# Patient Record
Sex: Female | Born: 1940 | Race: White | Hispanic: No | Marital: Married | Smoking: Never smoker
Health system: Southern US, Community
[De-identification: ages and names within clinical notes are randomized; demographics above are authoritative.]

## PROBLEM LIST (undated history)

## (undated) DIAGNOSIS — I4891 Unspecified atrial fibrillation: Secondary | ICD-10-CM

## (undated) DIAGNOSIS — H259 Unspecified age-related cataract: Secondary | ICD-10-CM

## (undated) DIAGNOSIS — C50919 Malignant neoplasm of unspecified site of unspecified female breast: Secondary | ICD-10-CM

## (undated) DIAGNOSIS — J302 Other seasonal allergic rhinitis: Secondary | ICD-10-CM

## (undated) DIAGNOSIS — I10 Essential (primary) hypertension: Secondary | ICD-10-CM

## (undated) DIAGNOSIS — M810 Age-related osteoporosis without current pathological fracture: Secondary | ICD-10-CM

## (undated) DIAGNOSIS — M1990 Unspecified osteoarthritis, unspecified site: Secondary | ICD-10-CM

## (undated) DIAGNOSIS — D649 Anemia, unspecified: Secondary | ICD-10-CM

## (undated) DIAGNOSIS — C801 Malignant (primary) neoplasm, unspecified: Secondary | ICD-10-CM

## (undated) DIAGNOSIS — M5030 Other cervical disc degeneration, unspecified cervical region: Secondary | ICD-10-CM

## (undated) DIAGNOSIS — I700 Atherosclerosis of aorta: Secondary | ICD-10-CM

## (undated) DIAGNOSIS — E785 Hyperlipidemia, unspecified: Secondary | ICD-10-CM

## (undated) DIAGNOSIS — I4819 Other persistent atrial fibrillation: Secondary | ICD-10-CM

## (undated) DIAGNOSIS — M48062 Spinal stenosis, lumbar region with neurogenic claudication: Secondary | ICD-10-CM

## (undated) DIAGNOSIS — R002 Palpitations: Secondary | ICD-10-CM

## (undated) DIAGNOSIS — I447 Left bundle-branch block, unspecified: Secondary | ICD-10-CM

## (undated) DIAGNOSIS — I38 Endocarditis, valve unspecified: Secondary | ICD-10-CM

## (undated) DIAGNOSIS — N183 Chronic kidney disease, stage 3 unspecified: Secondary | ICD-10-CM

## (undated) DIAGNOSIS — I341 Nonrheumatic mitral (valve) prolapse: Secondary | ICD-10-CM

## (undated) DIAGNOSIS — I5020 Unspecified systolic (congestive) heart failure: Secondary | ICD-10-CM

## (undated) DIAGNOSIS — I6789 Other cerebrovascular disease: Secondary | ICD-10-CM

## (undated) DIAGNOSIS — Z7901 Long term (current) use of anticoagulants: Secondary | ICD-10-CM

## (undated) DIAGNOSIS — Z79899 Other long term (current) drug therapy: Secondary | ICD-10-CM

## (undated) DIAGNOSIS — N3281 Overactive bladder: Secondary | ICD-10-CM

## (undated) HISTORY — PX: EYE SURGERY: SHX253

## (undated) HISTORY — PX: JOINT REPLACEMENT: SHX530

## (undated) HISTORY — PX: CHOLECYSTECTOMY: SHX55

## (undated) HISTORY — PX: ABDOMINAL HYSTERECTOMY: SHX81

## (undated) HISTORY — DX: Malignant neoplasm of unspecified site of unspecified female breast: C50.919

## (undated) HISTORY — PX: MASTECTOMY: SHX3

---

## 2006-08-09 ENCOUNTER — Ambulatory Visit: Payer: Self-pay | Admitting: Family Medicine

## 2010-12-04 ENCOUNTER — Ambulatory Visit: Payer: Self-pay | Admitting: Family Medicine

## 2010-12-05 ENCOUNTER — Ambulatory Visit: Payer: Self-pay | Admitting: Family Medicine

## 2010-12-15 ENCOUNTER — Ambulatory Visit: Payer: Self-pay | Admitting: Surgery

## 2010-12-22 LAB — PATHOLOGY REPORT

## 2010-12-23 ENCOUNTER — Ambulatory Visit: Payer: Self-pay | Admitting: Oncology

## 2010-12-25 ENCOUNTER — Ambulatory Visit: Payer: Self-pay | Admitting: Oncology

## 2011-01-10 ENCOUNTER — Ambulatory Visit: Payer: Self-pay | Admitting: Oncology

## 2011-01-13 ENCOUNTER — Ambulatory Visit: Payer: Self-pay | Admitting: Surgery

## 2011-01-14 ENCOUNTER — Ambulatory Visit: Payer: Self-pay | Admitting: Oncology

## 2011-02-09 ENCOUNTER — Ambulatory Visit: Payer: Self-pay | Admitting: Oncology

## 2011-03-12 ENCOUNTER — Ambulatory Visit: Payer: Self-pay | Admitting: Oncology

## 2011-04-11 ENCOUNTER — Ambulatory Visit: Payer: Self-pay | Admitting: Oncology

## 2011-05-12 ENCOUNTER — Ambulatory Visit: Payer: Self-pay | Admitting: Oncology

## 2011-05-13 LAB — CBC CANCER CENTER
Basophil #: 0 x10 3/mm (ref 0.0–0.1)
Basophil %: 0.9 %
Eosinophil #: 0.1 x10 3/mm (ref 0.0–0.7)
HCT: 27.3 % — ABNORMAL LOW (ref 35.0–47.0)
HGB: 9.5 g/dL — ABNORMAL LOW (ref 12.0–16.0)
MCH: 34.8 pg — ABNORMAL HIGH (ref 26.0–34.0)
MCHC: 34.7 g/dL (ref 32.0–36.0)
MCV: 100.4 fL — ABNORMAL HIGH (ref 80–100)
Monocyte #: 0.2 x10 3/mm (ref 0.0–0.7)
Neutrophil #: 1.8 x10 3/mm (ref 1.4–6.5)
RDW: 17.8 % — ABNORMAL HIGH (ref 11.5–14.5)

## 2011-05-13 LAB — COMPREHENSIVE METABOLIC PANEL
Albumin: 3.5 g/dL (ref 3.4–5.0)
Alkaline Phosphatase: 120 U/L (ref 50–136)
BUN: 9 mg/dL (ref 7–18)
Chloride: 102 mmol/L (ref 98–107)
Co2: 24 mmol/L (ref 21–32)
Glucose: 132 mg/dL — ABNORMAL HIGH (ref 65–99)
Potassium: 3.2 mmol/L — ABNORMAL LOW (ref 3.5–5.1)
SGOT(AST): 23 U/L (ref 15–37)
Sodium: 136 mmol/L (ref 136–145)
Total Protein: 6.6 g/dL (ref 6.4–8.2)

## 2011-05-20 LAB — CBC CANCER CENTER
Basophil %: 0.9 %
Eosinophil #: 0 x10 3/mm (ref 0.0–0.7)
Eosinophil %: 1.7 %
HCT: 26.5 % — ABNORMAL LOW (ref 35.0–47.0)
HGB: 9.1 g/dL — ABNORMAL LOW (ref 12.0–16.0)
Lymphocyte %: 16.8 %
MCHC: 34.3 g/dL (ref 32.0–36.0)
Neutrophil #: 1.8 x10 3/mm (ref 1.4–6.5)
Neutrophil %: 67.3 %
Platelet: 104 x10 3/mm — ABNORMAL LOW (ref 150–440)
RBC: 2.61 10*6/uL — ABNORMAL LOW (ref 3.80–5.20)
WBC: 2.5 x10 3/mm — ABNORMAL LOW (ref 3.6–11.0)

## 2011-05-27 LAB — CBC CANCER CENTER
Basophil #: 0 x10 3/mm (ref 0.0–0.1)
Eosinophil %: 0.8 %
HCT: 25.3 % — ABNORMAL LOW (ref 35.0–47.0)
Lymphocyte #: 0.5 x10 3/mm — ABNORMAL LOW (ref 1.0–3.6)
MCH: 35.4 pg — ABNORMAL HIGH (ref 26.0–34.0)
MCV: 100.8 fL — ABNORMAL HIGH (ref 80–100)
Monocyte %: 10.4 %
Neutrophil #: 2.1 x10 3/mm (ref 1.4–6.5)
Platelet: 103 x10 3/mm — ABNORMAL LOW (ref 150–440)
RDW: 17.2 % — ABNORMAL HIGH (ref 11.5–14.5)
WBC: 2.9 x10 3/mm — ABNORMAL LOW (ref 3.6–11.0)

## 2011-06-03 LAB — CBC CANCER CENTER
Basophil #: 0 x10 3/mm (ref 0.0–0.1)
Eosinophil %: 1.4 %
HCT: 27.7 % — ABNORMAL LOW (ref 35.0–47.0)
HGB: 9.5 g/dL — ABNORMAL LOW (ref 12.0–16.0)
Lymphocyte #: 0.5 x10 3/mm — ABNORMAL LOW (ref 1.0–3.6)
Lymphocyte %: 19 %
MCV: 99.9 fL (ref 80–100)
Monocyte %: 8.3 %
Neutrophil #: 1.9 x10 3/mm (ref 1.4–6.5)
Platelet: 138 x10 3/mm — ABNORMAL LOW (ref 150–440)
RBC: 2.77 10*6/uL — ABNORMAL LOW (ref 3.80–5.20)
WBC: 2.6 x10 3/mm — ABNORMAL LOW (ref 3.6–11.0)

## 2011-06-10 LAB — COMPREHENSIVE METABOLIC PANEL
Albumin: 3.5 g/dL (ref 3.4–5.0)
Alkaline Phosphatase: 131 U/L (ref 50–136)
Anion Gap: 13 (ref 7–16)
BUN: 15 mg/dL (ref 7–18)
Calcium, Total: 8.7 mg/dL (ref 8.5–10.1)
Chloride: 102 mmol/L (ref 98–107)
Co2: 24 mmol/L (ref 21–32)
EGFR (Non-African Amer.): 58 — ABNORMAL LOW
Potassium: 3.3 mmol/L — ABNORMAL LOW (ref 3.5–5.1)
SGOT(AST): 23 U/L (ref 15–37)

## 2011-06-10 LAB — CBC CANCER CENTER
Basophil #: 0 x10 3/mm (ref 0.0–0.1)
Basophil %: 1.2 %
Eosinophil #: 0 x10 3/mm (ref 0.0–0.7)
Eosinophil %: 2.1 %
HCT: 28.5 % — ABNORMAL LOW (ref 35.0–47.0)
HGB: 9.5 g/dL — ABNORMAL LOW (ref 12.0–16.0)
Lymphocyte #: 0.6 x10 3/mm — ABNORMAL LOW (ref 1.0–3.6)
Lymphocyte %: 24.2 %
MCHC: 33.3 g/dL (ref 32.0–36.0)
MCV: 101.2 fL — ABNORMAL HIGH (ref 80–100)
Monocyte #: 0.2 x10 3/mm (ref 0.0–0.7)
Neutrophil #: 1.6 x10 3/mm (ref 1.4–6.5)
Neutrophil %: 65.1 %
Platelet: 128 x10 3/mm — ABNORMAL LOW (ref 150–440)
RDW: 17.3 % — ABNORMAL HIGH (ref 11.5–14.5)
WBC: 2.4 x10 3/mm — ABNORMAL LOW (ref 3.6–11.0)

## 2011-06-12 ENCOUNTER — Ambulatory Visit: Payer: Self-pay | Admitting: Oncology

## 2011-06-17 LAB — COMPREHENSIVE METABOLIC PANEL
Albumin: 3.5 g/dL (ref 3.4–5.0)
Anion Gap: 10 (ref 7–16)
Bilirubin,Total: 0.6 mg/dL (ref 0.2–1.0)
Calcium, Total: 8.8 mg/dL (ref 8.5–10.1)
Chloride: 104 mmol/L (ref 98–107)
Co2: 26 mmol/L (ref 21–32)
Creatinine: 0.82 mg/dL (ref 0.60–1.30)
EGFR (African American): >60
Glucose: 135 mg/dL — ABNORMAL HIGH (ref 65–99)
Osmolality: 281 (ref 275–301)
Potassium: 3.9 mmol/L (ref 3.5–5.1)
SGOT(AST): 23 U/L (ref 15–37)
Sodium: 140 mmol/L (ref 136–145)
Total Protein: 6.7 g/dL (ref 6.4–8.2)

## 2011-06-17 LAB — CBC CANCER CENTER
Basophil #: 0 x10 3/mm (ref 0.0–0.1)
Basophil %: 1 %
HGB: 9.6 g/dL — ABNORMAL LOW (ref 12.0–16.0)
Lymphocyte #: 0.7 x10 3/mm — ABNORMAL LOW (ref 1.0–3.6)
MCV: 102.1 fL — ABNORMAL HIGH (ref 80–100)
Monocyte %: 10.7 %
Neutrophil #: 1.7 x10 3/mm (ref 1.4–6.5)
Platelet: 133 x10 3/mm — ABNORMAL LOW (ref 150–440)
RBC: 2.86 10*6/uL — ABNORMAL LOW (ref 3.80–5.20)
RDW: 18.4 % — ABNORMAL HIGH (ref 11.5–14.5)
WBC: 2.7 x10 3/mm — ABNORMAL LOW (ref 3.6–11.0)

## 2011-06-24 LAB — CBC CANCER CENTER
Basophil %: 1 %
Eosinophil #: 0 x10 3/mm (ref 0.0–0.7)
Eosinophil %: 1.3 %
HGB: 9.3 g/dL — ABNORMAL LOW (ref 12.0–16.0)
Lymphocyte #: 0.5 x10 3/mm — ABNORMAL LOW (ref 1.0–3.6)
Lymphocyte %: 19.2 %
MCH: 33 pg (ref 26.0–34.0)
MCV: 101.3 fL — ABNORMAL HIGH (ref 80–100)
Monocyte #: 0.2 x10 3/mm (ref 0.0–0.7)
Monocyte %: 8.2 %
Neutrophil #: 1.9 x10 3/mm (ref 1.4–6.5)
Neutrophil %: 70.3 %
RBC: 2.83 10*6/uL — ABNORMAL LOW (ref 3.80–5.20)
RDW: 17.6 % — ABNORMAL HIGH (ref 11.5–14.5)

## 2011-06-24 LAB — COMPREHENSIVE METABOLIC PANEL
Albumin: 3.5 g/dL (ref 3.4–5.0)
Alkaline Phosphatase: 132 U/L (ref 50–136)
Calcium, Total: 8.6 mg/dL (ref 8.5–10.1)
Creatinine: 0.88 mg/dL (ref 0.60–1.30)
Osmolality: 279 (ref 275–301)
Sodium: 139 mmol/L (ref 136–145)

## 2011-07-10 ENCOUNTER — Ambulatory Visit: Payer: Self-pay | Admitting: Oncology

## 2011-07-23 ENCOUNTER — Ambulatory Visit: Payer: Self-pay | Admitting: Surgery

## 2011-07-23 LAB — BASIC METABOLIC PANEL
BUN: 14 mg/dL (ref 7–18)
Creatinine: 0.69 mg/dL (ref 0.60–1.30)
EGFR (African American): >60
EGFR (Non-African Amer.): >60
Glucose: 93 mg/dL (ref 65–99)
Potassium: 3.8 mmol/L (ref 3.5–5.1)
Sodium: 139 mmol/L (ref 136–145)

## 2011-07-23 LAB — CBC WITH DIFFERENTIAL/PLATELET
MCH: 33 pg (ref 26.0–34.0)
MCHC: 33.5 g/dL (ref 32.0–36.0)
Monocyte #: 0.6 10*3/uL (ref 0.0–0.7)
Neutrophil %: 73.3 %
Platelet: 128 10*3/uL — ABNORMAL LOW (ref 150–440)
RDW: 16.5 % — ABNORMAL HIGH (ref 11.5–14.5)

## 2011-07-30 ENCOUNTER — Ambulatory Visit: Payer: Self-pay | Admitting: Surgery

## 2011-07-31 LAB — CBC WITH DIFFERENTIAL/PLATELET
Basophil #: 0 10*3/uL (ref 0.0–0.1)
Basophil #: 0 10*3/uL (ref 0.0–0.1)
Basophil %: 0.7 %
Eosinophil %: 4.3 %
Eosinophil %: 4.3 %
HCT: 25.9 % — ABNORMAL LOW (ref 35.0–47.0)
Lymphocyte #: 0.8 10*3/uL — ABNORMAL LOW (ref 1.0–3.6)
Lymphocyte %: 14.2 %
Monocyte #: 0.6 10*3/uL (ref 0.0–0.7)
Monocyte %: 11.4 %
Monocyte %: 12.3 %
Neutrophil #: 3.7 10*3/uL (ref 1.4–6.5)
Neutrophil #: 3.4 10*3/uL (ref 1.4–6.5)
Neutrophil %: 70.3 %
Platelet: 93 10*3/uL — ABNORMAL LOW (ref 150–440)
Platelet: 99 10*3/uL — ABNORMAL LOW (ref 150–440)
RBC: 2.56 10*6/uL — ABNORMAL LOW (ref 3.80–5.20)
RDW: 15.9 % — ABNORMAL HIGH (ref 11.5–14.5)
RDW: 15.4 % — ABNORMAL HIGH (ref 11.5–14.5)
WBC: 5.3 10*3/uL (ref 3.6–11.0)
WBC: 4.8 10*3/uL (ref 3.6–11.0)

## 2011-08-04 LAB — PATHOLOGY REPORT

## 2011-08-26 ENCOUNTER — Ambulatory Visit: Payer: Self-pay | Admitting: Oncology

## 2011-08-26 LAB — CBC CANCER CENTER
Basophil #: 0 x10 3/mm (ref 0.0–0.1)
Eosinophil #: 0.5 x10 3/mm (ref 0.0–0.7)
Eosinophil %: 10.4 %
Lymphocyte #: 0.8 x10 3/mm — ABNORMAL LOW (ref 1.0–3.6)
Lymphocyte %: 18 %
MCH: 31.9 pg (ref 26.0–34.0)
MCHC: 33.1 g/dL (ref 32.0–36.0)
MCV: 97 fL (ref 80–100)
Monocyte #: 0.4 x10 3/mm (ref 0.2–0.9)
Neutrophil #: 2.6 x10 3/mm (ref 1.4–6.5)
Neutrophil %: 60.9 %
RDW: 14.7 % — ABNORMAL HIGH (ref 11.5–14.5)

## 2011-08-26 LAB — COMPREHENSIVE METABOLIC PANEL
Alkaline Phosphatase: 197 U/L — ABNORMAL HIGH (ref 50–136)
BUN: 12 mg/dL (ref 7–18)
Bilirubin,Total: 0.5 mg/dL (ref 0.2–1.0)
Calcium, Total: 8.9 mg/dL (ref 8.5–10.1)
Co2: 26 mmol/L (ref 21–32)
EGFR (African American): >60
EGFR (Non-African Amer.): >60
Osmolality: 275 (ref 275–301)
Potassium: 3.5 mmol/L (ref 3.5–5.1)
SGOT(AST): 30 U/L (ref 15–37)
Sodium: 137 mmol/L (ref 136–145)

## 2011-09-09 ENCOUNTER — Ambulatory Visit: Payer: Self-pay | Admitting: Oncology

## 2011-10-10 ENCOUNTER — Ambulatory Visit: Payer: Self-pay | Admitting: Oncology

## 2011-11-18 ENCOUNTER — Ambulatory Visit: Payer: Self-pay | Admitting: Oncology

## 2011-12-10 ENCOUNTER — Ambulatory Visit: Payer: Self-pay | Admitting: Oncology

## 2012-01-10 ENCOUNTER — Ambulatory Visit: Payer: Self-pay | Admitting: Oncology

## 2012-02-10 ENCOUNTER — Ambulatory Visit: Payer: Self-pay | Admitting: Oncology

## 2012-03-02 IMAGING — NM NUCLEAR MEDICINE CARDIAC MULTIPLE UPTAKE GATED ACQUISITION SCAN
7 series · 27 of 27 positions shown · non-contrast
Comparison: none

REASON FOR EXAM: pre chemo  breast CA staging
COMMENTS:

[Series 1000: lao 45 · 3.30mm/px · 1 of 1 slices shown]
[im 1/1]
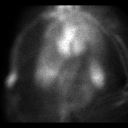

[Series 1000: ant-gated · 3.30mm/px · 6 of 24 frames shown]
[frame 3/24]
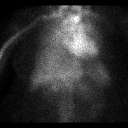
[frame 7/24]
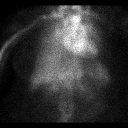
[frame 11/24]
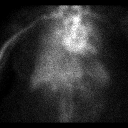
[frame 15/24]
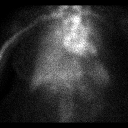
[frame 19/24]
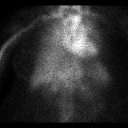
[frame 23/24]
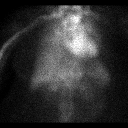

[Series 1000: ant · 3.30mm/px · 1 of 1 slices shown]
[im 1/1]
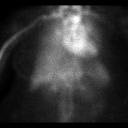

[Series 1000: lao 70-gated · 3.30mm/px · 6 of 24 frames shown]
[frame 3/24  full-range]
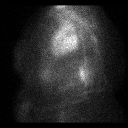
[frame 7/24  full-range]
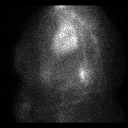
[frame 11/24  full-range]
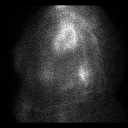
[frame 15/24  full-range]
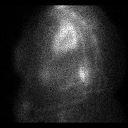
[frame 19/24  full-range]
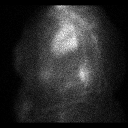
[frame 23/24  full-range]
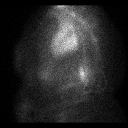

[Series 1000: lao 70 · 3.30mm/px · 1 of 1 slices shown]
[im 1/1]
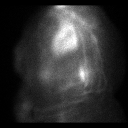

[Series 1000: lao 45-gated (results) · 3.30mm/px · 6 of 24 frames shown]
[frame 3/24]
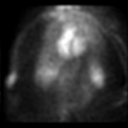
[frame 7/24]
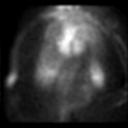
[frame 11/24]
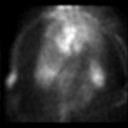
[frame 15/24]
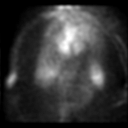
[frame 19/24]
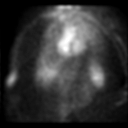
[frame 23/24]
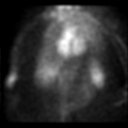

[Series 1000: lao 45-gated · 3.30mm/px · 6 of 24 frames shown]
[frame 3/24]
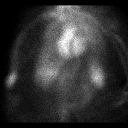
[frame 7/24]
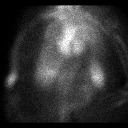
[frame 11/24]
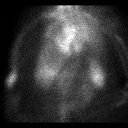
[frame 15/24]
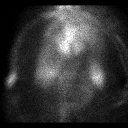
[frame 19/24]
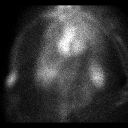
[frame 23/24]
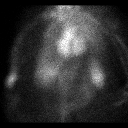

[27 of 27 positions shown; findings below may reference images not displayed]

PROCEDURE:     KNM - KNM REST MUGA SCAN [DATE] OF [DATE]  - [DATE] [DATE] [DATE]  [DATE]

RESULT:     Following intravenous administration of 24.52 mCi technetium 99m
pertechnetate and 3.0 mL PYP, rest MUGA scan was performed. Review of the
left ventricular cine loop shows no akinetic or dyskinetic myocardial
segments. The left ventricular ejection fraction measures 67.4%, which is in
the normal range.
IMPRESSION: 1.     Normal study. The left ventricular ejection fraction measures 67.4%.

## 2012-03-11 ENCOUNTER — Ambulatory Visit: Payer: Self-pay | Admitting: Oncology

## 2012-03-11 ENCOUNTER — Ambulatory Visit: Payer: Self-pay

## 2012-05-06 ENCOUNTER — Ambulatory Visit: Payer: Self-pay | Admitting: Oncology

## 2012-05-11 ENCOUNTER — Ambulatory Visit: Payer: Self-pay | Admitting: Oncology

## 2012-06-11 ENCOUNTER — Ambulatory Visit: Payer: Self-pay | Admitting: Oncology

## 2012-06-11 ENCOUNTER — Ambulatory Visit: Payer: Self-pay

## 2012-06-20 ENCOUNTER — Ambulatory Visit: Payer: Self-pay | Admitting: Surgery

## 2012-11-23 ENCOUNTER — Ambulatory Visit: Payer: Self-pay | Admitting: Oncology

## 2012-12-09 ENCOUNTER — Ambulatory Visit: Payer: Self-pay | Admitting: Oncology

## 2013-01-11 ENCOUNTER — Ambulatory Visit: Payer: Self-pay | Admitting: Oncology

## 2013-05-31 ENCOUNTER — Ambulatory Visit: Payer: Self-pay | Admitting: Oncology

## 2013-06-11 ENCOUNTER — Ambulatory Visit: Payer: Self-pay | Admitting: Oncology

## 2013-08-09 ENCOUNTER — Ambulatory Visit: Payer: Self-pay | Admitting: Ophthalmology

## 2013-08-09 DIAGNOSIS — Z01810 Encounter for preprocedural cardiovascular examination: Secondary | ICD-10-CM

## 2013-08-09 DIAGNOSIS — I499 Cardiac arrhythmia, unspecified: Secondary | ICD-10-CM

## 2013-08-21 ENCOUNTER — Ambulatory Visit: Payer: Self-pay | Admitting: Ophthalmology

## 2013-11-29 ENCOUNTER — Ambulatory Visit: Payer: Self-pay | Admitting: Oncology

## 2014-01-12 ENCOUNTER — Ambulatory Visit: Payer: Self-pay | Admitting: Oncology

## 2014-05-01 DIAGNOSIS — E7800 Pure hypercholesterolemia, unspecified: Secondary | ICD-10-CM | POA: Insufficient documentation

## 2014-05-01 DIAGNOSIS — Z9013 Acquired absence of bilateral breasts and nipples: Secondary | ICD-10-CM | POA: Insufficient documentation

## 2014-05-01 DIAGNOSIS — M170 Bilateral primary osteoarthritis of knee: Secondary | ICD-10-CM | POA: Insufficient documentation

## 2014-05-01 DIAGNOSIS — Z853 Personal history of malignant neoplasm of breast: Secondary | ICD-10-CM | POA: Insufficient documentation

## 2014-05-30 ENCOUNTER — Ambulatory Visit: Payer: Self-pay | Admitting: Oncology

## 2014-06-11 ENCOUNTER — Ambulatory Visit: Payer: Self-pay | Admitting: Oncology

## 2014-09-01 NOTE — Op Note (Signed)
PATIENT NAME:  Kristin Mitchell, Kristin Mitchell MR#:  [id number] DATE OF BIRTH:  15-Mar-1941  DATE OF PROCEDURE:  08/21/2013  PREOPERATIVE DIAGNOSIS:  Cataract, right eye.   POSTOPERATIVE DIAGNOSIS:  Cataract, right eye.  PROCEDURE PERFORMED:  Extracapsular cataract extraction using phacoemulsification with placement of an Alcon SN60WF, 22.5-diopter posterior chamber lens, serial # [id number].  SURGEON:  Loura Back. Shanik Brookshire, MD  ASSISTANT:  None.  ANESTHESIA:  4% lidocaine and 0.75% Marcaine in a 50/50 mixture with 10 units/mL of Hylenex added, given as a peribulbar.  ANESTHESIOLOGIST:  Dr. Benjamine Mola.  COMPLICATIONS:  None.  ESTIMATED BLOOD LOSS:  Less than 1 ml.  DESCRIPTION OF PROCEDURE:  The patient was brought to the operating room and given a peribulbar block.  The patient was then prepped and draped in the usual fashion.  The vertical rectus muscles were imbricated using 5-0 silk sutures.  These sutures were then clamped to the sterile drapes as bridle sutures.  A limbal peritomy was performed extending two clock hours and hemostasis was obtained with cautery.  A partial thickness scleral groove was made at the surgical limbus and dissected anteriorly in a lamellar dissection using an Alcon crescent knife.  The anterior chamber was entered superonasally with a Superblade and through the lamellar dissection with a 2.6 mm keratome.  DisCoVisc was used to replace the aqueous and a continuous tear capsulorrhexis was carried out.  Hydrodissection and hydrodelineation were carried out with balanced salt and a 27 gauge canula.  The nucleus was rotated to confirm the effectiveness of the hydrodissection.  Phacoemulsification was carried out using a divide-and-conquer technique.  Total ultrasound time was 2 minutes and 21 seconds with an average power of 27.5% with a CDE of 53.60.  Irrigation/aspiration was used to remove the residual cortex.  DisCoVisc was used to inflate the capsule and the internal incision  was enlarged to 3 mm with the crescent knife.  The intraocular lens was folded and inserted into the capsular bag using the AcrySert Delivery System.  Irrigation/aspiration was used to remove the residual DisCoVisc.  Miostat was injected into the anterior chamber through the paracentesis track to inflate the anterior chamber and induce miosis.  0.1 mL of Vigamox diluted containing 0.1 mg of drug was injected via the paracentesis tract. The wound was checked for leaks and none were found. The conjunctiva was closed with cautery and the bridle sutures were removed.  Two drops of 0.3% Vigamox were placed on the eye.   An eye shield was placed on the eye.  The patient was discharged to the recovery room in good condition.   ____________________________ Loura Back Eryca Bolte, MD sad:dmm D: 08/21/2013 12:17:31 ET T: 08/21/2013 12:36:07 ET JOB#: [id number]  cc: Remo Lipps A. Renie Stelmach, MD, <Dictator> Martie Lee MD ELECTRONICALLY SIGNED 08/28/2013 13:06

## 2014-09-02 NOTE — H&P (Signed)
PATIENT NAME:  Kristin Mitchell, Kristin Mitchell MR#:  [id number] DATE OF BIRTH:  1940/06/02  DATE OF ADMISSION:  07/30/2011  CHIEF COMPLAINT: Right breast cancer.   HISTORY OF PRESENT ILLNESS: This is a patient here for elective bilateral total mastectomies with right axillary node dissection. This patient was diagnosed with an aggressive multifocal breast cancer in August of 2012. She has had a port placed and underwent neoadjuvant chemotherapy by Dr. Grayland Ormond. Dr. Grayland Ormond has sent the patient back for surgical excision utilizing bilateral mastectomies. He and I have discussed the options of sentinel node biopsy versus axillary dissection and this patient has needle-biopsy-proven node positive disease in two separate lymph nodes and; therefore, axillary dissection was decided upon by Dr. Grayland Ormond and myself.  PAST MEDICAL HISTORY: Breast cancer on the right. She has no new medical problems. She denies any problems with her heart or lungs and tolerated chemotherapy well. She did lose her hair, but has otherwise had no problems during chemotherapy.   PAST SURGICAL HISTORY:  1. Port placement. 2. Right breast biopsy. 3. Axillary biopsy at [facility]   ALLERGIES: Penicillin years ago causing a rash.   MEDICATIONS: See office chart.   FAMILY HISTORY: Noncontributory.   SOCIAL HISTORY: She does not smoke or drink and is retired.   REVIEW OF SYSTEMS: A 10 system review has been performed and documented in the office chart and negative with the exception of that mentioned in the history of present illness.     PHYSICAL EXAMINATION:   GENERAL: Obese female patient.   VITALS: Temperature 97.9, pulse 72, blood pressure 124/72.   HEENT: No scleral icterus. She is alopecic.   NECK: No palpable neck nodes.   CHEST: Clear to auscultation.   CARDIAC: Regular rate and rhythm.   ABDOMEN: Soft and nontender.   EXTREMITIES: No edema. Calves are nontender.   NEUROLOGIC: Grossly intact.    INTEGUMENT: No jaundice.   BREASTS: Examination shows no mass in either breast. She has rather large breasts and an obese female.   NECK: No palpable axillary nodes on either side.  DATA: A review of her chart failed to identify the [facility] pathology report from the lymph nodes. Dr. Grayland Ormond states that it was N1 disease and that he would forward those results to me.  ASSESSMENT AND PLAN: The plan is for bilateral total mastectomies. Dr. Grayland Ormond and I have discussed options of sentinel node versus axillary dissection and, because she was positive prechemotherapy, and this is a fairly aggressive multifocal cancer, it was decided that she would benefit best from a full axillary dissection, on the right side.   We discussed these options and rationale and they are in agreement with bilateral total mastectomies, right axillary node dissection. The options of observation and alternative therapies have been discussed. The need for radiation therapy was discussed and she has already seen Dr. Baruch Gouty. We have also discussed the risks of bleeding, infection, cosmetic deformity including dog ears, lateral and medial, and the potential for reconstruction at any time should she choose, but she does not want to have reconstruction at this time. We also discussed the possibility of transfusion with its risks in detail. Her hemoglobin is starting out at 10 and 30 and would not likely require that, and she has a normal platelet count. We also discussed about lymphedema and the fact that she is having a full dissection, she has noted positive disease and will require radiotherapy afterwards as well, and there is a strong possibility that  she could end up with some amount of lymphedema or even sensorimotor nerve loss from the axillary dissection and radiotherapy. She understood all this. The treatment for her lymphedema was discussed with her and the somewhat permanence of it was discussed. She and her husband had  multiple questions which were answered. They understood and agreed to proceed. ____________________________ Jerrol Banana Burt Knack, MD rec:slb D: 07/29/2011 09:42:44 ET T: 07/29/2011 10:30:11 ET JOB#: [id number]  cc: Jerrol Banana. Burt Knack, MD, <Dictator> Florene Glen MD ELECTRONICALLY SIGNED 07/30/2011 6:59

## 2014-09-02 NOTE — Op Note (Signed)
PATIENT NAME:  Kristin Mitchell, Kristin Mitchell MR#:  [id number] DATE OF BIRTH:  11-04-1940  DATE OF PROCEDURE:  07/30/2011  PREOPERATIVE DIAGNOSIS: Right breast cancer.   POSTOPERATIVE DIAGNOSIS: Right breast cancer.   PROCEDURES:  1. Bilateral total mastectomies.  2. Right axillary node dissection.   SURGEON: Lesslie Mckeehan E. Burt Knack, MD   ASSISTANT: Dia Crawford, MD   ANESTHESIA: General with endotracheal tube.   INDICATIONS: This is a patient with right breast cancer. She has undergone neoadjuvant chemotherapy and is here for elective total mastectomy with right axillary node dissection.   Preoperatively, Dr. Grayland Ormond and I and the patient and family have discussed the options of observation versus surgery versus additional therapy and we have discussed the potential for sentinel node biopsy versus axillary dissection and we have chosen axillary dissection because of the aggressive nature of the disease. We discussed the risks of bleeding, infection, hematoma, seroma, cosmetic deformity including dog-ears medial and lateral, the use of drains and the risks of lymphedema or temporary or permanent sensorimotor nerve loss. This was all reviewed for her and her family in the preop holding area. They understood and agreed to proceed.   FINDINGS: Small palpable lymph nodes in the right axilla.  SPECIMENS SENT: Three specimens sent, additional right axillary contents, right mastectomy with some axillary contents attached, and left total mastectomy.   DESCRIPTION OF PROCEDURE: The patient was induced to general anesthesia, given IV antibiotics. VTE prophylaxis was in place. She was prepped and draped in a sterile fashion. She had been marked on the right side for node dissection. Once she was prepped and draped, symmetrically drawn out lenticular-shaped incisions were made on the right and left side to encompass the nipple areolar complex and to allow for adequate closure of the wounds.   Starting on the left side in  the noncancerous side, the lenticular-shaped incisions were executed with sharp dissection as well as electrocautery dissection. Hemostasis was with the use of electrocautery and/or clips and then taking the specimen off of the pectoralis fascia medial to lateral utilizing electrocautery and clips was performed and the specimen was sent off for examination labeled left mastectomy.   Once assuring that hemostasis was adequate, the area was irrigated with copious amounts of normal saline. Again, hemostasis was adequate and through a separate incision a drain was placed, 10 mm JP, and held in with 3-0 nylon and then deep sutures of figure-of-eight 2-0 Vicryl were placed followed by 4-0 subcuticular Monocryl.   Attention was turned to the right side where a similar lenticular-shaped incision was performed elevating flaps in a standard fashion with electrocautery and taking the specimen from the pectoralis fascia medial to lateral utilizing clips or electrocautery.   The specimen was carried up to the axilla and then sent off for examination.   Attention was turned to the right axilla where the axillary packet was opened and node bearing tissue was elevated with blunt dissection. The axillary vein was identified as was the thoracodorsal neurovascular bundle which was protected and the lateral thoracic nerve was identified and kept in view at all times.   Hemostasis in this area was with clips. No electrocautery was used in the axilla.   Once assuring that there were no further palpable nodes or suspicious nodes and the wound had been checked for hemostasis, the area was irrigated with copious amounts of normal saline. No bleeding was noted.   Two separate incisions were placed, two JP drains 10 mm, one into the axilla and one in  the subcutaneous flap, which were held in with 3-0 nylons. Again, hemostasis was checked and found to be adequate. Then the wound was closed with Vicryls and Monocryls in a similar  fashion and the drains were placed to bulb suction on each side. Mastisol and Steri-Strips were placed followed by sterile dressings.   The patient tolerated this procedure well. There were no complications. She was taken to the recovery room in stable condition to be admitted for continued care. Sponge, lap, and needle count was correct.   ____________________________ Jerrol Banana. Burt Knack, MD rec:drc D: 07/30/2011 10:57:16 ET T: 07/30/2011 11:15:12 ET JOB#: [id number]  cc: Jerrol Banana. Burt Knack, MD, <Dictator> Florene Glen MD ELECTRONICALLY SIGNED 07/31/2011 6:46

## 2014-10-01 ENCOUNTER — Other Ambulatory Visit: Payer: Self-pay | Admitting: Oncology

## 2014-10-01 DIAGNOSIS — C50911 Malignant neoplasm of unspecified site of right female breast: Secondary | ICD-10-CM

## 2014-11-01 ENCOUNTER — Other Ambulatory Visit: Payer: Self-pay | Admitting: *Deleted

## 2014-11-01 DIAGNOSIS — C50911 Malignant neoplasm of unspecified site of right female breast: Secondary | ICD-10-CM

## 2014-11-01 MED ORDER — LETROZOLE 2.5 MG PO TABS: 2.5000 mg | ORAL_TABLET | Freq: Every day | ORAL | Status: DC

## 2014-11-20 ENCOUNTER — Encounter
Admission: RE | Admit: 2014-11-20 | Discharge: 2014-11-20 | Disposition: A | Discharge status: Home / Self Care | Payer: Medicare Other | Source: Ambulatory Visit | Attending: Ophthalmology | Admitting: Ophthalmology

## 2014-11-20 DIAGNOSIS — H2512 Age-related nuclear cataract, left eye: Secondary | ICD-10-CM | POA: Insufficient documentation

## 2014-11-20 DIAGNOSIS — Z01810 Encounter for preprocedural cardiovascular examination: Secondary | ICD-10-CM | POA: Insufficient documentation

## 2014-11-20 DIAGNOSIS — I499 Cardiac arrhythmia, unspecified: Secondary | ICD-10-CM | POA: Diagnosis not present

## 2014-11-21 ENCOUNTER — Encounter: Payer: Self-pay | Admitting: *Deleted

## 2014-11-21 DIAGNOSIS — Z79899 Other long term (current) drug therapy: Secondary | ICD-10-CM | POA: Diagnosis not present

## 2014-11-21 DIAGNOSIS — Z9049 Acquired absence of other specified parts of digestive tract: Secondary | ICD-10-CM | POA: Diagnosis not present

## 2014-11-21 DIAGNOSIS — Z90710 Acquired absence of both cervix and uterus: Secondary | ICD-10-CM | POA: Diagnosis not present

## 2014-11-21 DIAGNOSIS — Z9849 Cataract extraction status, unspecified eye: Secondary | ICD-10-CM | POA: Diagnosis not present

## 2014-11-21 DIAGNOSIS — Z880 Allergy status to penicillin: Secondary | ICD-10-CM | POA: Diagnosis not present

## 2014-11-21 DIAGNOSIS — H2512 Age-related nuclear cataract, left eye: Secondary | ICD-10-CM | POA: Diagnosis not present

## 2014-11-21 DIAGNOSIS — Z853 Personal history of malignant neoplasm of breast: Secondary | ICD-10-CM | POA: Diagnosis not present

## 2014-11-21 DIAGNOSIS — R002 Palpitations: Secondary | ICD-10-CM | POA: Diagnosis not present

## 2014-11-21 DIAGNOSIS — Z9013 Acquired absence of bilateral breasts and nipples: Secondary | ICD-10-CM | POA: Diagnosis not present

## 2014-11-21 DIAGNOSIS — M1990 Unspecified osteoarthritis, unspecified site: Secondary | ICD-10-CM | POA: Diagnosis not present

## 2014-11-21 DIAGNOSIS — H269 Unspecified cataract: Secondary | ICD-10-CM | POA: Diagnosis present

## 2014-11-23 ENCOUNTER — Inpatient Hospital Stay: Payer: Medicare Other | Attending: Oncology | Admitting: Family Medicine

## 2014-11-23 ENCOUNTER — Inpatient Hospital Stay: Payer: Medicare Other

## 2014-11-23 ENCOUNTER — Other Ambulatory Visit: Payer: Self-pay | Admitting: *Deleted

## 2014-11-23 VITALS — BP 169/73 | HR 80 | Temp 97.3°F | Wt 193.1 lb

## 2014-11-23 DIAGNOSIS — C50919 Malignant neoplasm of unspecified site of unspecified female breast: Secondary | ICD-10-CM | POA: Diagnosis present

## 2014-11-23 DIAGNOSIS — Z170 Estrogen receptor positive status [ER+]: Secondary | ICD-10-CM | POA: Diagnosis not present

## 2014-11-23 DIAGNOSIS — Z9013 Acquired absence of bilateral breasts and nipples: Secondary | ICD-10-CM | POA: Insufficient documentation

## 2014-11-23 DIAGNOSIS — Z79811 Long term (current) use of aromatase inhibitors: Secondary | ICD-10-CM | POA: Insufficient documentation

## 2014-11-23 DIAGNOSIS — C50911 Malignant neoplasm of unspecified site of right female breast: Secondary | ICD-10-CM

## 2014-11-23 DIAGNOSIS — M1990 Unspecified osteoarthritis, unspecified site: Secondary | ICD-10-CM | POA: Insufficient documentation

## 2014-11-23 LAB — BASIC METABOLIC PANEL
Anion gap: 12 (ref 5–15)
BUN: 15 mg/dL (ref 6–20)
CO2: 27 mmol/L (ref 22–32)
CREATININE: 0.8 mg/dL (ref 0.44–1.00)
Calcium: 9.5 mg/dL (ref 8.9–10.3)
Chloride: 96 mmol/L — ABNORMAL LOW (ref 101–111)
GFR calc non Af Amer: >60 mL/min (ref >60)
GLUCOSE: 76 mg/dL (ref 65–99)
POTASSIUM: 4.2 mmol/L (ref 3.5–5.1)
Sodium: 135 mmol/L (ref 135–145)

## 2014-11-23 LAB — CBC
HCT: 37.8 % (ref 35.0–47.0)
Hemoglobin: 12.8 g/dL (ref 12.0–16.0)
MCH: 31.2 pg (ref 26.0–34.0)
MCHC: 34 g/dL (ref 32.0–36.0)
MCV: 91.9 fL (ref 80.0–100.0)
PLATELETS: 122 10*3/uL — AB (ref 150–440)
RBC: 4.11 MIL/uL (ref 3.80–5.20)
RDW: 13.2 % (ref 11.5–14.5)
WBC: 4.2 10*3/uL (ref 3.6–11.0)

## 2014-11-23 NOTE — Progress Notes (Signed)
[place]  Telephone:([phone]) [phone]  [phone]     Kristin Mitchell DOB: 10/03/1940  MR#: [id number]  HQI#:[id number]  Patient Care Team: Sherrin Daisy, MD as PCP - General (Family Medicine)  CHIEF COMPLAINT:  Patient returns to clinic for continued follow-up regarding breast cancer, stage IIB, ER/PR positive, HER-2 negative.  INTERVAL HISTORY:  Patient returns to clinic today for routine six-month evaluation.  She continues to tolerate letrozole without significant side effects. She currently feels well and is asymptomatic. She has no neurologic complaints.  She denies any weakness or fatigue.  She denies any fevers.   She denies any easy bleeding or bruising. She has no chest pain or cough.  She has fair appetite and denies weight loss.  She has no bony pain.  She denies any nausea, vomiting, constipation, or diarrhea.  She has no urinary complaints.  Patient offers no specific complaints today.  REVIEW OF SYSTEMS:   Review of Systems  Constitutional: Negative for fever, chills, weight loss, malaise/fatigue and diaphoresis.  HENT: Negative for congestion, ear discharge, ear pain, hearing loss, nosebleeds, sore throat and tinnitus.   Eyes: Negative for blurred vision, double vision, photophobia, pain, discharge and redness.  Respiratory: Negative for cough, hemoptysis, sputum production, shortness of breath, wheezing and stridor.   Cardiovascular: Negative for chest pain, palpitations, orthopnea, claudication, leg swelling and PND.  Gastrointestinal: Negative for heartburn, nausea, vomiting, abdominal pain, diarrhea, constipation, blood in stool and melena.  Genitourinary: Negative.   Musculoskeletal: Negative.   Skin: Negative.   Neurological: Negative for dizziness, tingling, focal weakness, seizures, weakness and headaches.  Endo/Heme/Allergies: Does not bruise/bleed easily.  Psychiatric/Behavioral: Negative for depression. The patient is not  nervous/anxious and does not have insomnia.     As per HPI. Otherwise, a complete review of systems is negatve.  ONCOLOGY HISTORY:  No history exists.    PAST MEDICAL HISTORY: Past Medical History  Diagnosis Date  . Intermittent palpitations     in early am  . Cancer     breast  . Arthritis     PAST SURGICAL HISTORY: Past Surgical History  Procedure Laterality Date  . Abdominal hysterectomy    . Mastectomy      bilateral  . Cholecystectomy    . Eye surgery      FAMILY HISTORY No family history on file.  GYNECOLOGIC HISTORY:  No LMP recorded.     ADVANCED DIRECTIVES:    HEALTH MAINTENANCE: History  Substance Use Topics  . Smoking status: Never Smoker   . Smokeless tobacco: Not on file  . Alcohol Use: No     Colonoscopy:  PAP:  Bone density:  Lipid panel:  Not on File  Current Outpatient Prescriptions  Medication Sig Dispense Refill  . letrozole (FEMARA) 2.5 MG tablet Take 1 tablet (2.5 mg total) by mouth daily. 30 tablet 6   No current facility-administered medications for this visit.    OBJECTIVE: There were no vitals taken for this visit.   There is no weight on file to calculate BMI.    ECOG FS:0 - Asymptomatic  General: Well-developed, well-nourished, no acute distress. Eyes: Pink conjunctiva, anicteric sclera. HEENT: Normocephalic, moist mucous membranes, clear oropharnyx. Lungs: Clear to auscultation bilaterally. Heart: Regular rate and rhythm. No rubs, murmurs, or gallops. Abdomen: Soft, nontender, nondistended. No organomegaly noted, normoactive bowel sounds. Breast: Bilateral mastectomy, chest wall palpated no masses or fullness palpated.  Axilla palpated in both positions with no masses or fullness palpated.  Musculoskeletal: No edema,  cyanosis, or clubbing. Neuro: Alert, answering all questions appropriately. Cranial nerves grossly intact. Skin: No rashes or petechiae noted. Psych: Normal affect. Lymphatics: No cervical, calvicular,  axillary or inguinal LAD.   LAB RESULTS:  Office Visit on 11/23/2014  Component Date Value Ref Range Status  . WBC 11/23/2014 4.2  3.6 - 11.0 K/uL Final  . RBC 11/23/2014 4.11  3.80 - 5.20 MIL/uL Final  . Hemoglobin 11/23/2014 12.8  12.0 - 16.0 g/dL Final  . HCT 11/23/2014 37.8  35.0 - 47.0 % Final  . MCV 11/23/2014 91.9  80.0 - 100.0 fL Final  . MCH 11/23/2014 31.2  26.0 - 34.0 pg Final  . MCHC 11/23/2014 34.0  32.0 - 36.0 g/dL Final  . RDW 11/23/2014 13.2  11.5 - 14.5 % Final  . Platelets 11/23/2014 122* 150 - 440 K/uL Final  . Sodium 11/23/2014 135  135 - 145 mmol/L Final  . Potassium 11/23/2014 4.2  3.5 - 5.1 mmol/L Final  . Chloride 11/23/2014 96* 101 - 111 mmol/L Final  . CO2 11/23/2014 27  22 - 32 mmol/L Final  . Glucose, Bld 11/23/2014 76  65 - 99 mg/dL Final  . BUN 11/23/2014 15  6 - 20 mg/dL Final  . Creatinine, Ser 11/23/2014 0.80  0.44 - 1.00 mg/dL Final  . Calcium 11/23/2014 9.5  8.9 - 10.3 mg/dL Final  . GFR calc non Af Amer 11/23/2014 >60  >60 mL/min Final  . GFR calc Af Amer 11/23/2014 >60  >60 mL/min Final   Comment: (NOTE) The eGFR has been calculated using the CKD EPI equation. This calculation has not been validated in all clinical situations. eGFR's persistently <60 mL/min signify possible Chronic Kidney Disease.   . Anion gap 11/23/2014 12  5 - 15 Final    STUDIES: No results found.  ASSESSMENT:  Breast cancer, stage IIB ER/PR positive  PLAN:  1. Breast cancer. Clinically there is no evidence of recurrent disease. Patient has had previous bilateral mastectomy and therefore does not require any further mammograms. She is currently taking letrozole, calcium, and vitamin D and tolerating well. She will complete 5 years of letrozole in April 2018. Patient has been advised to schedule a bone density test in the fall of 2016 that she is refusing at this time. Last scheduled bone density was in September 2015 and revealed a T score of -2.4 which was  unchanged. Advised patient to continue with follow-up in 6 months.  Patient expressed understanding and was in agreement with this plan. She also understands that She can call clinic at any time with any questions, concerns, or complaints.   Dr. Oliva Bustard was available for consultation and review of plan of care for this patient.    Evlyn Kanner, NP   11/23/2014 11:17 AM

## 2014-11-24 LAB — CA 27.29 (SERIAL MONITOR): CA 27.29: 27.8 U/mL (ref 0.0–38.6)

## 2014-11-26 ENCOUNTER — Ambulatory Visit: Payer: Medicare Other | Admitting: Anesthesiology

## 2014-11-26 ENCOUNTER — Ambulatory Visit
Admission: RE | Admit: 2014-11-26 | Discharge: 2014-11-26 | Disposition: A | Discharge status: Home / Self Care | Payer: Medicare Other | Source: Ambulatory Visit | Attending: Ophthalmology | Admitting: Ophthalmology

## 2014-11-26 ENCOUNTER — Encounter: Payer: Self-pay | Admitting: *Deleted

## 2014-11-26 ENCOUNTER — Encounter
Admission: RE | Disposition: A | Discharge status: Home / Self Care | Payer: Self-pay | Source: Ambulatory Visit | Attending: Ophthalmology

## 2014-11-26 DIAGNOSIS — Z853 Personal history of malignant neoplasm of breast: Secondary | ICD-10-CM | POA: Insufficient documentation

## 2014-11-26 DIAGNOSIS — Z9013 Acquired absence of bilateral breasts and nipples: Secondary | ICD-10-CM | POA: Insufficient documentation

## 2014-11-26 DIAGNOSIS — Z880 Allergy status to penicillin: Secondary | ICD-10-CM | POA: Insufficient documentation

## 2014-11-26 DIAGNOSIS — C50911 Malignant neoplasm of unspecified site of right female breast: Secondary | ICD-10-CM

## 2014-11-26 DIAGNOSIS — Z90710 Acquired absence of both cervix and uterus: Secondary | ICD-10-CM | POA: Insufficient documentation

## 2014-11-26 DIAGNOSIS — Z79899 Other long term (current) drug therapy: Secondary | ICD-10-CM | POA: Insufficient documentation

## 2014-11-26 DIAGNOSIS — M1990 Unspecified osteoarthritis, unspecified site: Secondary | ICD-10-CM | POA: Insufficient documentation

## 2014-11-26 DIAGNOSIS — R002 Palpitations: Secondary | ICD-10-CM | POA: Insufficient documentation

## 2014-11-26 DIAGNOSIS — C50919 Malignant neoplasm of unspecified site of unspecified female breast: Secondary | ICD-10-CM

## 2014-11-26 DIAGNOSIS — C50411 Malignant neoplasm of upper-outer quadrant of right female breast: Secondary | ICD-10-CM | POA: Insufficient documentation

## 2014-11-26 DIAGNOSIS — H2512 Age-related nuclear cataract, left eye: Secondary | ICD-10-CM | POA: Insufficient documentation

## 2014-11-26 DIAGNOSIS — Z9849 Cataract extraction status, unspecified eye: Secondary | ICD-10-CM | POA: Insufficient documentation

## 2014-11-26 DIAGNOSIS — Z9049 Acquired absence of other specified parts of digestive tract: Secondary | ICD-10-CM | POA: Insufficient documentation

## 2014-11-26 HISTORY — PX: CATARACT EXTRACTION W/PHACO: SHX586

## 2014-11-26 HISTORY — DX: Malignant neoplasm of unspecified site of unspecified female breast: C50.919

## 2014-11-26 HISTORY — DX: Palpitations: R00.2

## 2014-11-26 HISTORY — DX: Malignant neoplasm of unspecified site of right female breast: C50.911

## 2014-11-26 HISTORY — DX: Malignant (primary) neoplasm, unspecified: C80.1

## 2014-11-26 HISTORY — DX: Unspecified osteoarthritis, unspecified site: M19.90

## 2014-11-26 SURGERY — PHACOEMULSIFICATION, CATARACT, WITH IOL INSERTION
Anesthesia: Monitor Anesthesia Care | Site: Eye | Laterality: Left | Wound class: Clean

## 2014-11-26 MED ORDER — TETRACAINE HCL 0.5 % OP SOLN
OPHTHALMIC | Status: AC
  Filled 2014-11-26: qty 2

## 2014-11-26 MED ORDER — CEFUROXIME OPHTHALMIC INJECTION 1 MG/0.1 ML
INJECTION | OPHTHALMIC | Status: AC
  Filled 2014-11-26: qty 0.1

## 2014-11-26 MED ORDER — HYALURONIDASE HUMAN 150 UNIT/ML IJ SOLN
INTRAMUSCULAR | Status: AC
  Filled 2014-11-26: qty 1

## 2014-11-26 MED ORDER — LIDOCAINE HCL (PF) 4 % IJ SOLN
INTRAMUSCULAR | Status: DC | PRN
  Administered 2014-11-26: 5 mL via OPHTHALMIC

## 2014-11-26 MED ORDER — NA CHONDROIT SULF-NA HYALURON 40-17 MG/ML IO SOLN
INTRAOCULAR | Status: AC
  Filled 2014-11-26: qty 1

## 2014-11-26 MED ORDER — MOXIFLOXACIN HCL 0.5 % OP SOLN
1.0000 [drp] | OPHTHALMIC | Status: AC | PRN
  Administered 2014-11-26 (×3): 1 [drp] via OPHTHALMIC

## 2014-11-26 MED ORDER — SODIUM CHLORIDE 0.9 % IV SOLN
INTRAVENOUS | Status: DC
  Administered 2014-11-26: 10:00:00 via INTRAVENOUS

## 2014-11-26 MED ORDER — CYCLOPENTOLATE HCL 2 % OP SOLN
OPHTHALMIC | Status: AC
  Administered 2014-11-26: 1 [drp] via OPHTHALMIC
  Filled 2014-11-26: qty 2

## 2014-11-26 MED ORDER — PHENYLEPHRINE HCL 10 % OP SOLN
OPHTHALMIC | Status: AC
  Administered 2014-11-26: 1 [drp] via OPHTHALMIC
  Filled 2014-11-26: qty 5

## 2014-11-26 MED ORDER — NA CHONDROIT SULF-NA HYALURON 40-17 MG/ML IO SOLN
INTRAOCULAR | Status: DC | PRN
  Administered 2014-11-26: 1 mL via INTRAOCULAR

## 2014-11-26 MED ORDER — MOXIFLOXACIN HCL 0.5 % OP SOLN
OPHTHALMIC | Status: DC | PRN
Start: 2014-11-26 — End: 2014-11-26
  Administered 2014-11-26: 1 [drp] via OPHTHALMIC

## 2014-11-26 MED ORDER — LIDOCAINE HCL (PF) 4 % IJ SOLN
INTRAMUSCULAR | Status: AC
  Filled 2014-11-26: qty 5

## 2014-11-26 MED ORDER — CYCLOPENTOLATE HCL 2 % OP SOLN
1.0000 [drp] | OPHTHALMIC | Status: AC | PRN
  Administered 2014-11-26 (×4): 1 [drp] via OPHTHALMIC

## 2014-11-26 MED ORDER — PHENYLEPHRINE HCL 10 % OP SOLN
1.0000 [drp] | OPHTHALMIC | Status: AC | PRN
  Administered 2014-11-26 (×4): 1 [drp] via OPHTHALMIC

## 2014-11-26 MED ORDER — CARBACHOL 0.01 % IO SOLN
INTRAOCULAR | Status: DC | PRN
  Administered 2014-11-26: 0.5 mL via INTRAOCULAR

## 2014-11-26 MED ORDER — EPINEPHRINE HCL 1 MG/ML IJ SOLN
INTRAMUSCULAR | Status: AC
  Filled 2014-11-26: qty 2

## 2014-11-26 MED ORDER — MOXIFLOXACIN HCL 0.5 % OP SOLN
OPHTHALMIC | Status: AC
  Administered 2014-11-26: 1 [drp] via OPHTHALMIC
  Filled 2014-11-26: qty 3

## 2014-11-26 MED ORDER — BUPIVACAINE HCL (PF) 0.75 % IJ SOLN
INTRAMUSCULAR | Status: AC
  Filled 2014-11-26: qty 10

## 2014-11-26 MED ORDER — LIDOCAINE HCL (PF) 1 % IJ SOLN
INTRAOCULAR | Status: DC | PRN
  Administered 2014-11-26: 4 mL via OPHTHALMIC

## 2014-11-26 MED ORDER — TETRACAINE HCL 0.5 % OP SOLN
OPHTHALMIC | Status: DC | PRN
  Administered 2014-11-26: 1 [drp] via OPHTHALMIC

## 2014-11-26 MED ORDER — ALFENTANIL 500 MCG/ML IJ INJ
INJECTION | INTRAMUSCULAR | Status: DC | PRN
  Administered 2014-11-26: 1000 ug via INTRAVENOUS

## 2014-11-26 MED ORDER — EPINEPHRINE HCL 1 MG/ML IJ SOLN
INTRAOCULAR | Status: DC | PRN
  Administered 2014-11-26: 200 mL

## 2014-11-26 SURGICAL SUPPLY — 25 items
Acrysof ×3 IMPLANT
CORD BIP STRL DISP 12FT (MISCELLANEOUS) ×3 IMPLANT
DRAPE XRAY CASSETTE 23X24 (DRAPES) ×3 IMPLANT
ERASER HMR WETFIELD 18G (MISCELLANEOUS) ×3 IMPLANT
GLOVE BIO SURGEON STRL SZ8 (GLOVE) ×3 IMPLANT
GLOVE SURG LX 6.5 MICRO (GLOVE) ×2
GLOVE SURG LX 8.0 MICRO (GLOVE) ×2
GLOVE SURG LX STRL 6.5 MICRO (GLOVE) ×1 IMPLANT
GLOVE SURG LX STRL 8.0 MICRO (GLOVE) ×1 IMPLANT
GOWN STRL REUS W/ TWL LRG LVL3 (GOWN DISPOSABLE) ×1 IMPLANT
GOWN STRL REUS W/ TWL XL LVL3 (GOWN DISPOSABLE) ×1 IMPLANT
GOWN STRL REUS W/TWL LRG LVL3 (GOWN DISPOSABLE) ×2
GOWN STRL REUS W/TWL XL LVL3 (GOWN DISPOSABLE) ×2
LENS IOL ACRYSERT 22.0 (Intraocular Lens) ×3 IMPLANT
PACK CATARACT (MISCELLANEOUS) ×3 IMPLANT
PACK CATARACT DINGLEDEIN LX (MISCELLANEOUS) ×3 IMPLANT
PACK EYE AFTER SURG (MISCELLANEOUS) ×3 IMPLANT
SHLD EYE VISITEC  UNIV (MISCELLANEOUS) ×3 IMPLANT
SOL PREP PVP 2OZ (MISCELLANEOUS) ×3
SOLUTION PREP PVP 2OZ (MISCELLANEOUS) ×1 IMPLANT
SUT SILK 5-0 (SUTURE) ×3 IMPLANT
SYR 5ML LL (SYRINGE) ×3 IMPLANT
SYR TB 1ML 27GX1/2 LL (SYRINGE) ×3 IMPLANT
WATER STERILE IRR 1000ML POUR (IV SOLUTION) ×3 IMPLANT
WIPE NON LINTING 3.25X3.25 (MISCELLANEOUS) ×3 IMPLANT

## 2014-11-26 NOTE — Discharge Instructions (Signed)
AMBULATORY SURGERY  °DISCHARGE INSTRUCTIONS ° ° °1) The drugs that you were given will stay in your system until tomorrow so for the next 24 hours you should not: ° °A) Drive an automobile °B) Make any legal decisions °C) Drink any alcoholic beverage ° ° °2) You may resume regular meals tomorrow.  Today it is better to start with liquids and gradually work up to solid foods. ° °You may eat anything you prefer, but it is better to start with liquids, then soup and crackers, and gradually work up to solid foods. ° ° °3) Please notify your doctor immediately if you have any unusual bleeding, trouble breathing, redness and pain at the surgery site, drainage, fever, or pain not relieved by medication. ° ° ° °4) Additional Instructions: ° ° ° °Cataract Surgery °Care After °Refer to this sheet in the next few weeks. These instructions provide you with information on caring for yourself after your procedure. Your caregiver may also give you more specific instructions. Your treatment has been planned according to current medical practices, but problems sometimes occur. Call your caregiver if you have any problems or questions after your procedure.  °HOME CARE INSTRUCTIONS  °· Avoid strenuous activities as directed by your caregiver. °· Ask your caregiver when you can resume driving. °· Use eyedrops or other medicines to help healing and control pressure inside your eye as directed by your caregiver. °· Only take over-the-counter or prescription medicines for pain, discomfort, or fever as directed by your caregiver. °· Do not to touch or rub your eyes. °· You may be instructed to use a protective shield during the first few days and nights after surgery. If not, wear sunglasses to protect your eyes. This is to protect the eye from pressure or from being accidentally bumped. °· Keep the area around your eye clean and dry. Avoid swimming or allowing water to hit you directly in the face while showering. Keep soap and shampoo  out of your eyes. °· Do not bend or lift heavy objects. Bending increases pressure in the eye. You can walk, climb stairs, and do light household chores. °· Do not put a contact lens into the eye that had surgery until your caregiver says it is okay to do so. °· Ask your doctor when you can return to work. This will depend on the kind of work that you do. If you work in a dusty environment, you may be advised to wear protective eyewear for a period of time. °· Ask your caregiver when it will be safe to engage in sexual activity. °· Continue with your regular eye exams as directed by your caregiver. °What to expect: °· It is normal to feel itching and mild discomfort for a few days after cataract surgery. Some fluid discharge is also common, and your eye may be sensitive to light and touch. °· After 1 to 2 days, even moderate discomfort should disappear. In most cases, healing will take about 6 weeks. °· If you received an intraocular lens (IOL), you may notice that colors are very bright or have a blue tinge. Also, if you have been in bright sunlight, everything may appear reddish for a few hours. If you see these color tinges, it is because your lens is clear and no longer cloudy. Within a few months after receiving an IOL, these extra colors should go away. When you have healed, you will probably need new glasses. °SEEK MEDICAL CARE IF:  °· You have increased bruising around your   eye. °· You have discomfort not helped by medicine. °SEEK IMMEDIATE MEDICAL CARE IF:  °· You have a  fever. °· You have a worsening or sudden vision loss. °· You have redness, swelling, or increasing pain in the eye. °· You have a thick discharge from the eye that had surgery. °MAKE SURE YOU: °· Understand these instructions. °· Will watch your condition. °· Will get help right away if you are not doing well or get worse. °Document Released: 11/14/2004 Document Revised: 07/20/2011 Document Reviewed: 12/19/2010 °[employer]® Patient  Information ©2015 [employer]. This information is not intended to replace advice given to you by your health care provider. Make sure you discuss any questions you have with your health care provider. ° ° ° ° °Please contact your physician with any problems or Same Day Surgery at [phone], Monday through Friday 6 am to 4 pm, or [facility] [place] Main number at [phone]. °

## 2014-11-26 NOTE — Anesthesia Postprocedure Evaluation (Signed)
  Anesthesia Post-op Note  Patient: Kristin Mitchell  Procedure(s) Performed: Procedure(s) with comments: CATARACT EXTRACTION PHACO AND INTRAOCULAR LENS PLACEMENT (IOC) (Left) - US:01:24 AP:26.6 CDE:40.16 PACk JAR:[id number] H  Anesthesia type:MAC  Patient location: PACU  Post pain: Pain level controlled  Post assessment: Post-op Vital signs reviewed, Patient's Cardiovascular Status Stable, Respiratory Function Stable, Patent Airway and No signs of Nausea or vomiting  Post vital signs: Reviewed and stable  Last Vitals:  Filed Vitals:   11/26/14 1133  BP:   Pulse: 66  Temp:   Resp: 16    Level of consciousness: awake, alert  and patient cooperative  Complications: No apparent anesthesia complications

## 2014-11-26 NOTE — Anesthesia Preprocedure Evaluation (Signed)
Anesthesia Evaluation  Patient identified by MRN, date of birth, ID band Patient awake    Reviewed: Allergy & Precautions, H&P , NPO status , Patient's Chart, lab work & pertinent test results, reviewed documented beta blocker date and time   History of Anesthesia Complications Negative for: history of anesthetic complications  Airway Mallampati: II  TM Distance: >3 FB Neck ROM: full    Dental no notable dental hx. (+) Teeth Intact   Pulmonary neg pulmonary ROS,  breath sounds clear to auscultation  Pulmonary exam normal       Cardiovascular Exercise Tolerance: Good Normal cardiovascular exam+ dysrhythmias Rhythm:regular Rate:Normal     Neuro/Psych negative neurological ROS  negative psych ROS   GI/Hepatic negative GI ROS, Neg liver ROS,   Endo/Other  negative endocrine ROS  Renal/GU negative Renal ROS  negative genitourinary   Musculoskeletal  (+) Arthritis -,   Abdominal   Peds  Hematology negative hematology ROS (+)   Anesthesia Other Findings Past Medical History:   Intermittent palpitations                                      Comment:in early am   Cancer                                                         Comment:breast   Arthritis                                                    Reproductive/Obstetrics negative OB ROS                             Anesthesia Physical Anesthesia Plan  ASA: III  Anesthesia Plan: MAC   Post-op Pain Management:    Induction:   Airway Management Planned:   Additional Equipment:   Intra-op Plan:   Post-operative Plan:   Informed Consent: I have reviewed the patients History and Physical, chart, labs and discussed the procedure including the risks, benefits and alternatives for the proposed anesthesia with the patient or authorized representative who has indicated his/her understanding and acceptance.   Dental Advisory Given  Plan  Discussed with: Anesthesiologist, CRNA and Surgeon  Anesthesia Plan Comments:         Anesthesia Quick Evaluation

## 2014-11-26 NOTE — H&P (Signed)
  History and physical was faxed and scanned in.   

## 2014-11-26 NOTE — Op Note (Signed)
Date of Surgery: 11/26/2014 Date of Dictation: 11/26/2014 11:24 AM Pre-operative Diagnosis:  Nuclear Sclerotic Cataract left Eye Post-operative Diagnosis: same Procedure performed: Extra-capsular Cataract Extraction (ECCE) with placement of a posterior chamber intraocular lens (IOL) left Eye IOL:  Implant Name Type Inv. Item Serial No. Manufacturer Lot No. LRB No. Used  Acrysof     [id number] ALCON   Left 1   Anesthesia: 2% Lidocaine and 4% Marcaine in a 50/50 mixture with 10 unites/ml of Hylenex given as a peribulbar Anesthesiologist: Anesthesiologist: Andria Frames, MD CRNA: Sinda Du, CRNA Complications: none Estimated Blood Loss: less than 1 ml  Description of procedure:  The patient was given anesthesia and sedation via intravenous access. The patient was then prepped and draped in the usual fashion. A 25-gauge needle was bent for initiating the capsulorhexis. A 5-0 silk suture was placed through the conjunctiva superior and inferiorly to serve as bridle sutures. Hemostasis was obtained at the superior limbus using an eraser cautery. A partial thickness groove was made at the anterior surgical limbus with a 64 Beaver blade and this was dissected anteriorly with an [employer]. The anterior chamber was entered at 10 o'clock with a 1.0 mm paracentesis knife and through the lamellar dissection with a 2.6 mm Alcon keratome. Epi-Shugarcaine 0.5 CC [9 cc BSS Plus (Alcon), 3 cc 4% preservative-free lidocaine (Hospira) and 4 cc 1:1000 preservative-free, bisulfite-free epinephrine] was injected via the paracentesis tract. DiscoVisc was injected to replace the aqueous and a continuous tear curvilinear capsulorhexis was performed using a bent 25-gauge needle.  Balance salt on a syringe was used to perform hydro-dissection and phacoemulsification was carried out using a divide and conquer technique. Procedure(s) with comments: CATARACT EXTRACTION PHACO AND INTRAOCULAR LENS PLACEMENT (IOC)  (Left) - US:01:24 AP:26.6 CDE:40.16 PACk [id number]:[id number] H. Irrigation/aspiration was used to remove the residual cortex and the capsular bag was inflated with DiscoVisc. The intraocular lens was inserted into the capsular bag using a pre-loaded UltraSert Delivery System. Irrigation/aspiration was used to remove the residual DiscoVisc. The wound was inflated with balanced salt and checked for leaks. None were found. Miostat was injected via the paracentesis track and 0.1 ml of Vigamox containing 1 mg of drug  was injected via the paracentesis track. The wound was checked for leaks again and none were found.   The bridal sutures were removed and two drops of Vigamox were placed on the eye. An eye shield was placed to protect the eye and the patient was discharged to the recovery area in good condition.   Anushka Hartinger MD

## 2014-11-26 NOTE — Transfer of Care (Signed)
Immediate Anesthesia Transfer of Care Note  Patient: Kristin Mitchell  Procedure(s) Performed: Procedure(s) with comments: CATARACT EXTRACTION PHACO AND INTRAOCULAR LENS PLACEMENT (IOC) (Left) - US:01:24 AP:26.6 CDE:40.16 PACk MMO:[id number] H  Patient Location: PACU  Anesthesia Type:MAC  Level of Consciousness: awake  Airway & Oxygen Therapy: Patient Spontanous Breathing  Post-op Assessment: Report given to RN  Post vital signs: Reviewed  Last Vitals:  Filed Vitals:   11/26/14 0939  BP: 149/79  Pulse: 67  Temp: 36.6 C  Resp: 14    Complications: No apparent anesthesia complications

## 2014-11-26 NOTE — Interval H&P Note (Signed)
History and Physical Interval Note:  11/26/2014 10:45 AM  Kristin Mitchell  has presented today for surgery, with the diagnosis of CATARACT  The various methods of treatment have been discussed with the patient and family. After consideration of risks, benefits and other options for treatment, the patient has consented to  Procedure(s): CATARACT EXTRACTION PHACO AND INTRAOCULAR LENS PLACEMENT ([place]) (Left) as a surgical intervention .  The patient's history has been reviewed, patient examined, no change in status, stable for surgery.  I have reviewed the patient's chart and labs.  Questions were answered to the patient's satisfaction.     Marcellus Pulliam

## 2015-05-10 DIAGNOSIS — I10 Essential (primary) hypertension: Secondary | ICD-10-CM | POA: Insufficient documentation

## 2015-05-24 ENCOUNTER — Inpatient Hospital Stay (HOSPITAL_BASED_OUTPATIENT_CLINIC_OR_DEPARTMENT_OTHER): Payer: Medicare Other | Admitting: Oncology

## 2015-05-24 ENCOUNTER — Other Ambulatory Visit: Payer: Self-pay | Admitting: Family Medicine

## 2015-05-24 ENCOUNTER — Inpatient Hospital Stay: Payer: Medicare Other | Attending: Oncology

## 2015-05-24 VITALS — BP 129/78 | HR 72 | Temp 98.1°F | Resp 16 | Wt 192.5 lb

## 2015-05-24 DIAGNOSIS — M25562 Pain in left knee: Secondary | ICD-10-CM

## 2015-05-24 DIAGNOSIS — Z79899 Other long term (current) drug therapy: Secondary | ICD-10-CM | POA: Diagnosis not present

## 2015-05-24 DIAGNOSIS — D696 Thrombocytopenia, unspecified: Secondary | ICD-10-CM

## 2015-05-24 DIAGNOSIS — Z9013 Acquired absence of bilateral breasts and nipples: Secondary | ICD-10-CM

## 2015-05-24 DIAGNOSIS — Z90710 Acquired absence of both cervix and uterus: Secondary | ICD-10-CM | POA: Insufficient documentation

## 2015-05-24 DIAGNOSIS — C50919 Malignant neoplasm of unspecified site of unspecified female breast: Secondary | ICD-10-CM | POA: Diagnosis present

## 2015-05-24 DIAGNOSIS — M1990 Unspecified osteoarthritis, unspecified site: Secondary | ICD-10-CM | POA: Diagnosis not present

## 2015-05-24 DIAGNOSIS — Z79811 Long term (current) use of aromatase inhibitors: Secondary | ICD-10-CM

## 2015-05-24 DIAGNOSIS — R42 Dizziness and giddiness: Secondary | ICD-10-CM | POA: Diagnosis not present

## 2015-05-24 DIAGNOSIS — M25561 Pain in right knee: Secondary | ICD-10-CM

## 2015-05-24 DIAGNOSIS — Z170 Estrogen receptor positive status [ER+]: Secondary | ICD-10-CM

## 2015-05-24 DIAGNOSIS — M8580 Other specified disorders of bone density and structure, unspecified site: Secondary | ICD-10-CM

## 2015-05-24 LAB — COMPREHENSIVE METABOLIC PANEL
ALK PHOS: 93 U/L (ref 38–126)
ALT: 12 U/L — AB (ref 14–54)
ANION GAP: 7 (ref 5–15)
AST: 21 U/L (ref 15–41)
Albumin: 3.9 g/dL (ref 3.5–5.0)
BILIRUBIN TOTAL: 0.7 mg/dL (ref 0.3–1.2)
BUN: 20 mg/dL (ref 6–20)
CHLORIDE: 102 mmol/L (ref 101–111)
CO2: 28 mmol/L (ref 22–32)
Calcium: 9.5 mg/dL (ref 8.9–10.3)
Creatinine, Ser: 0.94 mg/dL (ref 0.44–1.00)
GFR calc Af Amer: >60 mL/min (ref >60)
GFR calc non Af Amer: 58 mL/min — ABNORMAL LOW (ref >60)
Glucose, Bld: 96 mg/dL (ref 65–99)
POTASSIUM: 4.6 mmol/L (ref 3.5–5.1)
SODIUM: 137 mmol/L (ref 135–145)
TOTAL PROTEIN: 7.3 g/dL (ref 6.5–8.1)

## 2015-05-24 NOTE — Progress Notes (Signed)
[facility]  [phone]  [phone]     Kristin Mitchell DOB: 01/30/1941  MR#: [id number]  IRW#:[id number]  Patient Care Team: Sherrin Daisy, MD as PCP - General (Family Medicine)  CHIEF COMPLAINT:  Patient returns to clinic for continued follow-up regarding breast cancer, stage IIB, ER/PR positive, HER-2 negative.  INTERVAL HISTORY:   Patient returns to clinic today for routine six-month evaluation.  She continues to tolerate letrozole without significant side effects. She currently feels well and is asymptomatic. She has recently been having dizzy spells which is being evaluated with her pcp. She denies any weakness or fatigue.  She denies any fevers.   She denies any easy bleeding or bruising. She has no chest pain or cough.  She has fair appetite and denies weight loss.  She does complain of bilateral knee pain but it is tolerable.  She denies any nausea, vomiting, constipation, or diarrhea.  She has no urinary complaints.  She stated that her platelets were low (126) at her last visit with her primary care physician in December 2016. Patient offers no specific complaints today.  REVIEW OF SYSTEMS:   Review of Systems  Constitutional: Negative for fever, chills, weight loss, malaise/fatigue and diaphoresis.  HENT: Negative for congestion, ear discharge, ear pain, hearing loss, nosebleeds, sore throat and tinnitus.   Eyes: Negative for blurred vision, double vision, photophobia, pain, discharge and redness.  Respiratory: Negative for cough, hemoptysis, sputum production, shortness of breath, wheezing and stridor.   Cardiovascular: Negative for chest pain, palpitations, orthopnea, claudication, leg swelling and PND.  Gastrointestinal: Negative for heartburn, nausea, vomiting, abdominal pain, diarrhea, constipation, blood in stool and melena.  Genitourinary: Negative.   Musculoskeletal: Positive for joint pain.  Skin: Negative.   Neurological:  Positive for dizziness. Negative for tingling, focal weakness, seizures, weakness and headaches.  Endo/Heme/Allergies: Negative.  Does not bruise/bleed easily.  Psychiatric/Behavioral: Negative for depression. The patient is not nervous/anxious and does not have insomnia.     As per HPI. Otherwise, a complete review of systems is negatve.  ONCOLOGY HISTORY:  No history exists.    PAST MEDICAL HISTORY: Past Medical History  Diagnosis Date  . Intermittent palpitations     in early am  . Cancer     breast  . Arthritis   . Breast cancer 11/26/2014    PAST SURGICAL HISTORY: Past Surgical History  Procedure Laterality Date  . Abdominal hysterectomy    . Mastectomy      bilateral  . Cholecystectomy    . Eye surgery    . Cataract extraction w/phaco Left 11/26/2014    Procedure: CATARACT EXTRACTION PHACO AND INTRAOCULAR LENS PLACEMENT (IOC);  Surgeon: Estill Cotta, MD;  Location: [facility] ORS;  Service: Ophthalmology;  Laterality: Left;  US:01:24 AP:26.6 CDE:40.16 PACk PYP:[id number] H    FAMILY HISTORY: Reviewed and unchanged. No reported history of malignancy or chronic disease.  GYNECOLOGIC HISTORY:  No LMP recorded. Patient has had a hysterectomy.     ADVANCED DIRECTIVES:    HEALTH MAINTENANCE: Social History  Substance Use Topics  . Smoking status: Never Smoker   . Smokeless tobacco: Not on file  . Alcohol Use: No     Colonoscopy:  PAP:  Bone density:  Lipid panel:  Allergies  Allergen Reactions  . Penicillins Rash    Current Outpatient Prescriptions  Medication Sig Dispense Refill  . acetaminophen (TYLENOL) 500 MG tablet Take 500 mg by mouth every 6 (six) hours as needed for mild pain (leg pain).    Marland Kitchen  Calcium Carbonate-Vitamin D (CALCIUM-VITAMIN D) 500-200 MG-UNIT per tablet Take 1 tablet by mouth daily.    Marland Kitchen letrozole (FEMARA) 2.5 MG tablet Take 1 tablet (2.5 mg total) by mouth daily. 30 tablet 6   No current facility-administered medications for this  visit.    OBJECTIVE: BP 129/78 mmHg  Pulse 72  Temp(Src) 98.1 F (36.7 C) (Tympanic)  Resp 16  Wt 192 lb 7.4 oz (87.3 kg)   Body mass index is 34.1 kg/(m^2).    ECOG FS:0 - Asymptomatic  General: Well-developed, well-nourished, no acute distress. Eyes: Pink conjunctiva, anicteric sclera. Breast: Exam deferred. Patient has bilateral mastectomies. Lungs: Clear to auscultation bilaterally. Heart: Regular rate and rhythm. No rubs, murmurs, or gallops. Abdomen: Soft, nontender, nondistended. No organomegaly noted, normoactive bowel sounds. Breast: Bilateral mastectomy, patient deferred exam today.  Musculoskeletal: No edema, cyanosis, or clubbing. Neuro: Alert, answering all questions appropriately. Cranial nerves grossly intact. Skin: No rashes or petechiae noted. Psych: Normal affect.   LAB RESULTS:  Appointment on 05/24/2015  Component Date Value Ref Range Status  . Sodium 05/24/2015 137  135 - 145 mmol/L Final  . Potassium 05/24/2015 4.6  3.5 - 5.1 mmol/L Final  . Chloride 05/24/2015 102  101 - 111 mmol/L Final  . CO2 05/24/2015 28  22 - 32 mmol/L Final  . Glucose, Bld 05/24/2015 96  65 - 99 mg/dL Final  . BUN 05/24/2015 20  6 - 20 mg/dL Final  . Creatinine, Ser 05/24/2015 0.94  0.44 - 1.00 mg/dL Final  . Calcium 05/24/2015 9.5  8.9 - 10.3 mg/dL Final  . Total Protein 05/24/2015 7.3  6.5 - 8.1 g/dL Final  . Albumin 05/24/2015 3.9  3.5 - 5.0 g/dL Final  . AST 05/24/2015 21  15 - 41 U/L Final  . ALT 05/24/2015 12* 14 - 54 U/L Final  . Alkaline Phosphatase 05/24/2015 93  38 - 126 U/L Final  . Total Bilirubin 05/24/2015 0.7  0.3 - 1.2 mg/dL Final  . GFR calc non Af Amer 05/24/2015 58* >60 mL/min Final  . GFR calc Af Amer 05/24/2015 >60  >60 mL/min Final   Comment: (NOTE) The eGFR has been calculated using the CKD EPI equation. This calculation has not been validated in all clinical situations. eGFR's persistently <60 mL/min signify possible Chronic Kidney Disease.   .  Anion gap 05/24/2015 7  5 - 15 Final    STUDIES: No results found.  ASSESSMENT:  Breast cancer, stage IIB ER/PR positive  PLAN:  1. Breast cancer. Clinically there is no evidence of recurrent disease. Patient has had previous bilateral mastectomy and therefore does not require any further mammograms. She is currently taking letrozole, calcium, and vitamin D and tolerating well. She will complete 5 years of letrozole in April 2018. She agrees to go for a bone density in the next week or two. Last scheduled bone density was in September 2015 and revealed a T score of -2.4 which was unchanged. Follow-up in 6 months with Ca 27.29. 2. Dizziness: Being evaluated by pcp. 3. Thrombocytopenia: Mild. Being monitored by pcp.  4. Joint Pain: Monitor. Patient is going to PT.  Patient expressed understanding and was in agreement with this plan. She also understands that She can call clinic at any time with any questions, concerns, or complaints.    Mayra Reel, NP   05/24/2015 11:26 AM  Patient was seen and evaluated independently and I agree with the assessment and plan as dictated above.  Lloyd Huger, MD 05/24/2015  2:38 PM

## 2015-05-24 NOTE — Progress Notes (Signed)
Patient does not offer any concerns regarding breast cancer today but would like to discuss low platelet on recent labs at PCP.

## 2015-05-25 LAB — CANCER ANTIGEN 27.29: CA 27.29: 28.5 U/mL (ref 0.0–38.6)

## 2015-05-30 ENCOUNTER — Ambulatory Visit
Admission: RE | Admit: 2015-05-30 | Discharge: 2015-05-30 | Disposition: A | Discharge status: Home / Self Care | Payer: Medicare Other | Source: Ambulatory Visit | Attending: Oncology | Admitting: Oncology

## 2015-05-30 DIAGNOSIS — Z780 Asymptomatic menopausal state: Secondary | ICD-10-CM | POA: Diagnosis not present

## 2015-05-30 DIAGNOSIS — M8580 Other specified disorders of bone density and structure, unspecified site: Secondary | ICD-10-CM

## 2015-06-10 DIAGNOSIS — M1710 Unilateral primary osteoarthritis, unspecified knee: Secondary | ICD-10-CM | POA: Insufficient documentation

## 2015-06-10 DIAGNOSIS — M1711 Unilateral primary osteoarthritis, right knee: Secondary | ICD-10-CM | POA: Insufficient documentation

## 2015-06-10 DIAGNOSIS — M25569 Pain in unspecified knee: Secondary | ICD-10-CM | POA: Insufficient documentation

## 2015-06-10 DIAGNOSIS — M1712 Unilateral primary osteoarthritis, left knee: Principal | ICD-10-CM | POA: Insufficient documentation

## 2015-06-10 DIAGNOSIS — M179 Osteoarthritis of knee, unspecified: Secondary | ICD-10-CM | POA: Insufficient documentation

## 2015-06-17 ENCOUNTER — Other Ambulatory Visit: Payer: Self-pay | Admitting: Family Medicine

## 2015-07-28 ENCOUNTER — Other Ambulatory Visit: Payer: Self-pay | Admitting: Family Medicine

## 2015-08-24 ENCOUNTER — Other Ambulatory Visit: Payer: Self-pay | Admitting: Family Medicine

## 2015-09-28 ENCOUNTER — Other Ambulatory Visit: Payer: Self-pay | Admitting: Family Medicine

## 2015-10-26 ENCOUNTER — Other Ambulatory Visit: Payer: Self-pay | Admitting: Family Medicine

## 2015-11-18 ENCOUNTER — Other Ambulatory Visit: Payer: Self-pay | Admitting: Family Medicine

## 2015-11-21 ENCOUNTER — Other Ambulatory Visit: Payer: Self-pay | Admitting: *Deleted

## 2015-11-21 DIAGNOSIS — C50911 Malignant neoplasm of unspecified site of right female breast: Secondary | ICD-10-CM

## 2015-11-22 ENCOUNTER — Ambulatory Visit (HOSPITAL_BASED_OUTPATIENT_CLINIC_OR_DEPARTMENT_OTHER): Payer: Medicare Other | Admitting: Oncology

## 2015-11-22 ENCOUNTER — Inpatient Hospital Stay: Payer: Medicare Other | Attending: Oncology

## 2015-11-22 VITALS — BP 109/72 | HR 76 | Temp 97.6°F | Ht 63.0 in | Wt 199.7 lb

## 2015-11-22 DIAGNOSIS — Z79811 Long term (current) use of aromatase inhibitors: Secondary | ICD-10-CM | POA: Insufficient documentation

## 2015-11-22 DIAGNOSIS — D696 Thrombocytopenia, unspecified: Secondary | ICD-10-CM

## 2015-11-22 DIAGNOSIS — M1990 Unspecified osteoarthritis, unspecified site: Secondary | ICD-10-CM | POA: Insufficient documentation

## 2015-11-22 DIAGNOSIS — Z9013 Acquired absence of bilateral breasts and nipples: Secondary | ICD-10-CM | POA: Diagnosis not present

## 2015-11-22 DIAGNOSIS — C50411 Malignant neoplasm of upper-outer quadrant of right female breast: Secondary | ICD-10-CM

## 2015-11-22 DIAGNOSIS — C50911 Malignant neoplasm of unspecified site of right female breast: Secondary | ICD-10-CM

## 2015-11-22 DIAGNOSIS — Z79899 Other long term (current) drug therapy: Secondary | ICD-10-CM | POA: Insufficient documentation

## 2015-11-22 DIAGNOSIS — Z170 Estrogen receptor positive status [ER+]: Secondary | ICD-10-CM | POA: Insufficient documentation

## 2015-11-22 LAB — COMPREHENSIVE METABOLIC PANEL
ALT: 13 U/L — ABNORMAL LOW (ref 14–54)
ANION GAP: 7 (ref 5–15)
AST: 18 U/L (ref 15–41)
Albumin: 3.8 g/dL (ref 3.5–5.0)
Alkaline Phosphatase: 89 U/L (ref 38–126)
BUN: 19 mg/dL (ref 6–20)
CHLORIDE: 105 mmol/L (ref 101–111)
CO2: 24 mmol/L (ref 22–32)
Calcium: 9.2 mg/dL (ref 8.9–10.3)
Creatinine, Ser: 0.93 mg/dL (ref 0.44–1.00)
GFR, EST NON AFRICAN AMERICAN: 59 mL/min — AB (ref >60)
Glucose, Bld: 90 mg/dL (ref 65–99)
POTASSIUM: 4.2 mmol/L (ref 3.5–5.1)
Sodium: 136 mmol/L (ref 135–145)
TOTAL PROTEIN: 7.1 g/dL (ref 6.5–8.1)
Total Bilirubin: 0.6 mg/dL (ref 0.3–1.2)

## 2015-11-22 LAB — CBC WITH DIFFERENTIAL/PLATELET
BASOS ABS: 0.1 10*3/uL (ref 0–0.1)
Basophils Relative: 1 %
EOS PCT: 6 %
Eosinophils Absolute: 0.4 10*3/uL (ref 0–0.7)
HCT: 38.2 % (ref 35.0–47.0)
Hemoglobin: 12.7 g/dL (ref 12.0–16.0)
LYMPHS ABS: 1.1 10*3/uL (ref 1.0–3.6)
LYMPHS PCT: 17 %
MCH: 30.8 pg (ref 26.0–34.0)
MCHC: 33.3 g/dL (ref 32.0–36.0)
MCV: 92.5 fL (ref 80.0–100.0)
MONO ABS: 0.7 10*3/uL (ref 0.2–0.9)
MONOS PCT: 10 %
Neutro Abs: 4.3 10*3/uL (ref 1.4–6.5)
Neutrophils Relative %: 66 %
PLATELETS: 127 10*3/uL — AB (ref 150–440)
RBC: 4.13 MIL/uL (ref 3.80–5.20)
RDW: 13.1 % (ref 11.5–14.5)
WBC: 6.5 10*3/uL (ref 3.6–11.0)

## 2015-11-26 LAB — CANCER ANTIGEN 27.29: CA 27.29: 24.7 U/mL (ref 0.0–38.6)

## 2015-11-29 NOTE — Progress Notes (Signed)
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  INTERVAL HISTORY:   Patient returns to clinic today for routine six-month evaluation. She continues to tolerate letrozole without significant side effects. She currently feels well and is asymptomatic. She has no neurologic complaints.  She denies any weakness or fatigue. She denies any fevers or illnesses. She has no chest pain or shortness of breath. She does complain of bilateral knee pain but it is tolerable.  She denies any nausea, vomiting, constipation, or diarrhea.  She has no urinary complaints. Patient offers no specific complaints today.  REVIEW OF SYSTEMS:   Review of Systems  Constitutional: Negative.  Negative for fever, weight loss and malaise/fatigue.  Respiratory: Negative.  Negative for cough and shortness of breath.   Cardiovascular: Negative.  Negative for chest pain.  Gastrointestinal: Negative.  Negative for abdominal pain.  Genitourinary: Negative.   Musculoskeletal: Positive for joint pain.  Neurological: Negative.  Negative for weakness.  Psychiatric/Behavioral: Negative.  The patient is not nervous/anxious.     As per HPI. Otherwise, a complete review of systems is negatve.  ONCOLOGY HISTORY:  No history exists.    PAST MEDICAL HISTORY: Past Medical History  Diagnosis Date  . Intermittent palpitations     in early am  . Cancer     breast  . Arthritis   . Breast cancer 11/26/2014    PAST SURGICAL HISTORY: Past Surgical History  Procedure Laterality Date  . Abdominal hysterectomy    . Mastectomy      bilateral  . Cholecystectomy    . Eye surgery    . Cataract extraction w/phaco Left 11/26/2014    Procedure: CATARACT EXTRACTION  PHACO AND INTRAOCULAR LENS PLACEMENT (IOC);  Surgeon: Estill Cotta, MD;  Location: [facility] ORS;  Service: Ophthalmology;  Laterality: Left;  US:01:24 AP:26.6 CDE:40.16 PACk [id number]:[id number] H    FAMILY HISTORY: Reviewed and unchanged. No reported history of malignancy or chronic disease.  GYNECOLOGIC HISTORY:  No LMP recorded. Patient has had a hysterectomy.     ADVANCED DIRECTIVES:    HEALTH MAINTENANCE: Social History  Substance Use Topics  . Smoking status: Never Smoker   . Smokeless tobacco: Not on file  . Alcohol Use: No     Colonoscopy:  PAP:  Bone density:  Lipid panel:  Allergies  Allergen Reactions  . Penicillins Rash    Current Outpatient Prescriptions  Medication Sig Dispense Refill  . Calcium Carbonate-Vitamin D (CALCIUM-VITAMIN D) 500-200 MG-UNIT per tablet Take 1 tablet by mouth daily.    Marland Kitchen letrozole (FEMARA) 2.5 MG tablet TAKE ONE TABLET BY MOUTH ONCE DAILY 30 tablet 0  . lisinopril (PRINIVIL,ZESTRIL) 10 MG tablet      No current facility-administered medications for this visit.    OBJECTIVE: BP 109/72 mmHg  Pulse 76  Temp(Src) 97.6 F (36.4 C) (Tympanic)  Ht _0  (1.6 m)  Wt 199 lb 11.8 oz (90.6 kg)  BMI 35.39 kg/m2   Body mass index is 35.39 kg/(m^2).    ECOG FS:0 - Asymptomatic  General: Well-developed, well-nourished, no acute distress. Eyes: Pink conjunctiva, anicteric sclera. Breast: Patient has bilateral mastectomies, no evidence of chest wall recurrence. Lungs: Clear to auscultation bilaterally. Heart: Regular rate and rhythm. No rubs, murmurs, or gallops. Abdomen:  Soft, nontender, nondistended. No organomegaly noted, normoactive bowel sounds. Breast: Bilateral mastectomy, patient deferred exam today.  Musculoskeletal: No edema, cyanosis, or clubbing. Neuro: Alert, answering all questions appropriately. Cranial nerves grossly intact. Skin: No rashes or petechiae noted. Psych: Normal affect.   LAB RESULTS:  Appointment on 11/22/2015   Component Date Value Ref Range Status  . WBC 11/22/2015 6.5  3.6 - 11.0 K/uL Final  . RBC 11/22/2015 4.13  3.80 - 5.20 MIL/uL Final  . Hemoglobin 11/22/2015 12.7  12.0 - 16.0 g/dL Final  . HCT 11/22/2015 38.2  35.0 - 47.0 % Final  . MCV 11/22/2015 92.5  80.0 - 100.0 fL Final  . MCH 11/22/2015 30.8  26.0 - 34.0 pg Final  . MCHC 11/22/2015 33.3  32.0 - 36.0 g/dL Final  . RDW 11/22/2015 13.1  11.5 - 14.5 % Final  . Platelets 11/22/2015 127* 150 - 440 K/uL Final  . Neutrophils Relative % 11/22/2015 66   Final  . Neutro Abs 11/22/2015 4.3  1.4 - 6.5 K/uL Final  . Lymphocytes Relative 11/22/2015 17   Final  . Lymphs Abs 11/22/2015 1.1  1.0 - 3.6 K/uL Final  . Monocytes Relative 11/22/2015 10   Final  . Monocytes Absolute 11/22/2015 0.7  0.2 - 0.9 K/uL Final  . Eosinophils Relative 11/22/2015 6   Final  . Eosinophils Absolute 11/22/2015 0.4  0 - 0.7 K/uL Final  . Basophils Relative 11/22/2015 1   Final  . Basophils Absolute 11/22/2015 0.1  0 - 0.1 K/uL Final  . Sodium 11/22/2015 136  135 - 145 mmol/L Final  . Potassium 11/22/2015 4.2  3.5 - 5.1 mmol/L Final  . Chloride 11/22/2015 105  101 - 111 mmol/L Final  . CO2 11/22/2015 24  22 - 32 mmol/L Final  . Glucose, Bld 11/22/2015 90  65 - 99 mg/dL Final  . BUN 11/22/2015 19  6 - 20 mg/dL Final  . Creatinine, Ser 11/22/2015 0.93  0.44 - 1.00 mg/dL Final  . Calcium 11/22/2015 9.2  8.9 - 10.3 mg/dL Final  . Total Protein 11/22/2015 7.1  6.5 - 8.1 g/dL Final  . Albumin 11/22/2015 3.8  3.5 - 5.0 g/dL Final  . AST 11/22/2015 18  15 - 41 U/L Final  . ALT 11/22/2015 13* 14 - 54 U/L Final  . Alkaline Phosphatase 11/22/2015 89  38 - 126 U/L Final  . Total Bilirubin 11/22/2015 0.6  0.3 - 1.2 mg/dL Final  . GFR calc non Af Amer 11/22/2015 59* >60 mL/min Final  . GFR calc Af Amer 11/22/2015 >60  >60 mL/min Final   Comment: (NOTE) The eGFR has been calculated using the CKD EPI equation. This calculation has not been validated in all clinical  situations. eGFR's persistently <60 mL/min signify possible Chronic Kidney Disease.   . Anion gap 11/22/2015 7  5 - 15 Final  . CA 27.29 11/22/2015 24.7  0.0 - 38.6 U/mL Final   Comment: (NOTE) [employer]/ACS methodology Performed At: [facility] [place], [place] [id number] Lindon Romp MD XI:[id number]     STUDIES: No results found.  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  PLAN:  1. 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 Patient has had previous bilateral mastectomy and therefore does not require any further mammograms. She is currently taking letrozole, calcium, and vitamin D and tolerating it well. She will complete  5 years of letrozole in April 2018. She agrees to go for a bone density in the next week or two. Last scheduled bone density was in September 2015 and revealed a T score of -2.4 which was unchanged. Follow-up in 6 months with Ca 27.29. 2. Thrombocytopenia: Mild. Being monitored by pcp.   Patient expressed understanding and was in agreement with this plan. She also understands that She can call clinic at any time with any questions, concerns, or complaints.    Lloyd Huger, MD   11/29/2015 6:03 PM

## 2015-12-27 ENCOUNTER — Other Ambulatory Visit: Payer: Self-pay | Admitting: *Deleted

## 2015-12-27 MED ORDER — LETROZOLE 2.5 MG PO TABS: 2.5000 mg | ORAL_TABLET | Freq: Every day | ORAL | 2 refills | Status: DC

## 2016-03-28 ENCOUNTER — Other Ambulatory Visit: Payer: Self-pay | Admitting: Oncology

## 2016-05-20 NOTE — Progress Notes (Signed)
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  INTERVAL HISTORY:   Patient returns to clinic today for routine six-month evaluation. She continues to tolerate letrozole without significant side effects. She currently feels well and is asymptomatic. She has no neurologic complaints.  She does report having occasional "spells" of fatigue, weakness, and shortness of breath approximately 2 times per weak, lasting 10 minutes at a time, self-resolving. She sits down when she feels a spell approaching. She denies any fevers or illnesses. She has no chest pain. She does complain of bilateral knee pain, worse at night and first thing in the morning, relieved by Aleve every night, and movement in the morning.  She reports occasional constipation, relieved by Dulcolax when needed. She reports eating and sleeping well.  She denies any nausea, vomiting, or diarrhea.  She has no urinary complaints. Patient offers no further specific complaints today.  REVIEW OF SYSTEMS:   Review of Systems  Constitutional: Positive for malaise/fatigue. Negative for fever and weight loss.  Respiratory: Positive for shortness of breath. Negative for cough.   Cardiovascular: Negative.  Negative for chest pain.  Gastrointestinal: Positive for constipation. Negative for abdominal pain, blood in stool, diarrhea, nausea and vomiting.  Genitourinary: Negative.  Negative for hematuria.  Musculoskeletal: Positive for joint pain.  Neurological: Positive for weakness. Negative for dizziness and headaches.  Psychiatric/Behavioral: Negative.  The patient is not nervous/anxious and does not have insomnia.     As per HPI. Otherwise, a complete review of  systems is negative.  ONCOLOGY HISTORY:  No history exists.    PAST MEDICAL HISTORY: Past Medical History:  Diagnosis Date  . Arthritis   . Breast cancer 11/26/2014  . Cancer    breast  . Intermittent palpitations    in early am    PAST SURGICAL HISTORY: Past Surgical History:  Procedure Laterality Date  . ABDOMINAL HYSTERECTOMY    . CATARACT EXTRACTION W/PHACO Left 11/26/2014   Procedure: CATARACT EXTRACTION PHACO AND INTRAOCULAR LENS PLACEMENT (IOC);  Surgeon: Estill Cotta, MD;  Location: [facility] ORS;  Service: Ophthalmology;  Laterality: Left;  US:01:24 AP:26.6 CDE:40.16 PACk [id number] H  . CHOLECYSTECTOMY    . EYE SURGERY    . MASTECTOMY     bilateral    FAMILY HISTORY: Reviewed and unchanged. No reported history of malignancy or chronic disease.  GYNECOLOGIC HISTORY:  No LMP recorded. Patient has had a hysterectomy.     ADVANCED DIRECTIVES:    HEALTH MAINTENANCE: Social History  Substance Use Topics  . Smoking status: Never Smoker  . Smokeless tobacco: Not on file  . Alcohol use No     Colonoscopy:  PAP:  Bone density:  Lipid panel:  Allergies  Allergen Reactions  . Penicillins Rash    Current Outpatient Prescriptions  Medication Sig Dispense Refill  . Calcium Carbonate-Vitamin D (CALCIUM-VITAMIN D) 500-200 MG-UNIT per tablet Take 1 tablet by mouth daily.    Marland Kitchen letrozole (FEMARA) 2.5 MG tablet TAKE ONE TABLET BY MOUTH ONCE DAILY 30 tablet 2  . lisinopril (PRINIVIL,ZESTRIL) 10 MG tablet Take 10 mg by mouth daily.     . naproxen sodium (ANAPROX) 220 MG tablet Take 220 mg by mouth at bedtime.    Marland Kitchen alendronate (FOSAMAX) 70 MG  tablet Take 1 tablet (70 mg total) by mouth once a week. Take with a full glass of water on an empty stomach. 13 tablet 3   No current facility-administered medications for this visit.     OBJECTIVE: BP (!) 141/78 (BP Location: Left Arm, Patient Position: Sitting)   Pulse 87   Temp (!) 96 F (35.6 C) (Tympanic)   Resp  18   Wt 191 lb 9.3 oz (86.9 kg)   BMI 33.94 kg/m    Body mass index is 33.94 kg/m.    ECOG FS:0 - Asymptomatic  General: Well-developed, well-nourished, no acute distress. Eyes: Pink conjunctiva, anicteric sclera. Breast: Patient had bilateral mastectomies, no evidence of chest wall recurrence. Lungs: Clear to auscultation bilaterally. Heart: Regular rate and rhythm. No rubs, murmurs, or gallops. Abdomen: Soft, nontender, nondistended. No organomegaly noted, normoactive bowel sounds. Breast: Bilateral mastectomy, patient deferred exam today.  Musculoskeletal: No edema, cyanosis, or clubbing. Neuro: Alert, answering all questions appropriately. Cranial nerves grossly intact. Skin: No rashes or petechiae noted. Psych: Normal affect.   LAB RESULTS:  Appointment on 05/22/2016  Component Date Value Ref Range Status  . WBC 05/22/2016 4.8  3.6 - 11.0 K/uL Final  . RBC 05/22/2016 4.09  3.80 - 5.20 MIL/uL Final  . Hemoglobin 05/22/2016 12.5  12.0 - 16.0 g/dL Final  . HCT 05/22/2016 38.0  35.0 - 47.0 % Final  . MCV 05/22/2016 92.9  80.0 - 100.0 fL Final  . MCH 05/22/2016 30.7  26.0 - 34.0 pg Final  . MCHC 05/22/2016 33.0  32.0 - 36.0 g/dL Final  . RDW 05/22/2016 13.3  11.5 - 14.5 % Final  . Platelets 05/22/2016 164  150 - 440 K/uL Final  . Neutrophils Relative % 05/22/2016 59  % Final  . Neutro Abs 05/22/2016 2.9  1.4 - 6.5 K/uL Final  . Lymphocytes Relative 05/22/2016 22  % Final  . Lymphs Abs 05/22/2016 1.1  1.0 - 3.6 K/uL Final  . Monocytes Relative 05/22/2016 7  % Final  . Monocytes Absolute 05/22/2016 0.3  0.2 - 0.9 K/uL Final  . Eosinophils Relative 05/22/2016 8  % Final  . Eosinophils Absolute 05/22/2016 0.4  0 - 0.7 K/uL Final  . Basophils Relative 05/22/2016 4  % Final  . Basophils Absolute 05/22/2016 0.2* 0 - 0.1 K/uL Final  . Sodium 05/22/2016 137  135 - 145 mmol/L Final  . Potassium 05/22/2016 4.3  3.5 - 5.1 mmol/L Final  . Chloride 05/22/2016 106  101 - 111 mmol/L Final   . CO2 05/22/2016 24  22 - 32 mmol/L Final  . Glucose, Bld 05/22/2016 116* 65 - 99 mg/dL Final  . BUN 05/22/2016 20  6 - 20 mg/dL Final  . Creatinine, Ser 05/22/2016 1.04* 0.44 - 1.00 mg/dL Final  . Calcium 05/22/2016 9.2  8.9 - 10.3 mg/dL Final  . Total Protein 05/22/2016 6.9  6.5 - 8.1 g/dL Final  . Albumin 05/22/2016 3.8  3.5 - 5.0 g/dL Final  . AST 05/22/2016 21  15 - 41 U/L Final  . ALT 05/22/2016 12* 14 - 54 U/L Final  . Alkaline Phosphatase 05/22/2016 79  38 - 126 U/L Final  . Total Bilirubin 05/22/2016 0.8  0.3 - 1.2 mg/dL Final  . GFR calc non Af Amer 05/22/2016 51* >60 mL/min Final  . GFR calc Af Amer 05/22/2016 59* >60 mL/min Final   Comment: (NOTE) The eGFR has been calculated using the CKD EPI equation. This calculation has not been validated in all  clinical situations. eGFR's persistently <60 mL/min signify possible Chronic Kidney Disease.   . Anion gap 05/22/2016 7  5 - 15 Final    STUDIES: No results found.  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  PLAN:   1. 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 Patient has had previous bilateral mastectomy and therefore does not require any further mammograms. She is currently taking letrozole, calcium, and vitamin D and tolerating it well. She will complete 5 years of letrozole in April 2018. Ca 27.29 from July 2017 was stable and within normal limits at 24.7. Follow-up with labs in 6 months. 2. Osteoporosis: Her most recent bone density scan in January 2017 revealed a slightly increased T score of -2.5. Patient will start on weekly Fosamax. Continue taking calcium and vitamin D supplements. 3. Thrombocytopenia: Currently resolved. Being monitored by PCP.  4. Spells of fatigue/weakness/shortness of breath: Follow-up with PCP. Sit/stand slowly. Limit activity as needed.  Patient  expressed understanding and was in agreement with this plan. She also understands that She can call clinic at any time with any questions, concerns, or complaints.   Lucendia Herrlich, NP  05/22/16 11:39 AM   Patient was seen and evaluated independently and I agree with the assessment and plan as dictated above. Return to clinic in 6 months at which time patient can likely be transitioned to yearly visits.  Lloyd Huger, MD 05/24/16 11:02 AM

## 2016-05-22 ENCOUNTER — Inpatient Hospital Stay: Payer: Medicare Other | Attending: Oncology

## 2016-05-22 ENCOUNTER — Inpatient Hospital Stay (HOSPITAL_BASED_OUTPATIENT_CLINIC_OR_DEPARTMENT_OTHER): Payer: Medicare Other | Admitting: Oncology

## 2016-05-22 VITALS — BP 141/78 | HR 87 | Temp 96.0°F | Resp 18 | Wt 191.6 lb

## 2016-05-22 DIAGNOSIS — K5900 Constipation, unspecified: Secondary | ICD-10-CM

## 2016-05-22 DIAGNOSIS — Z9013 Acquired absence of bilateral breasts and nipples: Secondary | ICD-10-CM | POA: Insufficient documentation

## 2016-05-22 DIAGNOSIS — M810 Age-related osteoporosis without current pathological fracture: Secondary | ICD-10-CM | POA: Diagnosis not present

## 2016-05-22 DIAGNOSIS — Z880 Allergy status to penicillin: Secondary | ICD-10-CM | POA: Insufficient documentation

## 2016-05-22 DIAGNOSIS — Z170 Estrogen receptor positive status [ER+]: Secondary | ICD-10-CM | POA: Insufficient documentation

## 2016-05-22 DIAGNOSIS — R5381 Other malaise: Secondary | ICD-10-CM | POA: Insufficient documentation

## 2016-05-22 DIAGNOSIS — R5383 Other fatigue: Secondary | ICD-10-CM | POA: Diagnosis not present

## 2016-05-22 DIAGNOSIS — C50411 Malignant neoplasm of upper-outer quadrant of right female breast: Secondary | ICD-10-CM | POA: Diagnosis not present

## 2016-05-22 DIAGNOSIS — Z79899 Other long term (current) drug therapy: Secondary | ICD-10-CM | POA: Insufficient documentation

## 2016-05-22 DIAGNOSIS — R531 Weakness: Secondary | ICD-10-CM | POA: Insufficient documentation

## 2016-05-22 DIAGNOSIS — Z90710 Acquired absence of both cervix and uterus: Secondary | ICD-10-CM | POA: Insufficient documentation

## 2016-05-22 DIAGNOSIS — M25562 Pain in left knee: Secondary | ICD-10-CM | POA: Insufficient documentation

## 2016-05-22 DIAGNOSIS — M25561 Pain in right knee: Secondary | ICD-10-CM | POA: Insufficient documentation

## 2016-05-22 DIAGNOSIS — Z79811 Long term (current) use of aromatase inhibitors: Secondary | ICD-10-CM

## 2016-05-22 DIAGNOSIS — R0602 Shortness of breath: Secondary | ICD-10-CM

## 2016-05-22 DIAGNOSIS — C50911 Malignant neoplasm of unspecified site of right female breast: Secondary | ICD-10-CM

## 2016-05-22 LAB — CBC WITH DIFFERENTIAL/PLATELET
Basophils Absolute: 0.2 10*3/uL — ABNORMAL HIGH (ref 0–0.1)
Basophils Relative: 4 %
EOS ABS: 0.4 10*3/uL (ref 0–0.7)
Eosinophils Relative: 8 %
HCT: 38 % (ref 35.0–47.0)
HEMOGLOBIN: 12.5 g/dL (ref 12.0–16.0)
LYMPHS ABS: 1.1 10*3/uL (ref 1.0–3.6)
LYMPHS PCT: 22 %
MCH: 30.7 pg (ref 26.0–34.0)
MCHC: 33 g/dL (ref 32.0–36.0)
MCV: 92.9 fL (ref 80.0–100.0)
MONOS PCT: 7 %
Monocytes Absolute: 0.3 10*3/uL (ref 0.2–0.9)
Neutro Abs: 2.9 10*3/uL (ref 1.4–6.5)
Neutrophils Relative %: 59 %
Platelets: 164 10*3/uL (ref 150–440)
RBC: 4.09 MIL/uL (ref 3.80–5.20)
RDW: 13.3 % (ref 11.5–14.5)
WBC: 4.8 10*3/uL (ref 3.6–11.0)

## 2016-05-22 LAB — COMPREHENSIVE METABOLIC PANEL
ALT: 12 U/L — AB (ref 14–54)
ANION GAP: 7 (ref 5–15)
AST: 21 U/L (ref 15–41)
Albumin: 3.8 g/dL (ref 3.5–5.0)
Alkaline Phosphatase: 79 U/L (ref 38–126)
BILIRUBIN TOTAL: 0.8 mg/dL (ref 0.3–1.2)
BUN: 20 mg/dL (ref 6–20)
CALCIUM: 9.2 mg/dL (ref 8.9–10.3)
CO2: 24 mmol/L (ref 22–32)
CREATININE: 1.04 mg/dL — AB (ref 0.44–1.00)
Chloride: 106 mmol/L (ref 101–111)
GFR, EST AFRICAN AMERICAN: 59 mL/min — AB (ref >60)
GFR, EST NON AFRICAN AMERICAN: 51 mL/min — AB (ref >60)
Glucose, Bld: 116 mg/dL — ABNORMAL HIGH (ref 65–99)
Potassium: 4.3 mmol/L (ref 3.5–5.1)
Sodium: 137 mmol/L (ref 135–145)
TOTAL PROTEIN: 6.9 g/dL (ref 6.5–8.1)

## 2016-05-22 MED ORDER — ALENDRONATE SODIUM 70 MG PO TABS: 70.0000 mg | ORAL_TABLET | ORAL | 3 refills | Status: DC

## 2016-05-22 NOTE — Progress Notes (Signed)
Offers no complaints  

## 2016-05-23 LAB — CANCER ANTIGEN 27.29: CA 27.29: 27.4 U/mL (ref 0.0–38.6)

## 2016-05-29 ENCOUNTER — Other Ambulatory Visit: Payer: Medicare Other

## 2016-05-29 ENCOUNTER — Ambulatory Visit: Payer: Medicare Other | Admitting: Oncology

## 2016-06-11 ENCOUNTER — Encounter: Payer: Self-pay | Admitting: *Deleted

## 2016-06-11 NOTE — Progress Notes (Signed)
Patient came into clinic carrying her AVS. She pointed to her current medication list; She states she is not taking naproxen, she asked what it is for. I told her I would remove it from her medication list.

## 2016-06-27 ENCOUNTER — Other Ambulatory Visit: Payer: Self-pay | Admitting: Oncology

## 2016-09-26 ENCOUNTER — Other Ambulatory Visit: Payer: Self-pay | Admitting: Oncology

## 2016-11-17 NOTE — Progress Notes (Signed)
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  INTERVAL HISTORY:   Patient returns to clinic today for routine six-month evaluation. She continues to tolerate letrozole without significant side effects. She currently feels well and is asymptomatic. She has no neurologic complaints. She denies any fevers or illnesses. She denies any chest pain or shortness of breath. She has no nausea, vomiting, constipation, or diarrhea.  She has no urinary complaints. Patient offers no specific complaints today.  REVIEW OF SYSTEMS:   Review of Systems  Constitutional: Negative for fever, malaise/fatigue and weight loss.  Respiratory: Negative for cough and shortness of breath.   Cardiovascular: Negative.  Negative for chest pain.  Gastrointestinal: Negative for abdominal pain, blood in stool, constipation, diarrhea, nausea and vomiting.  Genitourinary: Negative.  Negative for hematuria.  Musculoskeletal: Positive for joint pain.  Neurological: Negative.  Negative for dizziness, sensory change, weakness and headaches.  Psychiatric/Behavioral: Negative.  The patient is not nervous/anxious and does not have insomnia.     As per HPI. Otherwise, a complete review of systems is negative.  ONCOLOGY HISTORY:  No history exists.    PAST MEDICAL HISTORY: Past Medical History:  Diagnosis Date  . Arthritis   . Breast cancer ([place]) 11/26/2014  . Cancer ([facility])    breast  . Intermittent palpitations    in early am    PAST SURGICAL HISTORY: Past Surgical History:  Procedure Laterality Date  . ABDOMINAL HYSTERECTOMY    . CATARACT EXTRACTION W/PHACO Left 11/26/2014   Procedure: CATARACT EXTRACTION PHACO AND INTRAOCULAR LENS  PLACEMENT (IOC);  Surgeon: Estill Cotta, MD;  Location: [facility] ORS;  Service: Ophthalmology;  Laterality: Left;  US:01:24 AP:26.6 CDE:40.16 PACk [id number] H  . CHOLECYSTECTOMY    . EYE SURGERY    . MASTECTOMY     bilateral    FAMILY HISTORY: Reviewed and unchanged. No reported history of malignancy or chronic disease.  GYNECOLOGIC HISTORY:  No LMP recorded. Patient has had a hysterectomy.     ADVANCED DIRECTIVES:    HEALTH MAINTENANCE: Social History  Substance Use Topics  . Smoking status: Never Smoker  . Smokeless tobacco: Never Used  . Alcohol use No     Colonoscopy:  PAP:  Bone density:  Lipid panel:  Allergies  Allergen Reactions  . Penicillins Rash    Current Outpatient Prescriptions  Medication Sig Dispense Refill  . alendronate (FOSAMAX) 70 MG tablet Take 1 tablet (70 mg total) by mouth once a week. Take with a full glass of water on an empty stomach. 13 tablet 3  . Calcium Carbonate-Vitamin D (CALCIUM-VITAMIN D) 500-200 MG-UNIT per tablet Take 1 tablet by mouth daily.    Marland Kitchen letrozole (FEMARA) 2.5 MG tablet TAKE 1 TABLET BY MOUTH ONCE DAILY 30 tablet 2  . lisinopril (PRINIVIL,ZESTRIL) 10 MG tablet Take 10 mg by mouth daily.     . naproxen sodium (ANAPROX) 220 MG tablet Take by mouth.     No current facility-administered medications for this visit.     OBJECTIVE: BP 124/73   Pulse 81   Temp (!) 97 F (36.1 C) (Tympanic)   Resp 16   Ht '5\' 3"'  (1.6 m)   Wt 191 lb (86.6 kg)   BMI 33.83 kg/m  Body mass index is 33.83 kg/m.    ECOG FS:0 - Asymptomatic  General: Well-developed, well-nourished, no acute distress. Eyes: Pink conjunctiva, anicteric sclera. Breast: Patient had bilateral mastectomies, no evidence of chest wall recurrence. Lungs: Clear to auscultation bilaterally. Heart: Regular rate and rhythm. No rubs, murmurs, or gallops. Abdomen: Soft, nontender, nondistended. No organomegaly noted, normoactive bowel sounds. Breast: Bilateral  mastectomy, patient deferred exam today.  Musculoskeletal: No edema, cyanosis, or clubbing. Neuro: Alert, answering all questions appropriately. Cranial nerves grossly intact. Skin: No rashes or petechiae noted. Psych: Normal affect.   LAB RESULTS:  Appointment on 11/20/2016  Component Date Value Ref Range Status  . WBC 11/20/2016 7.1  3.6 - 11.0 K/uL Final  . RBC 11/20/2016 4.11  3.80 - 5.20 MIL/uL Final  . Hemoglobin 11/20/2016 12.6  12.0 - 16.0 g/dL Final  . HCT 11/20/2016 37.2  35.0 - 47.0 % Final  . MCV 11/20/2016 90.5  80.0 - 100.0 fL Final  . MCH 11/20/2016 30.6  26.0 - 34.0 pg Final  . MCHC 11/20/2016 33.8  32.0 - 36.0 g/dL Final  . RDW 11/20/2016 14.1  11.5 - 14.5 % Final  . Platelets 11/20/2016 162  150 - 440 K/uL Final  . Neutrophils Relative % 11/20/2016 76  % Final  . Neutro Abs 11/20/2016 5.4  1.4 - 6.5 K/uL Final  . Lymphocytes Relative 11/20/2016 12  % Final  . Lymphs Abs 11/20/2016 0.9* 1.0 - 3.6 K/uL Final  . Monocytes Relative 11/20/2016 7  % Final  . Monocytes Absolute 11/20/2016 0.5  0.2 - 0.9 K/uL Final  . Eosinophils Relative 11/20/2016 4  % Final  . Eosinophils Absolute 11/20/2016 0.3  0 - 0.7 K/uL Final  . Basophils Relative 11/20/2016 1  % Final  . Basophils Absolute 11/20/2016 0.1  0 - 0.1 K/uL Final  . Sodium 11/20/2016 134* 135 - 145 mmol/L Final  . Potassium 11/20/2016 3.9  3.5 - 5.1 mmol/L Final  . Chloride 11/20/2016 101  101 - 111 mmol/L Final  . CO2 11/20/2016 26  22 - 32 mmol/L Final  . Glucose, Bld 11/20/2016 102* 65 - 99 mg/dL Final  . BUN 11/20/2016 22* 6 - 20 mg/dL Final  . Creatinine, Ser 11/20/2016 1.04* 0.44 - 1.00 mg/dL Final  . Calcium 11/20/2016 9.8  8.9 - 10.3 mg/dL Final  . Total Protein 11/20/2016 7.0  6.5 - 8.1 g/dL Final  . Albumin 11/20/2016 4.0  3.5 - 5.0 g/dL Final  . AST 11/20/2016 23  15 - 41 U/L Final  . ALT 11/20/2016 16  14 - 54 U/L Final  . Alkaline Phosphatase 11/20/2016 90  38 - 126 U/L Final  . Total Bilirubin  11/20/2016 0.6  0.3 - 1.2 mg/dL Final  . GFR calc non Af Amer 11/20/2016 51* >60 mL/min Final  . GFR calc Af Amer 11/20/2016 59* >60 mL/min Final   Comment: (NOTE) The eGFR has been calculated using the CKD EPI equation. This calculation has not been validated in all clinical situations. eGFR's persistently <60 mL/min signify possible Chronic Kidney Disease.   . Anion gap 11/20/2016 7  5 - 15 Final    STUDIES: No results found.  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  PLAN:   1. 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 Patient has had previous bilateral mastectomy and therefore does not require any further mammograms. She  has now completed over 5 years of letrozole and has been instructed to discontinue treatment. Return to clinic in 1 year for further evaluation.  2. Osteoporosis: Her most recent bone density scan in January 2017 revealed a slightly increased T score of -2.5. Continue Fosamax, calcium, and vitamin D. Will get repeat bone density in the next 1-2 weeks. Now that she has discontinued letrozole, management of her osteoporosis can be transitioned back to her primary care physician.    Patient expressed understanding and was in agreement with this plan. She also understands that She can call clinic at any time with any questions, concerns, or complaints.    Lloyd Huger, MD 11/20/16 11:10 AM

## 2016-11-20 ENCOUNTER — Inpatient Hospital Stay (HOSPITAL_BASED_OUTPATIENT_CLINIC_OR_DEPARTMENT_OTHER): Payer: Medicare Other | Admitting: Oncology

## 2016-11-20 ENCOUNTER — Encounter: Payer: Self-pay | Admitting: Oncology

## 2016-11-20 ENCOUNTER — Inpatient Hospital Stay: Payer: Medicare Other | Attending: Oncology

## 2016-11-20 VITALS — BP 124/73 | HR 81 | Temp 97.0°F | Resp 16 | Ht 63.0 in | Wt 191.0 lb

## 2016-11-20 DIAGNOSIS — Z79811 Long term (current) use of aromatase inhibitors: Secondary | ICD-10-CM

## 2016-11-20 DIAGNOSIS — Z9013 Acquired absence of bilateral breasts and nipples: Secondary | ICD-10-CM | POA: Diagnosis not present

## 2016-11-20 DIAGNOSIS — Z79899 Other long term (current) drug therapy: Secondary | ICD-10-CM | POA: Diagnosis not present

## 2016-11-20 DIAGNOSIS — Z170 Estrogen receptor positive status [ER+]: Secondary | ICD-10-CM | POA: Diagnosis not present

## 2016-11-20 DIAGNOSIS — M810 Age-related osteoporosis without current pathological fracture: Secondary | ICD-10-CM | POA: Diagnosis not present

## 2016-11-20 DIAGNOSIS — Z880 Allergy status to penicillin: Secondary | ICD-10-CM | POA: Diagnosis not present

## 2016-11-20 DIAGNOSIS — C50411 Malignant neoplasm of upper-outer quadrant of right female breast: Secondary | ICD-10-CM

## 2016-11-20 DIAGNOSIS — M1990 Unspecified osteoarthritis, unspecified site: Secondary | ICD-10-CM | POA: Insufficient documentation

## 2016-11-20 LAB — CBC WITH DIFFERENTIAL/PLATELET
BASOS ABS: 0.1 10*3/uL (ref 0–0.1)
BASOS PCT: 1 %
EOS ABS: 0.3 10*3/uL (ref 0–0.7)
Eosinophils Relative: 4 %
HEMATOCRIT: 37.2 % (ref 35.0–47.0)
Hemoglobin: 12.6 g/dL (ref 12.0–16.0)
Lymphocytes Relative: 12 %
Lymphs Abs: 0.9 10*3/uL — ABNORMAL LOW (ref 1.0–3.6)
MCH: 30.6 pg (ref 26.0–34.0)
MCHC: 33.8 g/dL (ref 32.0–36.0)
MCV: 90.5 fL (ref 80.0–100.0)
MONOS PCT: 7 %
Monocytes Absolute: 0.5 10*3/uL (ref 0.2–0.9)
NEUTROS ABS: 5.4 10*3/uL (ref 1.4–6.5)
NEUTROS PCT: 76 %
Platelets: 162 10*3/uL (ref 150–440)
RBC: 4.11 MIL/uL (ref 3.80–5.20)
RDW: 14.1 % (ref 11.5–14.5)
WBC: 7.1 10*3/uL (ref 3.6–11.0)

## 2016-11-20 LAB — COMPREHENSIVE METABOLIC PANEL
ALBUMIN: 4 g/dL (ref 3.5–5.0)
ALT: 16 U/L (ref 14–54)
AST: 23 U/L (ref 15–41)
Alkaline Phosphatase: 90 U/L (ref 38–126)
Anion gap: 7 (ref 5–15)
BILIRUBIN TOTAL: 0.6 mg/dL (ref 0.3–1.2)
BUN: 22 mg/dL — AB (ref 6–20)
CHLORIDE: 101 mmol/L (ref 101–111)
CO2: 26 mmol/L (ref 22–32)
Calcium: 9.8 mg/dL (ref 8.9–10.3)
Creatinine, Ser: 1.04 mg/dL — ABNORMAL HIGH (ref 0.44–1.00)
GFR calc Af Amer: 59 mL/min — ABNORMAL LOW (ref >60)
GFR calc non Af Amer: 51 mL/min — ABNORMAL LOW (ref >60)
GLUCOSE: 102 mg/dL — AB (ref 65–99)
POTASSIUM: 3.9 mmol/L (ref 3.5–5.1)
SODIUM: 134 mmol/L — AB (ref 135–145)
Total Protein: 7 g/dL (ref 6.5–8.1)

## 2016-11-20 NOTE — Progress Notes (Signed)
Patient  Here for breast ca follow up. No changes since last appointment.

## 2016-12-02 LAB — CA 27.29 (SERIAL MONITOR): CA 27.29: 25.6 U/mL (ref 0.0–38.6)

## 2017-01-06 DIAGNOSIS — I491 Atrial premature depolarization: Secondary | ICD-10-CM | POA: Insufficient documentation

## 2017-01-06 DIAGNOSIS — I493 Ventricular premature depolarization: Secondary | ICD-10-CM | POA: Insufficient documentation

## 2017-03-31 ENCOUNTER — Other Ambulatory Visit: Payer: Self-pay

## 2017-03-31 MED ORDER — ALENDRONATE SODIUM 70 MG PO TABS: 70.0000 mg | ORAL_TABLET | ORAL | 3 refills | Status: DC

## 2017-04-06 ENCOUNTER — Other Ambulatory Visit: Payer: Self-pay | Admitting: *Deleted

## 2017-04-06 MED ORDER — ALENDRONATE SODIUM 70 MG PO TABS: 70.0000 mg | ORAL_TABLET | ORAL | 3 refills | Status: DC

## 2017-04-22 DIAGNOSIS — R0789 Other chest pain: Secondary | ICD-10-CM | POA: Insufficient documentation

## 2017-05-14 DIAGNOSIS — Z85828 Personal history of other malignant neoplasm of skin: Secondary | ICD-10-CM | POA: Insufficient documentation

## 2017-05-31 ENCOUNTER — Ambulatory Visit
Admission: RE | Admit: 2017-05-31 | Discharge: 2017-05-31 | Disposition: A | Discharge status: Home / Self Care | Payer: Medicare Other | Source: Ambulatory Visit | Attending: Oncology | Admitting: Oncology

## 2017-05-31 DIAGNOSIS — Z170 Estrogen receptor positive status [ER+]: Secondary | ICD-10-CM | POA: Diagnosis present

## 2017-05-31 DIAGNOSIS — C50411 Malignant neoplasm of upper-outer quadrant of right female breast: Secondary | ICD-10-CM | POA: Insufficient documentation

## 2017-05-31 DIAGNOSIS — M810 Age-related osteoporosis without current pathological fracture: Secondary | ICD-10-CM | POA: Insufficient documentation

## 2017-08-10 DIAGNOSIS — E669 Obesity, unspecified: Secondary | ICD-10-CM | POA: Diagnosis present

## 2017-11-26 ENCOUNTER — Encounter: Payer: Self-pay | Admitting: Nurse Practitioner

## 2017-11-26 ENCOUNTER — Other Ambulatory Visit: Payer: Self-pay

## 2017-11-26 ENCOUNTER — Inpatient Hospital Stay: Payer: Medicare Other

## 2017-11-26 ENCOUNTER — Inpatient Hospital Stay: Payer: Medicare Other | Attending: Oncology | Admitting: Nurse Practitioner

## 2017-11-26 VITALS — BP 128/80 | HR 68 | Temp 97.3°F | Resp 20 | Wt 198.9 lb

## 2017-11-26 DIAGNOSIS — Z170 Estrogen receptor positive status [ER+]: Secondary | ICD-10-CM | POA: Diagnosis not present

## 2017-11-26 DIAGNOSIS — C50411 Malignant neoplasm of upper-outer quadrant of right female breast: Secondary | ICD-10-CM

## 2017-11-26 DIAGNOSIS — M25561 Pain in right knee: Secondary | ICD-10-CM | POA: Diagnosis not present

## 2017-11-26 DIAGNOSIS — Z79899 Other long term (current) drug therapy: Secondary | ICD-10-CM | POA: Insufficient documentation

## 2017-11-26 DIAGNOSIS — M1990 Unspecified osteoarthritis, unspecified site: Secondary | ICD-10-CM | POA: Diagnosis not present

## 2017-11-26 DIAGNOSIS — Z9223 Personal history of estrogen therapy: Secondary | ICD-10-CM | POA: Diagnosis not present

## 2017-11-26 DIAGNOSIS — Z9013 Acquired absence of bilateral breasts and nipples: Secondary | ICD-10-CM | POA: Diagnosis not present

## 2017-11-26 DIAGNOSIS — M25562 Pain in left knee: Secondary | ICD-10-CM | POA: Insufficient documentation

## 2017-11-26 DIAGNOSIS — Z853 Personal history of malignant neoplasm of breast: Secondary | ICD-10-CM

## 2017-11-26 DIAGNOSIS — G8929 Other chronic pain: Secondary | ICD-10-CM | POA: Insufficient documentation

## 2017-11-26 DIAGNOSIS — M810 Age-related osteoporosis without current pathological fracture: Secondary | ICD-10-CM | POA: Insufficient documentation

## 2017-11-26 NOTE — Progress Notes (Signed)
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 She has completed 5 years of letrozole. She continues fosamax, calcium, and vitamin d with her pcp for history of osteoporosis. She had fall in December that resulted in a chipped tooth but no recent falls and no hx of fracture. She continues to have knee pain bilaterally which is chronic and unchanged. She walks regularly on the treadmill for exercise.  Today, she reports feeling well and is asymptomatic. She denies neurologic complaints, denies fever or illness. She denies chest pain or shortness of breath. She has no nausea, vomiting, constipation, or diarrhea. She denies urinary complaints. She offers no specific complaints today.   REVIEW OF SYSTEMS:   Review of Systems  Constitutional: Negative for fever, malaise/fatigue and weight loss.  Respiratory: Negative for cough and shortness of breath.   Cardiovascular: Negative.  Negative for chest pain.  Gastrointestinal: Negative for abdominal pain, blood in stool, constipation, diarrhea, nausea and vomiting.  Genitourinary: Negative.  Negative for hematuria.  Musculoskeletal: Positive for joint pain.  Neurological: Negative.  Negative for dizziness, sensory change, weakness and headaches.  Psychiatric/Behavioral: Negative.  The patient is not nervous/anxious and does not have insomnia.    As per HPI. Otherwise, a complete review of systems is negative.  ONCOLOGY HISTORY: Oncology History   Initially patient presented with skin changes in her right breast and prescribed antibiotics which improved but did not resolve her symptoms. Mammogram  July 2012 revealed abnormal spiculated mass in the upper outer quadrant of right breast with possible satellite nodule. Additional views revealed two masses in upper outer right breast, 2.1 cm and 1 cm. Biopsy of both the right breast lesions revealed invasive ductal carcinoma, grade 3, ER/PR positive and HER2/neu negative.  Received neoadjuvant chemotherapy with Dr. Grayland Ormond.  07/30/2011- bilateral total mastectomies with right axillary node dissection with Dr. Burt Knack.      Breast cancer of upper-outer quadrant of right female breast ([place])   11/26/2011 Initial Diagnosis    Breast cancer of upper-outer quadrant of right female breast [facility])       PAST MEDICAL HISTORY: Past Medical History:  Diagnosis Date  . Arthritis   . Breast cancer ([place]) 11/26/2014  . Cancer ([facility])    breast  . Intermittent palpitations    in early am    PAST SURGICAL HISTORY: Past Surgical History:  Procedure Laterality Date  . ABDOMINAL HYSTERECTOMY    . CATARACT EXTRACTION W/PHACO Left 11/26/2014   Procedure: CATARACT EXTRACTION PHACO AND INTRAOCULAR LENS PLACEMENT (IOC);  Surgeon: Estill Cotta, MD;  Location: [facility] ORS;  Service: Ophthalmology;  Laterality: Left;  US:01:24 AP:26.6 CDE:40.16 PACk [id number] H  . CHOLECYSTECTOMY    . EYE SURGERY    . MASTECTOMY     bilateral   FAMILY HISTORY: Reviewed and unchanged. No reported history of malignancy or chronic disease.  GYNECOLOGIC HISTORY:  No LMP recorded. Patient has had a hysterectomy.   ADVANCED DIRECTIVES: none on file  HEALTH MAINTENANCE: Social History   Tobacco Use  . Smoking status: Never Smoker  . Smokeless tobacco: Never Used  Substance Use Topics  . Alcohol use: No  . Drug use: Not on file  Colonoscopy:  PAP:  Bone density: DG Bone Density 05/31/17- T score -2.5  Lipid panel:  Allergies  Allergen Reactions  . Penicillins Rash    Current Outpatient Medications  Medication Sig Dispense Refill  . alendronate (FOSAMAX)  70 MG tablet Take 1 tablet (70 mg total) by mouth once a week. Take with a full glass of water on an empty stomach. 13 tablet 3  . Calcium Carbonate-Vitamin D (CALCIUM-VITAMIN D) 500-200 MG-UNIT per tablet Take 1 tablet by mouth daily.    Marland Kitchen letrozole (FEMARA) 2.5 MG tablet TAKE 1 TABLET BY MOUTH ONCE DAILY 30 tablet 2  . lisinopril (PRINIVIL,ZESTRIL) 10 MG tablet Take 10 mg by mouth daily.     . naproxen sodium (ANAPROX) 220 MG tablet Take by mouth.     No current facility-administered medications for this visit.     OBJECTIVE: BP 128/80   Pulse 68   Temp (!) 97.3 F (36.3 C) (Tympanic)   Resp 20   Wt 198 lb 13.7 oz (90.2 kg)   BMI 35.23 kg/m    Body mass index is 35.23 kg/m.     ECOG FS:0 - Asymptomatic  General: Well-developed, well-nourished, no acute distress. Eyes: Pink conjunctiva, anicteric sclera. Lungs: Clear to auscultation bilaterally. Heart: Regular rate and rhythm. No rubs, murmurs, or gallops. Abdomen: Soft, nontender, nondistended. No organomegaly noted, normoactive bowel sounds. Musculoskeletal: No edema, cyanosis, or clubbing. Neuro: Alert, answering all questions appropriately. Cranial nerves grossly intact. Skin: No rashes or petechiae noted. Psych: Normal affect. Breast: Bilateral mastectomy, patient requested no exam  LAB RESULTS:  No visits with results within 3 Day(s) from this visit.  Latest known visit with results is:  Appointment on 11/20/2016  Component Date Value Ref Range Status  . WBC 11/20/2016 7.1  3.6 - 11.0 K/uL Final  . RBC 11/20/2016 4.11  3.80 - 5.20 MIL/uL Final  . Hemoglobin 11/20/2016 12.6  12.0 - 16.0 g/dL Final  . HCT 11/20/2016 37.2  35.0 - 47.0 % Final  . MCV 11/20/2016 90.5  80.0 - 100.0 fL Final  . MCH 11/20/2016 30.6  26.0 - 34.0 pg Final  . MCHC 11/20/2016 33.8  32.0 - 36.0 g/dL Final  . RDW 11/20/2016 14.1  11.5 - 14.5 % Final  . Platelets 11/20/2016 162  150 - 440 K/uL Final  . Neutrophils Relative % 11/20/2016 76  %  Final  . Neutro Abs 11/20/2016 5.4  1.4 - 6.5 K/uL Final  . Lymphocytes Relative 11/20/2016 12  % Final  . Lymphs Abs 11/20/2016 0.9* 1.0 - 3.6 K/uL Final  . Monocytes Relative 11/20/2016 7  % Final  . Monocytes Absolute 11/20/2016 0.5  0.2 - 0.9 K/uL Final  . Eosinophils Relative 11/20/2016 4  % Final  . Eosinophils Absolute 11/20/2016 0.3  0 - 0.7 K/uL Final  . Basophils Relative 11/20/2016 1  % Final  . Basophils Absolute 11/20/2016 0.1  0 - 0.1 K/uL Final  . Sodium 11/20/2016 134* 135 - 145 mmol/L Final  . Potassium 11/20/2016 3.9  3.5 - 5.1 mmol/L Final  . Chloride 11/20/2016 101  101 - 111 mmol/L Final  . CO2 11/20/2016 26  22 - 32 mmol/L Final  . Glucose, Bld 11/20/2016 102* 65 - 99 mg/dL Final  . BUN 11/20/2016 22* 6 - 20 mg/dL Final  . Creatinine, Ser 11/20/2016 1.04* 0.44 - 1.00 mg/dL Final  . Calcium 11/20/2016 9.8  8.9 - 10.3 mg/dL Final  . Total Protein 11/20/2016 7.0  6.5 - 8.1  g/dL Final  . Albumin 11/20/2016 4.0  3.5 - 5.0 g/dL Final  . AST 11/20/2016 23  15 - 41 U/L Final  . ALT 11/20/2016 16  14 - 54 U/L Final  . Alkaline Phosphatase 11/20/2016 90  38 - 126 U/L Final  . Total Bilirubin 11/20/2016 0.6  0.3 - 1.2 mg/dL Final  . GFR calc non Af Amer 11/20/2016 51* >60 mL/min Final  . GFR calc Af Amer 11/20/2016 59* >60 mL/min Final   Comment: (NOTE) The eGFR has been calculated using the CKD EPI equation. This calculation has not been validated in all clinical situations. eGFR's persistently <60 mL/min signify possible Chronic Kidney Disease.   . Anion gap 11/20/2016 7  5 - 15 Final  . CA 27.29 11/20/2016 25.6  0.0 - 38.6 U/mL Final   Comment: (NOTE) [employer]/ACS methodology A duplicate report has been generated due to demographic updates. Performed At: [facility] [place], [place] [id number] Lindon Romp MD ZB:[id number]     STUDIES: No results found.  ASSESSMENT: 77 year old female with history of stage IIb ER/PR positive  adenocarcinoma of upper outer quadrant of right breast returns to clinic today for annual follow-up.   PLAN:   1. 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 Has prior history of bilateral mastectomy and therefore does not require mammograms. She completed 5 years of letrozole in 2018 and treatment was discontinued. Ca 27.29 pending today.   2. Osteoporosis: History of osteoporosis, on fosamax, calcium and vitamin d. Now managed by PCP since discontinuation of letrozole.   RTC in 1 year for lab (Ca 27.29) and annual evaluation with Dr. Grayland Ormond.   Patient expressed understanding and was in agreement with this plan. She also understands that She can call clinic at any time with any questions, concerns, or complaints.    Beckey Rutter, DNP, AGNP-C [place] [facility] [phone] (work cell) [phone] (office) 11/26/17 11:27 AM

## 2017-11-26 NOTE — Progress Notes (Signed)
Patient denies any concerns today.  

## 2017-11-27 LAB — CANCER ANTIGEN 27.29: CAN 27.29: 26.9 U/mL (ref 0.0–38.6)

## 2018-05-26 DIAGNOSIS — M7541 Impingement syndrome of right shoulder: Secondary | ICD-10-CM | POA: Insufficient documentation

## 2018-06-08 ENCOUNTER — Other Ambulatory Visit: Payer: Self-pay | Admitting: Oncology

## 2018-06-29 ENCOUNTER — Emergency Department
Admission: EM | Admit: 2018-06-29 | Discharge: 2018-06-29 | Disposition: A | Discharge status: Home / Self Care | Payer: Medicare Other | Attending: Emergency Medicine | Admitting: Emergency Medicine

## 2018-06-29 ENCOUNTER — Encounter: Payer: Self-pay | Admitting: Emergency Medicine

## 2018-06-29 ENCOUNTER — Other Ambulatory Visit: Payer: Self-pay

## 2018-06-29 ENCOUNTER — Ambulatory Visit (INDEPENDENT_AMBULATORY_CARE_PROVIDER_SITE_OTHER)
Admission: EM | Admit: 2018-06-29 | Discharge: 2018-06-29 | Disposition: A | Discharge status: Other Acute Inpatient Hospital | Payer: Medicare Other | Source: Home / Self Care | Attending: Family Medicine | Admitting: Family Medicine

## 2018-06-29 ENCOUNTER — Ambulatory Visit: Payer: Medicare Other

## 2018-06-29 ENCOUNTER — Ambulatory Visit
Admit: 2018-06-29 | Discharge: 2018-06-29 | Disposition: A | Discharge status: Home / Self Care | Payer: Medicare Other | Attending: Family Medicine | Admitting: Family Medicine

## 2018-06-29 DIAGNOSIS — Y999 Unspecified external cause status: Secondary | ICD-10-CM | POA: Diagnosis not present

## 2018-06-29 DIAGNOSIS — Y939 Activity, unspecified: Secondary | ICD-10-CM | POA: Diagnosis not present

## 2018-06-29 DIAGNOSIS — I10 Essential (primary) hypertension: Secondary | ICD-10-CM | POA: Diagnosis not present

## 2018-06-29 DIAGNOSIS — Y92014 Private driveway to single-family (private) house as the place of occurrence of the external cause: Secondary | ICD-10-CM | POA: Diagnosis not present

## 2018-06-29 DIAGNOSIS — S0181XA Laceration without foreign body of other part of head, initial encounter: Secondary | ICD-10-CM

## 2018-06-29 DIAGNOSIS — S02612A Fracture of condylar process of left mandible, initial encounter for closed fracture: Secondary | ICD-10-CM

## 2018-06-29 DIAGNOSIS — Z853 Personal history of malignant neoplasm of breast: Secondary | ICD-10-CM | POA: Insufficient documentation

## 2018-06-29 DIAGNOSIS — W1830XA Fall on same level, unspecified, initial encounter: Secondary | ICD-10-CM | POA: Insufficient documentation

## 2018-06-29 DIAGNOSIS — S02609A Fracture of mandible, unspecified, initial encounter for closed fracture: Secondary | ICD-10-CM | POA: Insufficient documentation

## 2018-06-29 DIAGNOSIS — S02610A Fracture of condylar process of mandible, unspecified side, initial encounter for closed fracture: Secondary | ICD-10-CM

## 2018-06-29 DIAGNOSIS — W19XXXA Unspecified fall, initial encounter: Secondary | ICD-10-CM

## 2018-06-29 MED ORDER — LIDOCAINE-EPINEPHRINE 1 %-1:100000 IJ SOLN
10.0000 mL | Freq: Once | INTRAMUSCULAR | Status: DC
  Filled 2018-06-29: qty 10

## 2018-06-29 MED ORDER — LIDOCAINE-EPINEPHRINE 2 %-1:100000 IJ SOLN: 10.0000 mL | Freq: Once | INTRAMUSCULAR | Status: DC

## 2018-06-29 NOTE — ED Notes (Signed)
Pt verbalizes understanding of d/c instructions, medication and follow up

## 2018-06-29 NOTE — ED Notes (Signed)
ED Provider at bedside. 

## 2018-06-29 NOTE — ED Provider Notes (Signed)
[facility]    CSN: [id number] Arrival date & time: 06/29/18  1016  History   Chief Complaint Chief Complaint  Patient presents with  . Facial Injury  . Laceration   HPI  78 year old female presents for evaluation after suffering a fall.  Patient states that she was going to get the paper this morning.  She states that she lost her balance and fell forward.  She states that she did not slip on anything.  She is unsure of what exactly caused the fall.  Patient fell and injured her left jaw, chin, right hand, and right shoulder.  Patient denies loss of consciousness.  No vision changes.  Patient has a small open wound to the chin.  Patient has noticeable left jaw swelling.  This is her primary concern and complaint at this time.  Has difficulty opening her mouth.  Severe jaw pain.  No other reported symptoms.  No other complaints or complaints at this time.  PMH, Surgical Hx, Family Hx, Social History reviewed and updated as below.  Past Medical History:  Diagnosis Date  . Arthritis   . Breast cancer ([place]) 11/26/2014  . Cancer [facility])    breast  . Intermittent palpitations    in early am    Patient Active Problem List   Diagnosis Date Noted  . Gonalgia 06/10/2015  . Arthritis of knee, degenerative 06/10/2015  . Essential (primary) hypertension 05/10/2015  . Breast cancer of upper-outer quadrant of right female breast ([place]) 11/26/2014  . Primary osteoarthritis of both knees 05/01/2014  . H/O bilateral mastectomy 05/01/2014  . H/O malignant neoplasm of breast 05/01/2014  . Hypercholesteremia 05/01/2014  . Hypercholesterolemia 05/01/2014    Past Surgical History:  Procedure Laterality Date  . ABDOMINAL HYSTERECTOMY    . CATARACT EXTRACTION W/PHACO Left 11/26/2014   Procedure: CATARACT EXTRACTION PHACO AND INTRAOCULAR LENS PLACEMENT (IOC);  Surgeon: Estill Cotta, MD;  Location: [facility] ORS;  Service: Ophthalmology;  Laterality: Left;   US:01:24 AP:26.6 CDE:40.16 PACk [id number] H  . CHOLECYSTECTOMY    . EYE SURGERY    . MASTECTOMY     bilateral   OB History   No obstetric history on file.     Home Medications    Prior to Admission medications   Medication Sig Start Date End Date Taking? Authorizing Provider  alendronate (FOSAMAX) 70 MG tablet TAKE 1 TABLET BY MOUTH ONCE A WEEK TAKE  WITH  A  FULL  GLASS  OF  WATER  ON  AN  EMPTY  STOMACH 06/08/18  Yes Lloyd Huger, MD  Calcium Carbonate-Vitamin D (CALCIUM-VITAMIN D) 500-200 MG-UNIT per tablet Take 1 tablet by mouth daily.   Yes [provider]  letrozole (FEMARA) 2.5 MG tablet TAKE 1 TABLET BY MOUTH ONCE DAILY 09/27/16  Yes Lloyd Huger, MD  lisinopril (PRINIVIL,ZESTRIL) 10 MG tablet Take 10 mg by mouth daily.  11/18/15  Yes [provider]  metoprolol succinate (TOPROL-XL) 25 MG 24 hr tablet Take 25 mg by mouth daily.   Yes [provider]  naproxen sodium (ANAPROX) 220 MG tablet Take by mouth.    [provider]   Social History Social History   Tobacco Use  . Smoking status: Never Smoker  . Smokeless tobacco: Never Used  Substance Use Topics  . Alcohol use: No  . Drug use: Not on file   Allergies   Penicillins  Review of Systems Review of Systems  Musculoskeletal:       Jaw pain and swelling.  Right hand pain and Right shoulder pain.  Skin: Positive for wound.   Physical Exam Triage Vital Signs ED Triage Vitals  Enc Vitals Group     BP 06/29/18 1055 (!) 132/57     Pulse Rate 06/29/18 1055 67     Resp 06/29/18 1055 18     Temp --      Temp src --      SpO2 06/29/18 1055 100 %     Weight 06/29/18 1051 195 lb (88.5 kg)     Height 06/29/18 1051 5\' 3"  (1.6 m)     Head Circumference --      Peak Flow --      Pain Score 06/29/18 1051 8     Pain Loc --      Pain Edu? --      Excl. in [place]? --    Updated Vital Signs BP (!) 132/57 (BP Location: Left Arm)   Pulse 67   Resp 18   Ht 5\' 3"  (1.6  m)   Wt 88.5 kg   SpO2 100%   BMI 34.54 kg/m   Visual Acuity Right Eye Distance:   Left Eye Distance:   Bilateral Distance:    Right Eye Near:   Left Eye Near:    Bilateral Near:     Physical Exam Vitals signs and nursing note reviewed.  Constitutional:      Appearance: Normal appearance.  HENT:     Head:     Jaw: Swelling and pain on movement present.      Comments: Swelling and tenderness at the labelled location.    Nose: Nose normal.  Eyes:     Conjunctiva/sclera: Conjunctivae normal.     Pupils: Pupils are equal, round, and reactive to light.  Cardiovascular:     Rate and Rhythm: Normal rate and regular rhythm.  Pulmonary:     Effort: Pulmonary effort is normal.     Breath sounds: Normal breath sounds.  Musculoskeletal:     Comments: Right hand with mild swelling at the MCP joint of the third and fourth digits.  No tenderness to palpation.  Right shoulder - normal passive range of motion.  Patient with no discrete areas of tenderness.  Skin:    Comments: 1.5 cm chin laceration. 2 facial abrasions noted.  Neurological:     Mental Status: She is alert.     Comments: Alert and oriented.  Psychiatric:        Mood and Affect: Mood normal.        Behavior: Behavior normal.    UC Treatments / Results  Labs (all labs ordered are listed, but only abnormal results are displayed) Labs Reviewed - No data to display  EKG None  Radiology Dg Shoulder Right  Result Date: 06/29/2018 CLINICAL DATA:  Legs gave way and she lost her balance, falling onto concrete, pain at lateral RIGHT shoulder EXAM: RIGHT SHOULDER - 2+ VIEW COMPARISON:  None FINDINGS: Osseous demineralization. AC joint alignment grossly normal. No acute fracture, dislocation, or bone destruction. Visualized RIGHT ribs intact. IMPRESSION: No acute osseous abnormalities. Electronically Signed   By: Lavonia Dana M.D.   On: 06/29/2018 11:49   Dg Hand Complete Right  Result Date: 06/29/2018 CLINICAL DATA:   Fall, injury EXAM: RIGHT HAND - COMPLETE 3+ VIEW COMPARISON:  None FINDINGS: Osseous demineralization. Scattered narrowing and minimal degenerative changes of the DIP joints, PIP joint little finger, first CMC joint, and IP joint of the thumb. No acute fracture, dislocation, or  bone destruction. IMPRESSION: Scattered degenerative changes and osseous demineralization. No acute abnormalities. Electronically Signed   By: Lavonia Dana M.D.   On: 06/29/2018 11:47   Ct Maxillofacial Wo Contrast  Result Date: 06/29/2018 CLINICAL DATA:  Bilateral jaw pain after fall today. EXAM: CT MAXILLOFACIAL WITHOUT CONTRAST TECHNIQUE: Multidetector CT imaging of the maxillofacial structures was performed. Multiplanar CT image reconstructions were also generated. COMPARISON:  None. FINDINGS: Osseous: Severely comminuted and displaced fracture is seen involving the left mandibular condylar process. Orbits: Negative. No traumatic or inflammatory finding. Sinuses: Clear. Soft tissues: Negative. Limited intracranial: No significant or unexpected finding. IMPRESSION: Severely comminuted and displaced fracture seen involving left mandibular condylar process. Electronically Signed   By: Marijo Conception, M.D.   On: 06/29/2018 12:04   Procedures Procedures (including critical care time)  Medications Ordered in UC Medications - No data to display  Initial Impression / Assessment and Plan / UC Course  I have reviewed the triage vital signs and the nursing notes.  Pertinent labs & imaging results that were available during my care of the patient were reviewed by me and considered in my medical decision making (see chart for details).    78 year old female presents for evaluation after suffering a fall.  Patient has a small laceration.  CT facial revealed a severely comminuted left mandible fracture.  Patient going directly to the ER.  Her husband is taken.  Patient needs surgical consultation.   Final Clinical Impressions(s) / UC  Diagnoses   Final diagnoses:  Closed fracture of left side of mandible, unspecified mandibular site, initial encounter [facility])     Discharge Instructions     Go straight to the ER.  Take care  Dr. Lacinda Axon     ED Prescriptions    None     Controlled Substance Prescriptions  Controlled Substance Registry consulted? Not Applicable   Coral Spikes, DO 06/29/18 1211

## 2018-06-29 NOTE — Discharge Instructions (Signed)
Go straight to the ER. ° °Take care ° °Dr. Jaliza Seifried  °

## 2018-06-29 NOTE — ED Triage Notes (Signed)
Patient sent from urgent care. Reports she fell this morning. Laceration noted to chin. Patient also had CT positive for left madible fracture. Patient able to talk without difficulty.

## 2018-06-29 NOTE — ED Notes (Signed)
[facility]  CONSULT  LINE  CALLED PER  DR  Lenise Herald MD

## 2018-06-29 NOTE — ED Triage Notes (Signed)
Patient states she lost her balance this morning while walking to get the paper. She fell on her face hitting her chin and jaw area. She has a laceration to her chin and is complaining of her left jaw hurting. Patient also c/o right hand pain and shoulder pain from when she fell. Patient states she did not have LOC.

## 2018-06-29 NOTE — ED Provider Notes (Signed)
[facility] Emergency Department Provider Note       Time seen: ----------------------------------------- 1:40 PM on 06/29/2018 -----------------------------------------   I have reviewed the triage vital signs and the nursing notes.  HISTORY   Chief Complaint Fall and Laceration    HPI Kristin Mitchell is a 78 y.o. female with a history of arthritis, breast cancer, hyperlipidemia who presents to the ED for fall that occurred today.  Patient states she was in her driveway and she fell striking her chin.  She had outpatient CT scan that was positive for left-sided mandibular condyle fracture.  She is able to talk without any difficulty.  Past Medical History:  Diagnosis Date  . Arthritis   . Breast cancer ([place]) 11/26/2014  . Cancer [facility])    breast  . Intermittent palpitations    in early am    Patient Active Problem List   Diagnosis Date Noted  . Gonalgia 06/10/2015  . Arthritis of knee, degenerative 06/10/2015  . Essential (primary) hypertension 05/10/2015  . Breast cancer of upper-outer quadrant of right female breast ([place]) 11/26/2014  . Primary osteoarthritis of both knees 05/01/2014  . H/O bilateral mastectomy 05/01/2014  . H/O malignant neoplasm of breast 05/01/2014  . Hypercholesteremia 05/01/2014  . Hypercholesterolemia 05/01/2014    Past Surgical History:  Procedure Laterality Date  . ABDOMINAL HYSTERECTOMY    . CATARACT EXTRACTION W/PHACO Left 11/26/2014   Procedure: CATARACT EXTRACTION PHACO AND INTRAOCULAR LENS PLACEMENT (IOC);  Surgeon: Estill Cotta, MD;  Location: [facility] ORS;  Service: Ophthalmology;  Laterality: Left;  US:01:24 AP:26.6 CDE:40.16 PACk [id number] H  . CHOLECYSTECTOMY    . EYE SURGERY    . MASTECTOMY     bilateral    Allergies Penicillins  Social History Social History   Tobacco Use  . Smoking status: Never Smoker  . Smokeless tobacco: Never Used  Substance Use Topics  . Alcohol use: No  .  Drug use: Not on file   Review of Systems Constitutional: Negative for fever. ENT: Positive for jaw pain, chin laceration Cardiovascular: Negative for chest pain. Respiratory: Negative for shortness of breath. Gastrointestinal: Negative for abdominal pain, vomiting and diarrhea. Musculoskeletal: Negative for back pain.  Positive for arm pain Skin: Negative for rash. Neurological: Negative for headaches  All systems negative/normal/unremarkable except as stated in the HPI  ____________________________________________   PHYSICAL EXAM:  VITAL SIGNS: ED Triage Vitals [06/29/18 1312]  Enc Vitals Group     BP (!) 131/49     Pulse Rate 70     Resp 16     Temp 98.4 F (36.9 C)     Temp Source Oral     SpO2 100 %     Weight 195 lb (88.5 kg)     Height 5\' 3"  (1.6 m)     Head Circumference      Peak Flow      Pain Score 8     Pain Loc      Pain Edu?      Excl. in [place]?    Constitutional: Alert and oriented. Well appearing and in no distress. ENT      Head: Normocephalic and atraumatic.      Nose: No congestion/rhinnorhea.      Mouth/Throat: Mucous membranes are moist.  2.5 cm laceration noted over the submental area of the chin in the midline.  Left-sided mandibular tenderness      Neck: No stridor. Cardiovascular: Normal rate, regular rhythm. No murmurs, rubs, or gallops. Respiratory: Normal respiratory  effort without tachypnea nor retractions. Breath sounds are clear and equal bilaterally. No wheezes/rales/rhonchi. Musculoskeletal: Nontender with normal range of motion in extremities. No lower extremity tenderness nor edema. Neurologic:  Normal speech and language. No gross focal neurologic deficits are appreciated.  Skin: Chin laceration as dictated above ____________________________________________  ED COURSE:  As part of my medical decision making, I reviewed the following data within the [place] History obtained from family if available, nursing  notes, old chart and ekg, as well as notes from prior ED visits. Patient presented for fall with resulting chin laceration and mandible fracture, patient will require suture repair.   Marland Kitchen.Laceration Repair Date/Time: 06/29/2018 2:24 PM Performed by: Earleen Newport, MD Authorized by: Earleen Newport, MD   Consent:    Consent obtained:  Verbal   Consent given by:  Patient   Risks discussed:  Infection, pain, retained foreign body, poor cosmetic result and poor wound healing Anesthesia (see MAR for exact dosages):    Anesthesia method:  Local infiltration   Local anesthetic:  Lidocaine 2% WITH epi Laceration details:    Location:  Face   Face location:  Chin   Length (cm):  2.5   Depth (mm):  5 Repair type:    Repair type:  Simple Exploration:    Hemostasis achieved with:  Direct pressure   Wound exploration: entire depth of wound probed and visualized     Contaminated: no   Treatment:    Area cleansed with:  Saline   Amount of cleaning:  Extensive   Irrigation solution:  Sterile saline   Visualized foreign bodies/material removed: no   Skin repair:    Repair method:  Sutures   Suture size:  4-0   Suture technique:  Simple interrupted   Number of sutures:  6 Approximation:    Approximation:  Close Post-procedure details:    Dressing:  Sterile dressing   Patient tolerance of procedure:  Tolerated well, no immediate complications    RADIOLOGY Images were viewed by me CT maxillofacial IMPRESSION: Severely comminuted and displaced fracture seen involving left mandibular condylar process.  ____________________________________________   DIFFERENTIAL DIAGNOSIS   Fall, fracture, laceration  FINAL ASSESSMENT AND PLAN  Fall, mandible fracture, chin laceration   Plan: The patient had presented for mechanical fall.  Wound was repaired as dictated above.  I have discussed with plastic surgery at [facility] who will follow up with her on Friday for reevaluation  concerning her mandible fracture.  She is cleared for outpatient follow-up.   Laurence Aly, MD    Note: This note was generated in part or whole with voice recognition software. Voice recognition is usually quite accurate but there are transcription errors that can and very often do occur. I apologize for any typographical errors that were not detected and corrected.     Earleen Newport, MD 06/29/18 1425

## 2018-07-01 DIAGNOSIS — S02609A Fracture of mandible, unspecified, initial encounter for closed fracture: Secondary | ICD-10-CM | POA: Insufficient documentation

## 2018-10-31 ENCOUNTER — Other Ambulatory Visit: Payer: Self-pay | Admitting: Oncology

## 2018-11-24 ENCOUNTER — Other Ambulatory Visit: Payer: Self-pay

## 2018-11-24 DIAGNOSIS — C50411 Malignant neoplasm of upper-outer quadrant of right female breast: Secondary | ICD-10-CM

## 2018-11-24 DIAGNOSIS — Z170 Estrogen receptor positive status [ER+]: Secondary | ICD-10-CM

## 2018-11-24 NOTE — Progress Notes (Signed)
[facility]  [address] [place], [place] [address] Phone: [phone]  Fax: [phone]   Clinic Day:  11/25/2018  Referring physician: Alanson Aly, FNP  Chief Complaint: Kristin Mitchell is a 78 y.o. female with stage IIB right breast cancer who is seen for new patient assessment.  HPI:  The patient was diagnosed with stage IIB right breast cancer in 2012.  She was noted to have skin changes in the right breast.  The cancer was detected on a routine screening mammogram on 12/04/2010. Diagnostic mammogram showed a mass in the upper outer right breast measuring 2.0 x 0.8 x 0.9cm with an adjacent smaller lesion measuring 1.0 x 0.8 x 0.8cm and two enlarged axillary lymph nodes.   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   Breast MRI on 01/01/2011 showed two areas of enhancement in the right breast 1cm apart, measuring 2.7cm and 2.6cm, with a 2.6cm area of enhancement and significant satellite lesions spanning total 6cm.   PET scan on 12/25/2010 revealed the known right breast mass and enlarged axillary lymph nodes. There was no evidence of abnormal uptake elsewhere in the chest, neck, abdomen, or pelvis.  She was felt to have clinical T2 (multifocal) N1 right breast cancer.  She received neoadjuvant chemotherapy (unknowntype).  She tolerated it well.   She underwent bilateral mastectomy with a right axillary lymph node dissection by Dr. Remo Lipps Dingeldein on 07/22/2011.  Pathology confirmed no residual carcinoma and 3 negative right axillary lymph nodes.   She was placed on Femara and completed 5 years of therapy in 08/2016.   CA27.29 has been followed: 29.6 on 11/23/2012, 27.8 on 11/23/2014, 28.5 on 05/24/2015, 24.7 on 11/22/2015, 27.4 on 05/22/2016, 25.6 on 11/20/2017, and 26.9 on 11/26/2017.  She has been followed by Dr. Grayland Ormond.  Notes  reviewed.   Bone density on 01/05/2012 showed osteopenia with a T-score of -2.3 in the AP spine L1-L4. Bone density on 01/11/2013 showed osteopenia with a T-score of -2.3 in the AP spine L1-L4. Bone density on 01/12/2014 showed osteopenia with a T-score of -2.4 at the AP spine. Bone density on 05/30/2015 showed osteoporosis with a T-score of -2.5 in the AP spine L1-L4.  Bone density  on 05/31/2017 showed osteoporosis with a T-score of -2.5 in the AP spine L1-L4.   The patient was last seen in the medical oncology clinic on 11/26/2017 by Beckey Rutter, NP.  At that time, she was doing well and was asymptomatic. She had chronic bilateral knee pain. She walked regularly. She continued on Fosamax, calcium, and vitamin D.   During the interim, she is doing "fine." She reports bone pain in her right arm that has been going on for several months. She reports fatigue, and often has to rest after doing something. Her PCP gave her a topical cream which has provided improvement. She denies any fevers or sweats. She reports weight loss after falling and breaking her jaw in 06/2018 and having her jaw wired.  Right hand and shoulder films on 06/29/2018 revealed no fracture.  She does not eat meat regularly. She tries to eat leafy, green vegetables. She stays well hydrated with water. She denies any blood in her stools or black stools. She has restless legs and occasional tingling in her legs at night. She denies any ice pica. She has never had a colonoscopy but did Cologuard testing once.    Past  Medical History:  Diagnosis Date  . Arthritis   . Breast cancer ([place]) 11/26/2014  . Cancer [facility])    breast  . Intermittent palpitations    in early am    Past Surgical History:  Procedure Laterality Date  . ABDOMINAL HYSTERECTOMY    . CATARACT EXTRACTION W/PHACO Left 11/26/2014   Procedure: CATARACT EXTRACTION PHACO AND INTRAOCULAR LENS PLACEMENT (IOC);  Surgeon: Estill Cotta, MD;  Location: [facility] ORS;  Service:  Ophthalmology;  Laterality: Left;  US:01:24 AP:26.6 CDE:40.16 PACk [id number] H  . CHOLECYSTECTOMY    . EYE SURGERY    . MASTECTOMY     bilateral    History reviewed. No pertinent family history.  Social History:  reports that she has never smoked. She has never used smokeless tobacco. She reports that she does not drink alcohol. No history on file for drug. She does not drink or smoke. She denies any known exposure to radiation or toxins. 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 She lives in [place]. The patient is alone today.  Allergies:  Allergies  Allergen Reactions  . Penicillins Rash    Current Medications: Current Outpatient Medications  Medication Sig Dispense Refill  . acetaminophen (TYLENOL) 500 MG tablet Take 500 mg by mouth every 6 (six) hours as needed.     Marland Kitchen alendronate (FOSAMAX) 70 MG tablet TAKE 1 TABLET BY MOUTH ONCE A WEEK WITH A FULL GLASS OF WATER ON AN EMPTY STOMACH 12 tablet 0  . Calcium Carb-Cholecalciferol (CALCIUM 600+D) 600-800 MG-UNIT TABS Take 1 tablet by mouth daily.    . Cholecalciferol (VITAMIN D3) 50 MCG (2000 UT) TABS Take 1 tablet by mouth daily.    Marland Kitchen lisinopril (PRINIVIL,ZESTRIL) 10 MG tablet Take 5 mg by mouth daily.     Marland Kitchen lovastatin (MEVACOR) 10 MG tablet Take 10 mg by mouth at bedtime.     . metoprolol succinate (TOPROL-XL) 25 MG 24 hr tablet Take 25 mg by mouth daily.    . vitamin B-12 (CYANOCOBALAMIN) 1000 MCG tablet Take 1,000 mcg by mouth daily.     No current facility-administered medications for this visit.     Review of Systems  Constitutional: Positive for malaise/fatigue and weight loss (after breaking jaw). Negative for chills, diaphoresis and fever.  HENT: Negative.  Negative for congestion, hearing loss, sinus pain and sore throat.   Eyes: Negative.  Negative for blurred vision.  Respiratory: Negative.  Negative for cough, shortness of breath and wheezing.   Cardiovascular: Negative.  Negative for chest  pain, palpitations, orthopnea, leg swelling and PND.  Gastrointestinal: Negative.  Negative for abdominal pain, blood in stool, constipation, diarrhea, melena, nausea and vomiting.  Genitourinary: Negative.  Negative for dysuria, frequency, hematuria and urgency.  Musculoskeletal: Negative.  Negative for back pain, falls, joint pain and myalgias.       Bone pain in upper right arm.  Skin: Negative.  Negative for rash.  Neurological: Positive for tingling (in legs, at night). Negative for dizziness, sensory change, weakness and headaches.  Endo/Heme/Allergies: Negative.  Does not bruise/bleed easily.  Psychiatric/Behavioral: Negative.  Negative for depression, memory loss and substance abuse. The patient is not nervous/anxious and does not have insomnia.   All other systems reviewed and are negative.  Performance status (ECOG): 1  Vitals Blood pressure 137/79, pulse 61, temperature 97.9 F (36.6 C), temperature source Oral, resp. rate 16, weight 178 lb 7.4 oz (81 kg).   Physical Exam  Constitutional: She is oriented to person,  place, and time. She appears well-developed and well-nourished. No distress.  She has a rolling walker by her side.  HENT:  Head: Normocephalic and atraumatic.  Mouth/Throat: Oropharynx is clear and moist. No oropharyngeal exudate.  Gray hair. Mask.  Eyes: Pupils are equal, round, and reactive to light. Conjunctivae and EOM are normal. No scleral icterus.  Hazel eyes.   Neck: Normal range of motion. Neck supple. No JVD present.  Cardiovascular: Normal rate, regular rhythm and normal heart sounds.  No murmur heard. Pulmonary/Chest: Effort normal and breath sounds normal. No respiratory distress. She has no wheezes. She has no rales. Right breast exhibits no mass, no skin change and no tenderness (numbness at incision). Left breast exhibits no mass, no skin change and no tenderness. Breasts are symmetrical (s/p bilateral mastectomies).  Abdominal: Soft. Bowel sounds  are normal. She exhibits no distension and no mass. There is no abdominal tenderness. There is no rebound and no guarding.  Musculoskeletal: Normal range of motion.        General: No edema.     Right upper arm: She exhibits no tenderness and no bony tenderness.  Lymphadenopathy:    She has no cervical adenopathy.    She has no axillary adenopathy.       Right: No inguinal and no supraclavicular adenopathy present.       Left: No inguinal and no supraclavicular adenopathy present.  Neurological: She is alert and oriented to person, place, and time. Gait (uses walker to ambulate) abnormal.  Skin: Skin is warm and dry. She is not diaphoretic. No erythema.  Psychiatric: She has a normal mood and affect. Her behavior is normal. Judgment and thought content normal.  Nursing note and vitals reviewed.   Appointment on 11/25/2018  Component Date Value Ref Range Status  . Sodium 11/25/2018 130* 135 - 145 mmol/L Final  . Potassium 11/25/2018 3.7  3.5 - 5.1 mmol/L Final  . Chloride 11/25/2018 99  98 - 111 mmol/L Final  . CO2 11/25/2018 23  22 - 32 mmol/L Final  . Glucose, Bld 11/25/2018 90  70 - 99 mg/dL Final  . BUN 11/25/2018 22  8 - 23 mg/dL Final  . Creatinine, Ser 11/25/2018 1.09* 0.44 - 1.00 mg/dL Final  . Calcium 11/25/2018 9.2  8.9 - 10.3 mg/dL Final  . Total Protein 11/25/2018 6.8  6.5 - 8.1 g/dL Final  . Albumin 11/25/2018 3.7  3.5 - 5.0 g/dL Final  . AST 11/25/2018 21  15 - 41 U/L Final  . ALT 11/25/2018 16  0 - 44 U/L Final  . Alkaline Phosphatase 11/25/2018 92  38 - 126 U/L Final  . Total Bilirubin 11/25/2018 0.7  0.3 - 1.2 mg/dL Final  . GFR calc non Af Amer 11/25/2018 49* >60 mL/min Final  . GFR calc Af Amer 11/25/2018 56* >60 mL/min Final  . Anion gap 11/25/2018 8  5 - 15 Final   Performed at [facility], [address], [place], [place] [address]  . WBC 11/25/2018 6.6  4.0 - 10.5 K/uL Final  . RBC 11/25/2018 3.65* 3.87 - 5.11 MIL/uL Final  . Hemoglobin  11/25/2018 11.4* 12.0 - 15.0 g/dL Final  . HCT 11/25/2018 33.8* 36.0 - 46.0 % Final  . MCV 11/25/2018 92.6  80.0 - 100.0 fL Final  . MCH 11/25/2018 31.2  26.0 - 34.0 pg Final  . MCHC 11/25/2018 33.7  30.0 - 36.0 g/dL Final  . RDW 11/25/2018 13.4  11.5 - 15.5 % Final  .  Platelets 11/25/2018 123* 150 - 400 K/uL Final  . nRBC 11/25/2018 0.0  0.0 - 0.2 % Final  . Neutrophils Relative % 11/25/2018 73  % Final  . Neutro Abs 11/25/2018 4.9  1.7 - 7.7 K/uL Final  . Lymphocytes Relative 11/25/2018 15  % Final  . Lymphs Abs 11/25/2018 1.0  0.7 - 4.0 K/uL Final  . Monocytes Relative 11/25/2018 8  % Final  . Monocytes Absolute 11/25/2018 0.6  0.1 - 1.0 K/uL Final  . Eosinophils Relative 11/25/2018 2  % Final  . Eosinophils Absolute 11/25/2018 0.2  0.0 - 0.5 K/uL Final  . Basophils Relative 11/25/2018 1  % Final  . Basophils Absolute 11/25/2018 0.0  0.0 - 0.1 K/uL Final  . Immature Granulocytes 11/25/2018 1  % Final  . Abs Immature Granulocytes 11/25/2018 0.03  0.00 - 0.07 K/uL Final   Performed at [facility], [address], [place], [place] [address]    Assessment:  Kristin Mitchell is a 78 y.o. female with stage IIB right breast cancer s/p neoadjuvant chemotherapy and bilateral mastectomy with right axillary lymph node dissection. 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   Diagnostic mammogram revealed a mass in the upper outer right breast measuring 2.0 x 0.8 x 0.9cm with an adjacent smaller lesion measuring 1.0 x 0.8 x 0.8cm and two enlarged axillary lymph nodes. Breast MRI on 01/01/2011 showed two areas of enhancement in the right breast 1cm apart, measuring 2.7cm and 2.6cm, with a 2.6cm area of enhancement and significant satellite lesions spanning total 6cm.   PET scan on 12/25/2010 revealed the known right breast mass and enlarged axillary lymph nodes. There was no evidence of  abnormal uptake elsewhere in the chest, neck, abdomen, or pelvis.  She was felt to have clinical T2 (multifocal) N1 right breast cancer.  She received neoadjuvant chemotherapy (unknown).  She underwent bilateral mastectomy with right axillary lymph node dissection on 07/22/2011.  Pathology confirmed no residual carcinoma and 3 negative right axillary lymph nodes.   She received Femara x 5 years  (completed 08/2016).   CA27.29 has been followed: 29.6 on 11/23/2012, 27.8 on 11/23/2014, 28.5 on 05/24/2015, 24.7 on 11/22/2015, 27.4 on 05/22/2016, 25.6 on 11/20/2017, 26.9 on 11/26/2017, and 25.8 on 11/25/2018.  Bone density on 01/05/2012 showed osteopenia with a T-score of -2.3 in the AP spine L1-L4.  Bone density on 01/11/2013 showed osteopenia with a T-score of -2.3 in the AP spine L1-L4.  Bone density on 01/12/2014 showed osteopenia with a T-score of -2.4.   Bone density on 05/30/2015 showed osteoporosis with a T-score of -2.5 in the AP spine L1-L4.   Bone density on 05/31/2017 showed osteoporosis with a T-score of -2.5 in the AP spine L1-L4.   Symptomatically, she is doing fine.  She has some right arm pain since her fall in 06/2018.  Exam reveals no evidence of recurrent disease.  Hemoglobin is 11.4.  Plan: 1.   Labs today:  CBC with diff, CMP, CA27.29. 2.   Stage IIB right breast cancer  Review entire medical history, diagnosis and management of breast cancer.  She received neoadjuvant chemotherapy followed by surgery and 5 years of Femara.  Obtain records regarding chemotherapy.  Continue yearly surveillance, 3.   Mild normocytic anemia  Patient denies any melena melena, hematochezia, hematuria or vaginal bleeding.  She has had negative Cologuard testing in the past x1.  Diet is modest.  Check ferritin, iron studies, B12, folate. 4.   Hyponatremia  Sodium 130.  Patient to follow-up with Toni Arthurs, NP.  Labs faxed to PCP. 5.   Osteoporosis  Review serial bone density studies.   Bone density on 06/01/2019. 6.   RTC in 1 year for MD assessment, labs (CBC with diff, CMP, CA27.29).  I discussed the assessment and treatment plan with the patient.  The patient was provided an opportunity to ask questions and all were answered.  The patient agreed with the plan and demonstrated an understanding of the instructions.  The patient was advised to call back if the symptoms worsen or if the condition fails to improve as anticipated.    Lequita Asal, MD, PhD    11/25/2018, 11:45 AM  I, Molly Dorshimer, am acting as Education administrator for [employer]. Mike Gip, MD, PhD.  I, Melissa C. Mike Gip, MD, have reviewed the above documentation for accuracy and completeness, and I agree with the above.

## 2018-11-25 ENCOUNTER — Other Ambulatory Visit: Payer: Self-pay

## 2018-11-25 ENCOUNTER — Encounter: Payer: Self-pay | Admitting: Hematology and Oncology

## 2018-11-25 ENCOUNTER — Inpatient Hospital Stay (HOSPITAL_BASED_OUTPATIENT_CLINIC_OR_DEPARTMENT_OTHER): Payer: Medicare Other | Admitting: Hematology and Oncology

## 2018-11-25 ENCOUNTER — Inpatient Hospital Stay: Payer: Medicare Other | Attending: Hematology and Oncology

## 2018-11-25 ENCOUNTER — Ambulatory Visit: Payer: Medicare Other | Admitting: Oncology

## 2018-11-25 ENCOUNTER — Telehealth: Payer: Self-pay

## 2018-11-25 VITALS — BP 137/79 | HR 61 | Temp 97.9°F | Resp 16 | Wt 178.5 lb

## 2018-11-25 DIAGNOSIS — G2581 Restless legs syndrome: Secondary | ICD-10-CM

## 2018-11-25 DIAGNOSIS — R5381 Other malaise: Secondary | ICD-10-CM | POA: Insufficient documentation

## 2018-11-25 DIAGNOSIS — M810 Age-related osteoporosis without current pathological fracture: Secondary | ICD-10-CM | POA: Insufficient documentation

## 2018-11-25 DIAGNOSIS — R5383 Other fatigue: Secondary | ICD-10-CM | POA: Diagnosis not present

## 2018-11-25 DIAGNOSIS — D649 Anemia, unspecified: Secondary | ICD-10-CM

## 2018-11-25 DIAGNOSIS — Z9223 Personal history of estrogen therapy: Secondary | ICD-10-CM | POA: Insufficient documentation

## 2018-11-25 DIAGNOSIS — R202 Paresthesia of skin: Secondary | ICD-10-CM | POA: Diagnosis not present

## 2018-11-25 DIAGNOSIS — Z79899 Other long term (current) drug therapy: Secondary | ICD-10-CM

## 2018-11-25 DIAGNOSIS — M1990 Unspecified osteoarthritis, unspecified site: Secondary | ICD-10-CM | POA: Diagnosis not present

## 2018-11-25 DIAGNOSIS — M79601 Pain in right arm: Secondary | ICD-10-CM

## 2018-11-25 DIAGNOSIS — E871 Hypo-osmolality and hyponatremia: Secondary | ICD-10-CM | POA: Diagnosis not present

## 2018-11-25 DIAGNOSIS — M818 Other osteoporosis without current pathological fracture: Secondary | ICD-10-CM

## 2018-11-25 DIAGNOSIS — Z8781 Personal history of (healed) traumatic fracture: Secondary | ICD-10-CM | POA: Diagnosis not present

## 2018-11-25 DIAGNOSIS — Z923 Personal history of irradiation: Secondary | ICD-10-CM | POA: Diagnosis not present

## 2018-11-25 DIAGNOSIS — Z9221 Personal history of antineoplastic chemotherapy: Secondary | ICD-10-CM

## 2018-11-25 DIAGNOSIS — Z170 Estrogen receptor positive status [ER+]: Secondary | ICD-10-CM

## 2018-11-25 DIAGNOSIS — Z853 Personal history of malignant neoplasm of breast: Secondary | ICD-10-CM | POA: Diagnosis not present

## 2018-11-25 DIAGNOSIS — Z9013 Acquired absence of bilateral breasts and nipples: Secondary | ICD-10-CM

## 2018-11-25 DIAGNOSIS — C50411 Malignant neoplasm of upper-outer quadrant of right female breast: Secondary | ICD-10-CM

## 2018-11-25 LAB — RETICULOCYTES
Immature Retic Fract: 6.9 % (ref 2.3–15.9)
RBC.: 3.69 MIL/uL — ABNORMAL LOW (ref 3.87–5.11)
Retic Count, Absolute: 47.2 10*3/uL (ref 19.0–186.0)
Retic Ct Pct: 1.3 % (ref 0.4–3.1)

## 2018-11-25 LAB — COMPREHENSIVE METABOLIC PANEL
ALT: 16 U/L (ref 0–44)
AST: 21 U/L (ref 15–41)
Albumin: 3.7 g/dL (ref 3.5–5.0)
Alkaline Phosphatase: 92 U/L (ref 38–126)
Anion gap: 8 (ref 5–15)
BUN: 22 mg/dL (ref 8–23)
CO2: 23 mmol/L (ref 22–32)
Calcium: 9.2 mg/dL (ref 8.9–10.3)
Chloride: 99 mmol/L (ref 98–111)
Creatinine, Ser: 1.09 mg/dL — ABNORMAL HIGH (ref 0.44–1.00)
GFR calc Af Amer: 56 mL/min — ABNORMAL LOW (ref >60)
GFR calc non Af Amer: 49 mL/min — ABNORMAL LOW (ref >60)
Glucose, Bld: 90 mg/dL (ref 70–99)
Potassium: 3.7 mmol/L (ref 3.5–5.1)
Sodium: 130 mmol/L — ABNORMAL LOW (ref 135–145)
Total Bilirubin: 0.7 mg/dL (ref 0.3–1.2)
Total Protein: 6.8 g/dL (ref 6.5–8.1)

## 2018-11-25 LAB — CBC WITH DIFFERENTIAL/PLATELET
Abs Immature Granulocytes: 0.03 10*3/uL (ref 0.00–0.07)
Basophils Absolute: 0 10*3/uL (ref 0.0–0.1)
Basophils Relative: 1 %
Eosinophils Absolute: 0.2 10*3/uL (ref 0.0–0.5)
Eosinophils Relative: 2 %
HCT: 33.8 % — ABNORMAL LOW (ref 36.0–46.0)
Hemoglobin: 11.4 g/dL — ABNORMAL LOW (ref 12.0–15.0)
Immature Granulocytes: 1 %
Lymphocytes Relative: 15 %
Lymphs Abs: 1 10*3/uL (ref 0.7–4.0)
MCH: 31.2 pg (ref 26.0–34.0)
MCHC: 33.7 g/dL (ref 30.0–36.0)
MCV: 92.6 fL (ref 80.0–100.0)
Monocytes Absolute: 0.6 10*3/uL (ref 0.1–1.0)
Monocytes Relative: 8 %
Neutro Abs: 4.9 10*3/uL (ref 1.7–7.7)
Neutrophils Relative %: 73 %
Platelets: 123 10*3/uL — ABNORMAL LOW (ref 150–400)
RBC: 3.65 MIL/uL — ABNORMAL LOW (ref 3.87–5.11)
RDW: 13.4 % (ref 11.5–15.5)
WBC: 6.6 10*3/uL (ref 4.0–10.5)
nRBC: 0 % (ref 0.0–0.2)

## 2018-11-25 LAB — VITAMIN B12: Vitamin B-12: 536 pg/mL (ref 180–914)

## 2018-11-25 LAB — FOLATE: Folate: 12 ng/mL (ref >5.9)

## 2018-11-25 LAB — IRON AND TIBC
Iron: 79 ug/dL (ref 28–170)
Saturation Ratios: 23 % (ref 10.4–31.8)
TIBC: 340 ug/dL (ref 250–450)
UIBC: 261 ug/dL

## 2018-11-25 LAB — FERRITIN: Ferritin: 203 ng/mL (ref 11–307)

## 2018-11-25 NOTE — Telephone Encounter (Signed)
-----   Message from Lequita Asal, MD sent at 11/25/2018  4:46 PM EDT ----- Regarding: Please forward to PCP (? duplicate)  ----- Message ----- From: Verlon Au, NP Sent: 11/25/2018   3:01 PM EDT To: Lequita Asal, MD   ----- Message ----- From: Buel Ream, Lab In [place] Sent: 11/25/2018  10:37 AM EDT To: Verlon Au, NP

## 2018-11-25 NOTE — Telephone Encounter (Signed)
Labs forwarded to Dr. Payton Mccallum (PCP) per Dr. Kem Parkinson request.

## 2018-11-25 NOTE — Progress Notes (Signed)
Pt here for follow up. Previous Dr. Gary Fleet patient. Denies any concerns.

## 2018-11-26 LAB — CANCER ANTIGEN 27.29: CA 27.29: 25.8 U/mL (ref 0.0–38.6)

## 2018-12-13 ENCOUNTER — Other Ambulatory Visit: Payer: Self-pay

## 2018-12-13 ENCOUNTER — Ambulatory Visit
Admission: RE | Admit: 2018-12-13 | Discharge: 2018-12-13 | Disposition: A | Discharge status: Home / Self Care | Payer: Medicare Other | Source: Ambulatory Visit | Attending: Hematology and Oncology | Admitting: Hematology and Oncology

## 2018-12-13 DIAGNOSIS — M818 Other osteoporosis without current pathological fracture: Secondary | ICD-10-CM | POA: Insufficient documentation

## 2018-12-14 ENCOUNTER — Telehealth: Payer: Self-pay

## 2018-12-14 NOTE — Telephone Encounter (Signed)
-----   Message from Lequita Asal, MD sent at 12/13/2018  4:16 PM EDT ----- Regarding: Please notify patient of bone denisty- slightly improved  ----- Message ----- From: Interface, Rad Results In Sent: 12/13/2018   1:41 PM EDT To: Lequita Asal, MD

## 2018-12-14 NOTE — Telephone Encounter (Signed)
spoke with the patient to inform her that her Bone density has slightly improved. The patient was understanding.

## 2019-01-22 ENCOUNTER — Other Ambulatory Visit: Payer: Self-pay | Admitting: Oncology

## 2019-01-28 ENCOUNTER — Other Ambulatory Visit: Payer: Self-pay | Admitting: Oncology

## 2019-01-28 NOTE — Telephone Encounter (Signed)
I think you took over care in July.

## 2019-01-30 ENCOUNTER — Other Ambulatory Visit: Payer: Self-pay | Admitting: Hematology and Oncology

## 2019-01-30 DIAGNOSIS — M818 Other osteoporosis without current pathological fracture: Secondary | ICD-10-CM

## 2019-01-30 MED ORDER — ALENDRONATE SODIUM 70 MG PO TABS: ORAL_TABLET | ORAL | 0 refills | Status: DC

## 2019-04-09 ENCOUNTER — Other Ambulatory Visit: Payer: Self-pay | Admitting: Hematology and Oncology

## 2019-04-09 DIAGNOSIS — M818 Other osteoporosis without current pathological fracture: Secondary | ICD-10-CM

## 2019-07-10 ENCOUNTER — Other Ambulatory Visit: Payer: Self-pay | Admitting: Hematology and Oncology

## 2019-07-10 DIAGNOSIS — M818 Other osteoporosis without current pathological fracture: Secondary | ICD-10-CM

## 2019-09-25 DIAGNOSIS — I739 Peripheral vascular disease, unspecified: Secondary | ICD-10-CM | POA: Insufficient documentation

## 2019-10-10 ENCOUNTER — Ambulatory Visit (INDEPENDENT_AMBULATORY_CARE_PROVIDER_SITE_OTHER): Payer: Medicare Other | Admitting: Vascular Surgery

## 2019-10-10 ENCOUNTER — Other Ambulatory Visit: Payer: Self-pay

## 2019-10-10 ENCOUNTER — Encounter (INDEPENDENT_AMBULATORY_CARE_PROVIDER_SITE_OTHER): Payer: Self-pay | Admitting: Vascular Surgery

## 2019-10-10 VITALS — BP 140/61 | HR 65 | Resp 16 | Ht 62.0 in | Wt 190.8 lb

## 2019-10-10 DIAGNOSIS — M79604 Pain in right leg: Secondary | ICD-10-CM

## 2019-10-10 DIAGNOSIS — I10 Essential (primary) hypertension: Secondary | ICD-10-CM

## 2019-10-10 DIAGNOSIS — M79609 Pain in unspecified limb: Secondary | ICD-10-CM | POA: Insufficient documentation

## 2019-10-10 DIAGNOSIS — E7800 Pure hypercholesterolemia, unspecified: Secondary | ICD-10-CM

## 2019-10-10 DIAGNOSIS — M79605 Pain in left leg: Secondary | ICD-10-CM | POA: Diagnosis not present

## 2019-10-10 NOTE — Patient Instructions (Signed)

## 2019-10-10 NOTE — Assessment & Plan Note (Signed)
lipid control important in reducing the progression of atherosclerotic disease. Continue statin therapy  

## 2019-10-10 NOTE — Assessment & Plan Note (Signed)
Recommend:  The patient has atypical pain symptoms for pure atherosclerotic disease. However, on physical exam there is evidence of mixed venous and arterial disease, given the diminished pulses and the edema associated with venous changes of the legs.  Noninvasive studies including ABI's and venous ultrasound of the legs will be obtained and the patient will follow up with me to review these studies.  I suspect the patient is c/o pseudoclaudication.  Patient should have an evaluation of his LS spine which I defer to the primary service.  The patient should continue walking and begin a more formal exercise program. The patient should continue his antiplatelet therapy and aggressive treatment of the lipid abnormalities.  The patient should begin wearing graduated compression socks 15-20 mmHg strength to control edema.  

## 2019-10-10 NOTE — Assessment & Plan Note (Signed)
blood pressure control important in reducing the progression of atherosclerotic disease. On appropriate oral medications.  

## 2019-10-10 NOTE — Progress Notes (Signed)
Patient ID: Kristin Mitchell, female   DOB: June 05, 1940, 79 y.o.   MRN: [id number]  Chief Complaint  Patient presents with  . New Patient (Initial Visit)    ref Kristin Mitchell PVD    HPI Kristin Mitchell is a 79 y.o. female.  I am asked to see the patient by Littie Deeds for evaluation of leg pain.  The patient states she has always had "big legs", but they have become more painful and swollen over the past few years.  Both legs are affected but the right leg may be a little worse.  The pain starts in the thighs and calves and radiates down her legs.  Walking makes this worse.  Nothing really seems to make it much better.  She denies any previous history of DVT or superficial thrombophlebitis.  No chest pain or shortness of breath.  No fevers or chills.  No open wounds or infection.     Past Medical History:  Diagnosis Date  . Arthritis   . Breast cancer ([place]) 11/26/2014  . Cancer [facility])    breast  . Intermittent palpitations    in early am    Past Surgical History:  Procedure Laterality Date  . ABDOMINAL HYSTERECTOMY    . CATARACT EXTRACTION W/PHACO Left 11/26/2014   Procedure: CATARACT EXTRACTION PHACO AND INTRAOCULAR LENS PLACEMENT (IOC);  Surgeon: Estill Cotta, MD;  Location: [facility] ORS;  Service: Ophthalmology;  Laterality: Left;  US:01:24 AP:26.6 CDE:40.16 PACk [id number]  . CHOLECYSTECTOMY    . EYE SURGERY    . MASTECTOMY     bilateral     Family History No bleeding disorders, clotting disorders, autoimmune diseases, aneurysms, or porphyria   Social History   Tobacco Use  . Smoking status: Never Smoker  . Smokeless tobacco: Never Used  Substance Use Topics  . Alcohol use: No  . Drug use: Never     Allergies  Allergen Reactions  . Penicillins Rash    Current Outpatient Medications  Medication Sig Dispense Refill  . acetaminophen (TYLENOL) 500 MG tablet Take 500 mg by mouth every 6 (six) hours as needed.     Marland Kitchen alendronate (FOSAMAX) 70 MG tablet TAKE  1 TABLET BY MOUTH ONCE A WEEK WITH A FULL GLASS OF WATER ON AN EMPTY STOMACH 12 tablet 0  . Calcium Carb-Cholecalciferol (CALCIUM 600+D) 600-800 MG-UNIT TABS Take 1 tablet by mouth daily.    . Cholecalciferol (VITAMIN D3) 50 MCG (2000 UT) TABS Take 1 tablet by mouth daily.    Marland Kitchen lisinopril (PRINIVIL,ZESTRIL) 10 MG tablet Take 5 mg by mouth daily.     Marland Kitchen lovastatin (MEVACOR) 10 MG tablet Take 10 mg by mouth at bedtime.     . metoprolol succinate (TOPROL-XL) 25 MG 24 hr tablet Take 25 mg by mouth daily.    . vitamin B-12 (CYANOCOBALAMIN) 1000 MCG tablet Take 1,000 mcg by mouth daily.     No current facility-administered medications for this visit.      REVIEW OF SYSTEMS (Negative unless checked)  Constitutional: [] Weight loss  [] Fever  [] Chills Cardiac: [] Chest pain   [] Chest pressure   [x] Palpitations   [] Shortness of breath when laying flat   [] Shortness of breath at rest   [] Shortness of breath with exertion. Vascular:  [x] Pain in legs with walking   [x] Pain in legs at rest   [] Pain in legs when laying flat   [] Claudication   [] Pain in feet when walking  [] Pain in feet at rest  [] Pain in feet when  laying flat   [] History of DVT   [] Phlebitis   [x] Swelling in legs   [] Varicose veins   [] Non-healing ulcers Pulmonary:   [] Uses home oxygen   [] Productive cough   [] Hemoptysis   [] Wheeze  [] COPD   [] Asthma Neurologic:  [] Dizziness  [] Blackouts   [] Seizures   [] History of stroke   [] History of TIA  [] Aphasia   [] Temporary blindness   [] Dysphagia   [] Weakness or numbness in arms   [] Weakness or numbness in legs Musculoskeletal:  [x] Arthritis   [] Joint swelling   [] Joint pain   [] Low back pain Hematologic:  [] Easy bruising  [] Easy bleeding   [] Hypercoagulable state   [] Anemic  [] Hepatitis Gastrointestinal:  [] Blood in stool   [] Vomiting blood  [] Gastroesophageal reflux/heartburn   [] Abdominal pain Genitourinary:  [] Chronic kidney disease   [] Difficult urination  [] Frequent urination  [] Burning with  urination   [] Hematuria Skin:  [] Rashes   [] Ulcers   [] Wounds Psychological:  [] History of anxiety   []  History of major depression.    Physical Exam BP 140/61 (BP Location: Left Arm)   Pulse 65   Resp 16   Ht 5\' 2"  (1.575 m)   Wt 190 lb 12.8 oz (86.5 kg)   BMI 34.90 kg/m  Gen:  WD/WN, NAD.  Appears younger than stated age Head: [place]/AT, No temporalis wasting. Ear/Nose/Throat: Hearing grossly intact, nares w/o erythema or drainage, oropharynx w/o Erythema/Exudate Eyes: Conjunctiva clear, sclera non-icteric  Neck: trachea midline.  No JVD.  Pulmonary:  Good air movement, respirations not labored, no use of accessory muscles  Cardiac: RRR, no JVD Vascular:  Vessel Right Left  Radial Palpable Palpable                          DP  1+  2+  PT  1+  1+   Gastrointestinal:. No masses, surgical incisions, or scars. Musculoskeletal: M/S 5/5 throughout.  Extremities without ischemic changes.  No deformity or atrophy.  Mild bilateral lower extremity edema. Neurologic: Sensation grossly intact in extremities.  Symmetrical.  Speech is fluent. Motor exam as listed above. Psychiatric: Judgment intact, Mood & affect appropriate for pt's clinical situation. Dermatologic: No rashes or ulcers noted.  No cellulitis or open wounds.    Radiology No results found.  Labs No results found for this or any previous visit (from the past 2160 hour(s)).  Assessment/Plan:  Essential (primary) hypertension blood pressure control important in reducing the progression of atherosclerotic disease. On appropriate oral medications.   Hypercholesterolemia lipid control important in reducing the progression of atherosclerotic disease. Continue statin therapy   Pain in limb  Recommend:  The patient has atypical pain symptoms for pure atherosclerotic disease. 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  Noninvasive studies including ABI's and venous ultrasound of the legs will be obtained and the patient will follow up with me to review these studies.  I suspect the patient is c/o pseudoclaudication.  Patient should have an evaluation of his LS spine which I defer to the primary service.  The patient should continue walking and begin a more formal exercise program. The patient should continue his antiplatelet therapy and aggressive treatment of the lipid abnormalities.  The patient should begin wearing graduated compression socks 15-20 mmHg strength to control edema.       Leotis Pain 10/10/2019, 10:49 AM   This  note was created with Dragon medical transcription system.  Any errors from dictation are unintentional.

## 2019-10-21 ENCOUNTER — Other Ambulatory Visit: Payer: Self-pay | Admitting: Hematology and Oncology

## 2019-10-21 DIAGNOSIS — M818 Other osteoporosis without current pathological fracture: Secondary | ICD-10-CM

## 2019-10-24 ENCOUNTER — Other Ambulatory Visit (INDEPENDENT_AMBULATORY_CARE_PROVIDER_SITE_OTHER): Payer: Self-pay | Admitting: Vascular Surgery

## 2019-10-24 DIAGNOSIS — M79604 Pain in right leg: Secondary | ICD-10-CM

## 2019-10-24 DIAGNOSIS — M79669 Pain in unspecified lower leg: Secondary | ICD-10-CM

## 2019-10-25 ENCOUNTER — Ambulatory Visit (INDEPENDENT_AMBULATORY_CARE_PROVIDER_SITE_OTHER): Payer: Medicare Other

## 2019-10-25 ENCOUNTER — Encounter (INDEPENDENT_AMBULATORY_CARE_PROVIDER_SITE_OTHER): Payer: Self-pay | Admitting: Nurse Practitioner

## 2019-10-25 ENCOUNTER — Other Ambulatory Visit: Payer: Self-pay

## 2019-10-25 ENCOUNTER — Ambulatory Visit (INDEPENDENT_AMBULATORY_CARE_PROVIDER_SITE_OTHER): Payer: Medicare Other | Admitting: Nurse Practitioner

## 2019-10-25 VITALS — BP 132/78 | HR 78 | Resp 16 | Wt 186.0 lb

## 2019-10-25 DIAGNOSIS — M79604 Pain in right leg: Secondary | ICD-10-CM

## 2019-10-25 DIAGNOSIS — M79669 Pain in unspecified lower leg: Secondary | ICD-10-CM

## 2019-10-25 DIAGNOSIS — I10 Essential (primary) hypertension: Secondary | ICD-10-CM

## 2019-10-25 DIAGNOSIS — M7989 Other specified soft tissue disorders: Secondary | ICD-10-CM

## 2019-10-25 DIAGNOSIS — M79605 Pain in left leg: Secondary | ICD-10-CM

## 2019-10-25 DIAGNOSIS — E7800 Pure hypercholesterolemia, unspecified: Secondary | ICD-10-CM

## 2019-10-30 ENCOUNTER — Encounter (INDEPENDENT_AMBULATORY_CARE_PROVIDER_SITE_OTHER): Payer: Self-pay | Admitting: Nurse Practitioner

## 2019-10-30 NOTE — Progress Notes (Signed)
Subjective:    Patient ID: Kristin Mitchell, female    DOB: Feb 18, 1941, 79 y.o.   MRN: [id number] Chief Complaint  Patient presents with  . Follow-up    ultrasound follow up    Kristin Mitchell is a 79 y.o. female.  The patient presents today for noninvasive studies related to lower extremity leg pain.  The patient notes that she has always had large legs but has noted that they have recently become painful.  She describes as a tingling sensation that goes down her legs.  She denies any significant swelling.  She notes that this happens every other night or so it has been happening for "a good well".  She does note that movement helps somewhat with this painful sensation.  She denies any rest pain like symptoms.  She denies any previous history of DVT or superficial thrombophlebitis..  The chest pain or shortness of breath.  She denies any TIA or amaurosis fugax-like symptoms.  Today noninvasive studies show a right ABI 1.31 with a left of 1.14.  The patient has triphasic tibial artery waveforms with normal toe waveforms bilaterally.  The patient also underwent a bilateral lower extremity venous reflux study.  There is no evidence of DVT or superficial venous stenosis bilaterally.  No evidence of deep venous insufficiency seen bilaterally or superficial venous reflux seen in the bilateral great or short saphenous veins.   Review of Systems  Cardiovascular: Positive for leg swelling.  Musculoskeletal: Positive for myalgias.  Neurological: Positive for weakness.  All other systems reviewed and are negative.      Objective:   Physical Exam Vitals reviewed.  HENT:     Head: Normocephalic.  Cardiovascular:     Rate and Rhythm: Normal rate and regular rhythm.     Pulses: Normal pulses.     Heart sounds: Normal heart sounds.  Pulmonary:     Effort: Pulmonary effort is normal.     Breath sounds: Normal breath sounds.  Musculoskeletal:     Right lower leg: Edema present.      Left lower leg: Edema present.  Neurological:     Mental Status: She is alert and oriented to person, place, and time.     Gait: Gait abnormal.  Psychiatric:        Mood and Affect: Mood normal.        Behavior: Behavior normal.        Thought Content: Thought content normal.        Judgment: Judgment normal.     BP 132/78 (BP Location: Right Arm)   Pulse 78   Resp 16   Wt 186 lb (84.4 kg)   BMI 34.02 kg/m   Past Medical History:  Diagnosis Date  . Arthritis   . Breast cancer ([place]) 11/26/2014  . Cancer [facility])    breast  . Intermittent palpitations    in early am    Social History   Socioeconomic History  . Marital status: Married    Spouse name: Not on file  . Number of children: Not on file  . Years of education: Not on file  . Highest education level: Not on file  Occupational History  . Not on file  Tobacco Use  . Smoking status: Never Smoker  . Smokeless tobacco: Never Used  Substance and Sexual Activity  . Alcohol use: No  . Drug use: Never  . Sexual activity: Not on file  Other Topics Concern  . Not on file  Social History  Narrative  . Not on file   Social Determinants of Health   Financial Resource Strain:   . Difficulty of Paying Living Expenses:   Food Insecurity:   . Worried About Charity fundraiser in the Last Year:   . Arboriculturist in the Last Year:   Transportation Needs:   . Film/video editor (Medical):   Marland Kitchen Lack of Transportation (Non-Medical):   Physical Activity:   . Days of Exercise per Week:   . Minutes of Exercise per Session:   Stress:   . Feeling of Stress :   Social Connections:   . Frequency of Communication with Friends and Family:   . Frequency of Social Gatherings with Friends and Family:   . Attends Religious Services:   . Active Member of Clubs or Organizations:   . Attends Archivist Meetings:   Marland Kitchen Marital Status:   Intimate Partner Violence:   . Fear of Current or Ex-Partner:   . Emotionally  Abused:   Marland Kitchen Physically Abused:   . Sexually Abused:     Past Surgical History:  Procedure Laterality Date  . ABDOMINAL HYSTERECTOMY    . CATARACT EXTRACTION W/PHACO Left 11/26/2014   Procedure: CATARACT EXTRACTION PHACO AND INTRAOCULAR LENS PLACEMENT (IOC);  Surgeon: Estill Cotta, MD;  Location: [facility] ORS;  Service: Ophthalmology;  Laterality: Left;  US:01:24 AP:26.6 CDE:40.16 PACk [id number] H  . CHOLECYSTECTOMY    . EYE SURGERY    . MASTECTOMY     bilateral    Family History  Problem Relation Age of Onset  . Parkinson's disease Mother   . Hodgkin's lymphoma Father     Allergies  Allergen Reactions  . Penicillins Rash       Assessment & Plan:   1. Pain in both lower extremities Recommend:  I do not find evidence of Vascular pathology that would explain the patient's symptoms  The patient has atypical pain symptoms for vascular disease  I do not find evidence of Vascular pathology that would explain the patient's symptoms and I suspect the patient is c/o pseudoclaudication.  Patient should have an evaluation of his LS spine which I defer to the primary service.  Noninvasive studies including venous ultrasound of the legs do not identify vascular problems  The patient should continue walking and begin a more formal exercise program. The patient should continue his antiplatelet therapy and aggressive treatment of the lipid abnormalities. The patient should begin wearing graduated compression socks 15-20 mmHg strength to control her mild edema.  Patient will follow-up with me on a PRN basis  Further work-up of her lower extremity pain is deferred to the primary service     2. Essential (primary) hypertension Continue antihypertensive medications as already ordered, these medications have been reviewed and there are no changes at this time.   3. Hypercholesterolemia Continue statin as ordered and reviewed, no changes at this time    Current Outpatient  Medications on File Prior to Visit  Medication Sig Dispense Refill  . acetaminophen (TYLENOL) 500 MG tablet Take 500 mg by mouth every 6 (six) hours as needed.     Marland Kitchen alendronate (FOSAMAX) 70 MG tablet TAKE 1 TABLET BY MOUTH ONCE A WEEK WITH  A  FULL  GLASS  OF  WATER  ON  AN  EMPTY  STOMACH 12 tablet 0  . Calcium Carb-Cholecalciferol (CALCIUM 600+D) 600-800 MG-UNIT TABS Take 1 tablet by mouth daily.    . Cholecalciferol (VITAMIN D3) 50 MCG (2000 UT) TABS  Take 1 tablet by mouth daily.    Marland Kitchen lisinopril (PRINIVIL,ZESTRIL) 10 MG tablet Take 5 mg by mouth daily.     . metoprolol succinate (TOPROL-XL) 25 MG 24 hr tablet Take 25 mg by mouth daily.    . vitamin B-12 (CYANOCOBALAMIN) 1000 MCG tablet Take 1,000 mcg by mouth daily.    Marland Kitchen lovastatin (MEVACOR) 10 MG tablet Take 10 mg by mouth at bedtime.      No current facility-administered medications on file prior to visit.    There are no Patient Instructions on file for this visit. No follow-ups on file.   Kris Hartmann, NP

## 2019-11-23 ENCOUNTER — Ambulatory Visit: Payer: Medicare Other | Admitting: Nurse Practitioner

## 2019-11-23 ENCOUNTER — Other Ambulatory Visit: Payer: Medicare Other

## 2019-12-05 ENCOUNTER — Encounter: Payer: Self-pay | Admitting: Hematology and Oncology

## 2019-12-05 ENCOUNTER — Other Ambulatory Visit: Payer: Self-pay

## 2019-12-05 NOTE — Progress Notes (Signed)
No new changes noted today. The patient name and DOB has been verified by phone today. 

## 2019-12-06 NOTE — Progress Notes (Signed)
[facility]  [address], [address] [place], [place] [address] Phone: [phone]  Fax: [phone]   Clinic Day:  12/07/2019  Referring physician: Toni Arthurs, NP  Chief Complaint: Kristin Mitchell is a 79 y.o. female with stage IIB right breast cancer who is seen for 1 year assessment.  HPI:  The patient was last sen in the oncology clinic on 11/25/2018 for new patient assessment.  She had received neoadjuvant chemotherapy followed by surgery and 5 years of Femara (no records were available).  Symptomatically, she was doing fine.  She had some right arm pain since her fall in 06/2018.  Exam revealed no evidence of recurrent disease.    Labs at last visit revealed hematocrit 33.8, hemoglobin 11.4, MCV 92.6, platelets 123,000, WBC 6,600. Sodium was 130. Creatinine was 1.09 (CrCl 49 ml/min). Ferritin was 203 with an iron saturation of 23% and a TIBC of 340. B12 was 536 and folate 12.0.  Reticulocyte count was 1.3%. CA27.29 was 25.8.  Bone density on 12/13/2018 revealed osteopenia with a T-score of -2.2 at the lumbar spine.  During the interim, she has been "ok".  She gets fatigued if she walks across her house.  She reports that she has a new pain down her right arm. She performs monthly breast self-exams. Her diet is the same.   Past Medical History:  Diagnosis Date  . Arthritis   . Breast cancer ([place]) 11/26/2014  . Cancer [facility])    breast  . Intermittent palpitations    in early am    Past Surgical History:  Procedure Laterality Date  . ABDOMINAL HYSTERECTOMY    . CATARACT EXTRACTION W/PHACO Left 11/26/2014   Procedure: CATARACT EXTRACTION PHACO AND INTRAOCULAR LENS PLACEMENT (IOC);  Surgeon: Estill Cotta, MD;  Location: [facility] ORS;  Service: Ophthalmology;  Laterality: Left;  US:01:24 AP:26.6 CDE:40.16 PACk [id number] H  . CHOLECYSTECTOMY    . EYE SURGERY    . MASTECTOMY     bilateral    Family History  Problem Relation Age of Onset   . Parkinson's disease Mother   . Hodgkin's lymphoma Father     Social History:  reports that she has never smoked. She has never used smokeless tobacco. She reports that she does not drink alcohol and does not use drugs. She does not drink or smoke. She denies any known exposure to radiation or toxins. 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 She lives in [place]. The patient is alone today.  Allergies:  Allergies  Allergen Reactions  . Penicillins Rash    Current Medications: Current Outpatient Medications  Medication Sig Dispense Refill  . acetaminophen (TYLENOL) 500 MG tablet Take 500 mg by mouth every 6 (six) hours as needed.     Marland Kitchen alendronate (FOSAMAX) 70 MG tablet TAKE 1 TABLET BY MOUTH ONCE A WEEK WITH  A  FULL  GLASS  OF  WATER  ON  AN  EMPTY  STOMACH 12 tablet 0  . Calcium Carb-Cholecalciferol (CALCIUM 600+D) 600-800 MG-UNIT TABS Take 1 tablet by mouth daily.    . Cholecalciferol (VITAMIN D3) 50 MCG (2000 UT) TABS Take 1 tablet by mouth daily.    Marland Kitchen lisinopril (PRINIVIL,ZESTRIL) 10 MG tablet Take 5 mg by mouth daily.     Marland Kitchen lovastatin (MEVACOR) 10 MG tablet Take 10 mg by mouth at bedtime.     . metoprolol succinate (TOPROL-XL) 25 MG 24 hr tablet Take 25 mg by mouth daily.    Marland Kitchen  vitamin B-12 (CYANOCOBALAMIN) 1000 MCG tablet Take 1,000 mcg by mouth daily.     No current facility-administered medications for this visit.    Review of Systems  Constitutional: Positive for malaise/fatigue. Negative for chills, diaphoresis, fever and weight loss (up 6 lbs).       Doing fine.  HENT: Negative.  Negative for congestion, ear discharge, ear pain, hearing loss, nosebleeds, sinus pain, sore throat and tinnitus.   Eyes: Negative.  Negative for blurred vision.  Respiratory: Negative.  Negative for cough, hemoptysis, sputum production, shortness of breath and wheezing.   Cardiovascular: Negative.  Negative for chest pain, palpitations, orthopnea, leg swelling and PND.    Gastrointestinal: Negative.  Negative for abdominal pain, blood in stool, constipation, diarrhea, heartburn, melena, nausea and vomiting.       Diet is stable.  Genitourinary: Negative.  Negative for dysuria, frequency, hematuria and urgency.  Musculoskeletal: Negative.  Negative for back pain, joint pain, myalgias and neck pain.  Skin: Negative.  Negative for itching and rash.  Neurological: Negative for dizziness, tingling, sensory change, weakness and headaches.       Right arm numbness at times.  Endo/Heme/Allergies: Negative.  Does not bruise/bleed easily.  Psychiatric/Behavioral: Negative.  Negative for depression, memory loss and substance abuse. The patient is not nervous/anxious and does not have insomnia.   All other systems reviewed and are negative.  Performance status (ECOG): 1  Vitals Blood pressure (!) 156/56, pulse 59, temperature (!) 96.9 F (36.1 C), temperature source Tympanic, resp. rate 18, weight 184 lb 15.5 oz (83.9 kg), SpO2 100 %.   Physical Exam Vitals and nursing note reviewed.  Constitutional:      General: She is not in acute distress.    Appearance: She is well-developed. She is not diaphoretic.     Comments: She has a cane by her side.  HENT:     Head: Normocephalic and atraumatic.     Comments: Short styled gray hair.    Mouth/Throat:     Pharynx: No oropharyngeal exudate.  Eyes:     General: No scleral icterus.    Conjunctiva/sclera: Conjunctivae normal.     Pupils: Pupils are equal, round, and reactive to light.     Comments: Blue eyes.   Neck:     Vascular: No JVD.  Cardiovascular:     Rate and Rhythm: Normal rate and regular rhythm.     Heart sounds: Normal heart sounds. No murmur heard.   Pulmonary:     Effort: Pulmonary effort is normal. No respiratory distress.     Breath sounds: Normal breath sounds. No wheezing or rales.  Chest:     Breasts:        Right: Absent. No mass, skin change or tenderness.        Left: Absent. No mass,  skin change or tenderness.     Comments: No erythema or nodularity. Abdominal:     General: Bowel sounds are normal. There is no distension.     Palpations: Abdomen is soft. There is no mass.     Tenderness: There is no abdominal tenderness. There is no guarding or rebound.  Musculoskeletal:        General: Normal range of motion.     Right upper arm: No tenderness or bony tenderness.     Cervical back: Normal range of motion and neck supple.  Lymphadenopathy:     Head:     Right side of head: No preauricular, posterior auricular or occipital adenopathy.  Left side of head: No preauricular, posterior auricular or occipital adenopathy.     Cervical: No cervical adenopathy.     Upper Body:     Right upper body: No supraclavicular adenopathy.     Left upper body: No supraclavicular adenopathy.     Lower Body: No right inguinal adenopathy. No left inguinal adenopathy.  Skin:    General: Skin is warm and dry.     Findings: No erythema.  Neurological:     Mental Status: She is alert and oriented to person, place, and time.  Psychiatric:        Behavior: Behavior normal.        Thought Content: Thought content normal.        Judgment: Judgment normal.    Appointment on 12/07/2019  Component Date Value Ref Range Status  . Sodium 12/07/2019 128* 135 - 145 mmol/L Final  . Potassium 12/07/2019 4.1  3.5 - 5.1 mmol/L Final  . Chloride 12/07/2019 94* 98 - 111 mmol/L Final  . CO2 12/07/2019 26  22 - 32 mmol/L Final  . Glucose, Bld 12/07/2019 98  70 - 99 mg/dL Final   Glucose reference range applies only to samples taken after fasting for at least 8 hours.  . BUN 12/07/2019 19  8 - 23 mg/dL Final  . Creatinine, Ser 12/07/2019 1.06* 0.44 - 1.00 mg/dL Final  . Calcium 12/07/2019 9.3  8.9 - 10.3 mg/dL Final  . Total Protein 12/07/2019 7.0  6.5 - 8.1 g/dL Final  . Albumin 12/07/2019 3.8  3.5 - 5.0 g/dL Final  . AST 12/07/2019 19  15 - 41 U/L Final  . ALT 12/07/2019 11  0 - 44 U/L Final  .  Alkaline Phosphatase 12/07/2019 76  38 - 126 U/L Final  . Total Bilirubin 12/07/2019 0.9  0.3 - 1.2 mg/dL Final  . GFR calc non Af Amer 12/07/2019 50* >60 mL/min Final  . GFR calc Af Amer 12/07/2019 58* >60 mL/min Final  . Anion gap 12/07/2019 8  5 - 15 Final   Performed at [facility], [address], [place], [place] [address]  . WBC 12/07/2019 6.5  4.0 - 10.5 K/uL Final  . RBC 12/07/2019 3.87  3.87 - 5.11 MIL/uL Final  . Hemoglobin 12/07/2019 12.1  12.0 - 15.0 g/dL Final  . HCT 12/07/2019 35.7* 36 - 46 % Final  . MCV 12/07/2019 92.2  80.0 - 100.0 fL Final  . MCH 12/07/2019 31.3  26.0 - 34.0 pg Final  . MCHC 12/07/2019 33.9  30.0 - 36.0 g/dL Final  . RDW 12/07/2019 12.2  11.5 - 15.5 % Final  . Platelets 12/07/2019 155  150 - 400 K/uL Final  . nRBC 12/07/2019 0.0  0.0 - 0.2 % Final  . Neutrophils Relative % 12/07/2019 68  % Final  . Neutro Abs 12/07/2019 4.4  1.7 - 7.7 K/uL Final  . Lymphocytes Relative 12/07/2019 18  % Final  . Lymphs Abs 12/07/2019 1.2  0.7 - 4.0 K/uL Final  . Monocytes Relative 12/07/2019 9  % Final  . Monocytes Absolute 12/07/2019 0.6  0 - 1 K/uL Final  . Eosinophils Relative 12/07/2019 3  % Final  . Eosinophils Absolute 12/07/2019 0.2  0 - 0 K/uL Final  . Basophils Relative 12/07/2019 1  % Final  . Basophils Absolute 12/07/2019 0.0  0 - 0 K/uL Final  . Immature Granulocytes 12/07/2019 1  % Final  . Abs Immature Granulocytes 12/07/2019 0.04  0.00 - 0.07  K/uL Final   Performed at [facility], [address] [place], [place] [address]    Assessment:  Kristin Mitchell is a 79 y.o. female with stage IIB right breast cancer s/p neoadjuvant chemotherapy and bilateral mastectomy with right axillary lymph node dissection. 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   Diagnostic mammogram revealed a mass in the upper outer right breast measuring 2.0  x 0.8 x 0.9cm with an adjacent smaller lesion measuring 1.0 x 0.8 x 0.8cm and two enlarged axillary lymph nodes. Breast MRI on 01/01/2011 showed two areas of enhancement in the right breast 1cm apart, measuring 2.7cm and 2.6cm, with a 2.6cm area of enhancement and significant satellite lesions spanning total 6cm.   PET scan on 12/25/2010 revealed the known right breast mass and enlarged axillary lymph nodes. There was no evidence of abnormal uptake elsewhere in the chest, neck, abdomen, or pelvis.  She was felt to have clinical T2 (multifocal) N1 right breast cancer.  She received neoadjuvant chemotherapy (unknown).  She underwent bilateral mastectomy with right axillary lymph node dissection on 07/22/2011.  Pathology confirmed no residual carcinoma and 3 negative right axillary lymph nodes.   She received Femara x 5 years  (completed 08/2016).   CA27.29 has been followed: 29.6 on 11/23/2012, 27.8 on 11/23/2014, 28.5 on 05/24/2015, 24.7 on 11/22/2015, 27.4 on 05/22/2016, 25.6 on 11/20/2017, 26.9 on 11/26/2017, 25.8 on 11/25/2018, and 27.3 on 12/07/2019.  Bone density on 01/05/2012 showed osteopenia with a T-score of -2.3 in the AP spine L1-L4.  Bone density on 01/11/2013 showed osteopenia with a T-score of -2.3 in the AP spine L1-L4.  Bone density on 01/12/2014 showed osteopenia with a T-score of -2.4.   Bone density on 05/30/2015 showed osteoporosis with a T-score of -2.5 in the AP spine L1-L4.   Bone density on 05/31/2017 showed osteoporosis with a T-score of -2.5 in the AP spine L1-L4.  Bone density on 12/13/2018 revealed osteopenia with a T-score of -2.2 at the lumbar spine.  The patient received both doses of the COVID-19 vaccine in the beginning of the year.  Symptomatically, she is doing well.  She is easily fatigued.  She sometimes has right arm numbness.  Exam is stable.  Plan: 1.   Labs today:  CBC with diff, CMP, CA27.29. 2.   Stage IIB right breast cancer  She received neoadjuvant  chemotherapy followed by surgery and 5 years of Femara.  Re-request old records..  She denies any symptoms of recurrent disease.  Exam is unremarkable.    CA 27-29 is normal.    Continue annual surveillance. 3.   Mild normocytic anemia, resolved  Hematocrit 33.8.  Hemoglobin 11.4.  MCV 92.6 on 11/25/2018.    Hematocrit 35.7.  Hemoglobin 12.1.  MCV 92.2 on 12/07/2019.    Patient denies any bleeding.  Diet is fair.  She has had negative Cologuard testing in the past x1.  Ferritin, B12, and folate normal in 11/25/2018. 4.   Hyponatremia  Sodium 128.  Encourage patient to follow-up with Toni Arthurs, NP.  Labs sent to PCP. 5.   Osteopenia  Bone density on 12/13/2018 revealed osteopenia..  T score improved from -2.5 to -2.2. 6.   RTC in 1 year for MD assessment and labs (CBC with diff, CMP, CA 27.29).  I discussed the assessment and treatment plan with the patient.  The patient was provided an opportunity to ask questions and  all were answered.  The patient agreed with the plan and demonstrated an understanding of the instructions.  The patient was advised to call back if the symptoms worsen or if the condition fails to improve as anticipated.   Lequita Asal, MD, PhD    12/07/2019, 3:23 PM  I, Mirian Mo Tufford, am acting as Education administrator for [employer]. Mike Gip, MD, PhD.  I, Nikkol Pai C. Mike Gip, MD, have reviewed the above documentation for accuracy and completeness, and I agree with the above.

## 2019-12-07 ENCOUNTER — Inpatient Hospital Stay (HOSPITAL_BASED_OUTPATIENT_CLINIC_OR_DEPARTMENT_OTHER): Payer: Medicare Other | Admitting: Hematology and Oncology

## 2019-12-07 ENCOUNTER — Encounter: Payer: Self-pay | Admitting: Hematology and Oncology

## 2019-12-07 ENCOUNTER — Other Ambulatory Visit: Payer: Self-pay

## 2019-12-07 ENCOUNTER — Inpatient Hospital Stay: Payer: Medicare Other | Attending: Hematology and Oncology

## 2019-12-07 ENCOUNTER — Telehealth: Payer: Self-pay

## 2019-12-07 VITALS — BP 156/56 | HR 59 | Temp 96.9°F | Resp 18 | Wt 185.0 lb

## 2019-12-07 DIAGNOSIS — E871 Hypo-osmolality and hyponatremia: Secondary | ICD-10-CM

## 2019-12-07 DIAGNOSIS — Z170 Estrogen receptor positive status [ER+]: Secondary | ICD-10-CM

## 2019-12-07 DIAGNOSIS — D649 Anemia, unspecified: Secondary | ICD-10-CM | POA: Diagnosis not present

## 2019-12-07 DIAGNOSIS — C50411 Malignant neoplasm of upper-outer quadrant of right female breast: Secondary | ICD-10-CM | POA: Diagnosis not present

## 2019-12-07 DIAGNOSIS — Z853 Personal history of malignant neoplasm of breast: Secondary | ICD-10-CM | POA: Diagnosis present

## 2019-12-07 DIAGNOSIS — M810 Age-related osteoporosis without current pathological fracture: Secondary | ICD-10-CM | POA: Diagnosis not present

## 2019-12-07 DIAGNOSIS — R5383 Other fatigue: Secondary | ICD-10-CM | POA: Diagnosis not present

## 2019-12-07 DIAGNOSIS — Z9221 Personal history of antineoplastic chemotherapy: Secondary | ICD-10-CM | POA: Diagnosis not present

## 2019-12-07 DIAGNOSIS — Z79899 Other long term (current) drug therapy: Secondary | ICD-10-CM | POA: Diagnosis not present

## 2019-12-07 DIAGNOSIS — M1990 Unspecified osteoarthritis, unspecified site: Secondary | ICD-10-CM | POA: Diagnosis not present

## 2019-12-07 DIAGNOSIS — R5381 Other malaise: Secondary | ICD-10-CM | POA: Diagnosis not present

## 2019-12-07 DIAGNOSIS — M818 Other osteoporosis without current pathological fracture: Secondary | ICD-10-CM

## 2019-12-07 DIAGNOSIS — Z9223 Personal history of estrogen therapy: Secondary | ICD-10-CM | POA: Diagnosis not present

## 2019-12-07 DIAGNOSIS — Z9013 Acquired absence of bilateral breasts and nipples: Secondary | ICD-10-CM | POA: Diagnosis not present

## 2019-12-07 DIAGNOSIS — M8580 Other specified disorders of bone density and structure, unspecified site: Secondary | ICD-10-CM | POA: Insufficient documentation

## 2019-12-07 LAB — CBC WITH DIFFERENTIAL/PLATELET
Abs Immature Granulocytes: 0.04 10*3/uL (ref 0.00–0.07)
Basophils Absolute: 0 10*3/uL (ref 0.0–0.1)
Basophils Relative: 1 %
Eosinophils Absolute: 0.2 10*3/uL (ref 0.0–0.5)
Eosinophils Relative: 3 %
HCT: 35.7 % — ABNORMAL LOW (ref 36.0–46.0)
Hemoglobin: 12.1 g/dL (ref 12.0–15.0)
Immature Granulocytes: 1 %
Lymphocytes Relative: 18 %
Lymphs Abs: 1.2 10*3/uL (ref 0.7–4.0)
MCH: 31.3 pg (ref 26.0–34.0)
MCHC: 33.9 g/dL (ref 30.0–36.0)
MCV: 92.2 fL (ref 80.0–100.0)
Monocytes Absolute: 0.6 10*3/uL (ref 0.1–1.0)
Monocytes Relative: 9 %
Neutro Abs: 4.4 10*3/uL (ref 1.7–7.7)
Neutrophils Relative %: 68 %
Platelets: 155 10*3/uL (ref 150–400)
RBC: 3.87 MIL/uL (ref 3.87–5.11)
RDW: 12.2 % (ref 11.5–15.5)
WBC: 6.5 10*3/uL (ref 4.0–10.5)
nRBC: 0 % (ref 0.0–0.2)

## 2019-12-07 LAB — COMPREHENSIVE METABOLIC PANEL
ALT: 11 U/L (ref 0–44)
AST: 19 U/L (ref 15–41)
Albumin: 3.8 g/dL (ref 3.5–5.0)
Alkaline Phosphatase: 76 U/L (ref 38–126)
Anion gap: 8 (ref 5–15)
BUN: 19 mg/dL (ref 8–23)
CO2: 26 mmol/L (ref 22–32)
Calcium: 9.3 mg/dL (ref 8.9–10.3)
Chloride: 94 mmol/L — ABNORMAL LOW (ref 98–111)
Creatinine, Ser: 1.06 mg/dL — ABNORMAL HIGH (ref 0.44–1.00)
GFR calc Af Amer: 58 mL/min — ABNORMAL LOW (ref >60)
GFR calc non Af Amer: 50 mL/min — ABNORMAL LOW (ref >60)
Glucose, Bld: 98 mg/dL (ref 70–99)
Potassium: 4.1 mmol/L (ref 3.5–5.1)
Sodium: 128 mmol/L — ABNORMAL LOW (ref 135–145)
Total Bilirubin: 0.9 mg/dL (ref 0.3–1.2)
Total Protein: 7 g/dL (ref 6.5–8.1)

## 2019-12-07 NOTE — Telephone Encounter (Signed)
The patient labs has been routed to her PCP to reveiw for hyponatremia. The patient has been informed.

## 2019-12-07 NOTE — Telephone Encounter (Signed)
I have reached out to the patient PCP office to inform them of her low sodium. They have informed me that will call the patient and get this address today. I have informed the patient, she was understanding and agreeable.

## 2019-12-08 LAB — CANCER ANTIGEN 27.29: CA 27.29: 27.3 U/mL (ref 0.0–38.6)

## 2019-12-19 DIAGNOSIS — M160 Bilateral primary osteoarthritis of hip: Secondary | ICD-10-CM | POA: Insufficient documentation

## 2019-12-20 ENCOUNTER — Other Ambulatory Visit: Payer: Self-pay | Admitting: Orthopedic Surgery

## 2019-12-20 DIAGNOSIS — M4807 Spinal stenosis, lumbosacral region: Secondary | ICD-10-CM

## 2019-12-20 DIAGNOSIS — M48062 Spinal stenosis, lumbar region with neurogenic claudication: Secondary | ICD-10-CM

## 2020-01-09 ENCOUNTER — Ambulatory Visit
Admission: RE | Admit: 2020-01-09 | Discharge: 2020-01-09 | Disposition: A | Discharge status: Home / Self Care | Payer: Medicare Other | Source: Ambulatory Visit | Attending: Orthopedic Surgery | Admitting: Orthopedic Surgery

## 2020-01-09 ENCOUNTER — Other Ambulatory Visit: Payer: Self-pay

## 2020-01-09 DIAGNOSIS — M4807 Spinal stenosis, lumbosacral region: Secondary | ICD-10-CM | POA: Diagnosis present

## 2020-01-09 DIAGNOSIS — M48062 Spinal stenosis, lumbar region with neurogenic claudication: Secondary | ICD-10-CM | POA: Insufficient documentation

## 2020-01-11 DIAGNOSIS — G8929 Other chronic pain: Secondary | ICD-10-CM | POA: Insufficient documentation

## 2020-01-11 DIAGNOSIS — M48061 Spinal stenosis, lumbar region without neurogenic claudication: Secondary | ICD-10-CM | POA: Insufficient documentation

## 2020-01-16 ENCOUNTER — Other Ambulatory Visit: Payer: Self-pay | Admitting: Gerontology

## 2020-01-16 DIAGNOSIS — N2889 Other specified disorders of kidney and ureter: Secondary | ICD-10-CM

## 2020-02-02 ENCOUNTER — Other Ambulatory Visit: Payer: Self-pay

## 2020-02-02 ENCOUNTER — Ambulatory Visit
Admission: RE | Admit: 2020-02-02 | Discharge: 2020-02-02 | Disposition: A | Discharge status: Home / Self Care | Payer: Medicare Other | Source: Ambulatory Visit | Attending: Gerontology | Admitting: Gerontology

## 2020-02-02 DIAGNOSIS — N2889 Other specified disorders of kidney and ureter: Secondary | ICD-10-CM | POA: Diagnosis not present

## 2020-02-17 ENCOUNTER — Other Ambulatory Visit: Payer: Self-pay | Admitting: Hematology and Oncology

## 2020-02-17 DIAGNOSIS — M818 Other osteoporosis without current pathological fracture: Secondary | ICD-10-CM

## 2020-02-19 NOTE — Progress Notes (Signed)
[facility]  [address] [place], [place] [address] Phone: [phone]  Fax: [phone]   Clinic Day:  02/20/2020  Referring physician: Toni Arthurs, NP  Chief Complaint: Kristin Mitchell is a 79 y.o. female with stage IIB right breast cancer and a right renal mass who is seen for reassessment.  HPI:  The patient was last sen in the oncology clinic on 12/07/2019. At that time, she was doing well.  She was easily fatigued.  Intermittently she had right arm numbness.  Exam was stable. Hematocrit was 35.7, hemoglobin 12.1, platelets 155,000, WBC 6,500. Sodium was 128. Creatinine was 1.06 (CrCl 50 ml/min). CA27.29 was 27.3. She was to follow up in 1 year.  The patient was seen by Dr. Hampton Abbot PA on 12/19/2019 for bilateral leg pain. Lumbar Spine MRI without contrast on 01/09/2020 revealed lumbar spondylosis. There was also a 2.6 cm exophytic lesion arising from the right kidney demonstrating T2 intermediate to low signal and T1 intermediate signal. This could reflect a hemorrhagic exophytic right renal cyst. A right renal mass could not be excluded and renal ultrasound was recommended for initial further characterization.  Renal ultrasound on 02/02/2020 revealed a solid 2.7 cm right renal mass. Findings were compatible with renal cell carcinoma until proven otherwise. There was incidental note of a 2.9 cm left adnexal cyst. No follow-up necessary.  During the interim, she has been "fine." Her fatigue is stable. She has arm numbness every once in a while but it always goes away quickly. She has knee pain and swelling and recently took a course of prednisone. She denies shortness of breath chest pain, nausea, vomiting, diarrhea, and hematuria. Her urine is usually clear.  She is not set up to see a urologist yet. She has been taking 2 sodium tablets daily for about a month.   Past Medical History:  Diagnosis Date  . Arthritis   . Breast cancer  ([place]) 11/26/2014  . Cancer [facility])    breast  . Intermittent palpitations    in early am    Past Surgical History:  Procedure Laterality Date  . ABDOMINAL HYSTERECTOMY    . CATARACT EXTRACTION W/PHACO Left 11/26/2014   Procedure: CATARACT EXTRACTION PHACO AND INTRAOCULAR LENS PLACEMENT (IOC);  Surgeon: Estill Cotta, MD;  Location: [facility] ORS;  Service: Ophthalmology;  Laterality: Left;  US:01:24 AP:26.6 CDE:40.16 PACk [id number] H  . CHOLECYSTECTOMY    . EYE SURGERY    . MASTECTOMY     bilateral    Family History  Problem Relation Age of Onset  . Parkinson's disease Mother   . Hodgkin's lymphoma Father     Social History:  reports that she has never smoked. She has never used smokeless tobacco. She reports that she does not drink alcohol and does not use drugs. She does not drink or smoke. She denies any known exposure to radiation or toxins. 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 She lives in [place]. The patient is alone today.  Allergies:  Allergies  Allergen Reactions  . Penicillins Rash    Current Medications: Current Outpatient Medications  Medication Sig Dispense Refill  . acetaminophen (TYLENOL) 500 MG tablet Take 500 mg by mouth every 6 (six) hours as needed.     Marland Kitchen alendronate (FOSAMAX) 70 MG tablet TAKE 1 TABLET BY MOUTH ONCE A WEEK WITH  A  FULL  GLASS  OF  WATER  ON  AN  EMPTY  STOMACH 12 tablet 0  .  Calcium Carb-Cholecalciferol (CALCIUM 600+D) 600-800 MG-UNIT TABS Take 1 tablet by mouth daily.    . Cholecalciferol (VITAMIN D3) 50 MCG (2000 UT) TABS Take 1 tablet by mouth daily.    Marland Kitchen lisinopril (ZESTRIL) 2.5 MG tablet Take 2.5 mg by mouth daily.    . sodium chloride 1 g tablet Take by mouth.    . vitamin B-12 (CYANOCOBALAMIN) 1000 MCG tablet Take 1,000 mcg by mouth daily.    Marland Kitchen lisinopril (PRINIVIL,ZESTRIL) 10 MG tablet Take 5 mg by mouth daily.  (Patient not taking: Reported on 02/20/2020)    . lovastatin (MEVACOR) 10 MG tablet Take 10 mg  by mouth at bedtime.     . metoprolol succinate (TOPROL-XL) 25 MG 24 hr tablet Take 25 mg by mouth daily. (Patient not taking: Reported on 02/20/2020)     No current facility-administered medications for this visit.    Review of Systems  Constitutional: Positive for malaise/fatigue. Negative for chills, diaphoresis, fever and weight loss (up 4 lbs).       Feels "fine."  HENT: Negative.  Negative for congestion, ear discharge, ear pain, hearing loss, nosebleeds, sinus pain, sore throat and tinnitus.   Eyes: Negative.  Negative for blurred vision.  Respiratory: Negative.  Negative for cough, hemoptysis, sputum production, shortness of breath and wheezing.   Cardiovascular: Negative.  Negative for chest pain, palpitations and leg swelling.  Gastrointestinal: Negative.  Negative for abdominal pain, blood in stool, constipation, diarrhea, heartburn, melena, nausea and vomiting.  Genitourinary: Negative.  Negative for dysuria, frequency, hematuria and urgency.  Musculoskeletal: Negative.  Negative for back pain, joint pain, myalgias and neck pain.  Skin: Negative.  Negative for itching and rash.  Neurological: Negative for dizziness, tingling, sensory change, weakness and headaches.       Right arm numbness at times.  Endo/Heme/Allergies: Negative.  Does not bruise/bleed easily.  Psychiatric/Behavioral: Negative.  Negative for depression and memory loss. The patient is not nervous/anxious and does not have insomnia.   All other systems reviewed and are negative.  Performance status (ECOG): 1  Vitals Blood pressure 117/72, pulse 62, temperature 97.6 F (36.4 C), temperature source Tympanic, resp. rate 18, weight 188 lb (85.3 kg), SpO2 100 %.   Physical Exam Vitals and nursing note reviewed.  Constitutional:      General: She is not in acute distress.    Appearance: Normal appearance. She is well-developed. She is not ill-appearing or diaphoretic.     Comments: She has a cane by her side.    HENT:     Head: Normocephalic and atraumatic.     Comments: Short styled gray hair. Eyes:     General: No scleral icterus.    Extraocular Movements: Extraocular movements intact.     Conjunctiva/sclera: Conjunctivae normal.     Comments: Blue eyes.   Skin:    Coloration: Skin is not jaundiced or pale.  Neurological:     Mental Status: She is alert and oriented to person, place, and time. Mental status is at baseline.  Psychiatric:        Behavior: Behavior normal.        Thought Content: Thought content normal.        Judgment: Judgment normal.    No visits with results within 3 Day(s) from this visit.  Latest known visit with results is:  Appointment on 12/07/2019  Component Date Value Ref Range Status  . Sodium 12/07/2019 128* 135 - 145 mmol/L Final  . Potassium 12/07/2019 4.1  3.5 - 5.1  mmol/L Final  . Chloride 12/07/2019 94* 98 - 111 mmol/L Final  . CO2 12/07/2019 26  22 - 32 mmol/L Final  . Glucose, Bld 12/07/2019 98  70 - 99 mg/dL Final   Glucose reference range applies only to samples taken after fasting for at least 8 hours.  . BUN 12/07/2019 19  8 - 23 mg/dL Final  . Creatinine, Ser 12/07/2019 1.06* 0.44 - 1.00 mg/dL Final  . Calcium 12/07/2019 9.3  8.9 - 10.3 mg/dL Final  . Total Protein 12/07/2019 7.0  6.5 - 8.1 g/dL Final  . Albumin 12/07/2019 3.8  3.5 - 5.0 g/dL Final  . AST 12/07/2019 19  15 - 41 U/L Final  . ALT 12/07/2019 11  0 - 44 U/L Final  . Alkaline Phosphatase 12/07/2019 76  38 - 126 U/L Final  . Total Bilirubin 12/07/2019 0.9  0.3 - 1.2 mg/dL Final  . GFR calc non Af Amer 12/07/2019 50* >60 mL/min Final  . GFR calc Af Amer 12/07/2019 58* >60 mL/min Final  . Anion gap 12/07/2019 8  5 - 15 Final   Performed at [facility], [address] [place], [place] [address]  . CA 27.29 12/07/2019 27.3  0.0 - 38.6 U/mL Final   Comment: (NOTE) Siemens Centaur Immunochemiluminometric Methodology [facility]) Values obtained with different assay methods  or kits cannot be used interchangeably. Results cannot be interpreted as absolute evidence of the presence or absence of malignant disease. Performed At: [facility] [place], [place] [id number] Rush Farmer MD MV:[id number]   . WBC 12/07/2019 6.5  4.0 - 10.5 K/uL Final  . RBC 12/07/2019 3.87  3.87 - 5.11 MIL/uL Final  . Hemoglobin 12/07/2019 12.1  12.0 - 15.0 g/dL Final  . HCT 12/07/2019 35.7* 36 - 46 % Final  . MCV 12/07/2019 92.2  80.0 - 100.0 fL Final  . MCH 12/07/2019 31.3  26.0 - 34.0 pg Final  . MCHC 12/07/2019 33.9  30.0 - 36.0 g/dL Final  . RDW 12/07/2019 12.2  11.5 - 15.5 % Final  . Platelets 12/07/2019 155  150 - 400 K/uL Final  . nRBC 12/07/2019 0.0  0.0 - 0.2 % Final  . Neutrophils Relative % 12/07/2019 68  % Final  . Neutro Abs 12/07/2019 4.4  1.7 - 7.7 K/uL Final  . Lymphocytes Relative 12/07/2019 18  % Final  . Lymphs Abs 12/07/2019 1.2  0.7 - 4.0 K/uL Final  . Monocytes Relative 12/07/2019 9  % Final  . Monocytes Absolute 12/07/2019 0.6  0.1 - 1.0 K/uL Final  . Eosinophils Relative 12/07/2019 3  % Final  . Eosinophils Absolute 12/07/2019 0.2  0.0 - 0.5 K/uL Final  . Basophils Relative 12/07/2019 1  % Final  . Basophils Absolute 12/07/2019 0.0  0.0 - 0.1 K/uL Final  . Immature Granulocytes 12/07/2019 1  % Final  . Abs Immature Granulocytes 12/07/2019 0.04  0.00 - 0.07 K/uL Final   Performed at [facility], [address], [place], [place] [address]    Assessment:  Fabianna Keats is a 79 y.o. female with stage IIB right breast cancer s/p neoadjuvant chemotherapy and bilateral mastectomy with right axillary lymph node dissection. 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   Diagnostic mammogram revealed a mass in the upper outer right breast measuring 2.0 x 0.8 x 0.9cm with an adjacent smaller lesion measuring 1.0 x 0.8 x  0.8cm and  two enlarged axillary lymph nodes. Breast MRI on 01/01/2011 showed two areas of enhancement in the right breast 1cm apart, measuring 2.7cm and 2.6cm, with a 2.6cm area of enhancement and significant satellite lesions spanning total 6cm.   PET scan on 12/25/2010 revealed the known right breast mass and enlarged axillary lymph nodes. There was no evidence of abnormal uptake elsewhere in the chest, neck, abdomen, or pelvis.  She was felt to have clinical T2 (multifocal) N1 right breast cancer.  She received neoadjuvant chemotherapy (unknown).  She underwent bilateral mastectomy with right axillary lymph node dissection on 07/22/2011.  Pathology confirmed no residual carcinoma and 3 negative right axillary lymph nodes.   She received Femara x 5 years  (completed 08/2016).   CA27.29 has been followed: 29.6 on 11/23/2012, 27.8 on 11/23/2014, 28.5 on 05/24/2015, 24.7 on 11/22/2015, 27.4 on 05/22/2016, 25.6 on 11/20/2017, 26.9 on 11/26/2017, 25.8 on 11/25/2018, and 27.3 on 12/07/2019.  Bone density on 01/05/2012 showed osteopenia with a T-score of -2.3 in the AP spine L1-L4.  Bone density on 01/11/2013 showed osteopenia with a T-score of -2.3 in the AP spine L1-L4.  Bone density on 01/12/2014 showed osteopenia with a T-score of -2.4.   Bone density on 05/30/2015 showed osteoporosis with a T-score of -2.5 in the AP spine L1-L4.   Bone density on 05/31/2017 showed osteoporosis with a T-score of -2.5 in the AP spine L1-L4.  Bone density on 12/13/2018 revealed osteopenia with a T-score of -2.2 at the lumbar spine.  She has a right renal mass worrisome for renal cell carcinoma.  Lumbar spine MRI without contrast on 01/09/2020 revealed lumbar spondylosis. There was also a 2.6 cm exophytic lesion arising from the right kidney demonstrating T2 intermediate to low signal and T1 intermediate signal. This could reflect a hemorrhagic exophytic right renal cyst. A right renal mass could not be excluded.  Renal ultrasound  on 02/02/2020 revealed a solid 2.7 cm right renal mass. Findings were c/w renal cell carcinoma until proven otherwise. There was incidental note of a 2.9 cm left adnexal cyst.   The patient received both doses of the COVID-19 vaccine in the beginning of the year.  Symptomatically, she is doing well.  She denies any hematuria.  Plan: 1.   Labs:  CBC with diff, CMP, LDH. 2.   Urinalysis. 3.   Right renal mass  Asymptomatic right renal mass found incidentally on lumbar spine MRI for bilateral leg pain.  Renal ultrasound confirms a 2.7 cm right renal mass suggestive of renal cell carcinoma.   Lesion appears solid.  If renal cell carcinoma, clinical T1a lesion.   Obtain CXR.  Options:  partial or radical nephrectomy, ablation, or observation.   Discuss possible surveillance or ablative procedure based on age and co-morbidities.  Referral to urology. 3.   Stage IIB right breast cancer  She received neoadjuvant chemotherapy followed by surgery and 5 years of Femara.  She denies any symptoms of recurrent disease.  Exam was unremarkable on 12/07/2019.    CA27.29 was normal.    Continue annual surveillance. 3.   Mild normocytic anemia, resolved  Hematocrit 33.8.  Hemoglobin 11.4.  MCV 92.6 on 11/25/2018.    Hematocrit 35.7.  Hemoglobin 12.1.  MCV 92.2 on 12/07/2019.    Hematocrit 36.1.  Hemoglobin 11.7.  MCV 94.3 on 02/20/2020.    Patient denies any bleeding.  She has had negative Cologuard testing in the past x1.  Patient denies any hematuria. 4.   Hyponatremia, resolved  Sodium 135.  Patient taking NaCl tablets 2/day. 5.   Osteopenia  Bone density on 12/13/2018 revealed osteopenia..  Continue calcium and vitamin D. 6.   Referral to urology (Dr Erlene Quan). 7.   Tumor board 02/29/2020. 8.   RTC in 2 weeks for MD assessment.  I discussed the assessment and treatment plan with the patient.  The patient was provided an opportunity to ask questions and all were answered.  The patient agreed  with the plan and demonstrated an understanding of the instructions.  The patient was advised to call back if the symptoms worsen or if the condition fails to improve as anticipated.   Lequita Asal, MD, PhD    02/20/2020, 2:04 PM  I, Mirian Mo Tufford, am acting as Education administrator for [employer]. Mike Gip, MD, PhD.  I, Shreeya Recendiz C. Mike Gip, MD, have reviewed the above documentation for accuracy and completeness, and I agree with the above.

## 2020-02-20 ENCOUNTER — Inpatient Hospital Stay: Payer: Medicare Other | Attending: Hematology and Oncology | Admitting: Hematology and Oncology

## 2020-02-20 ENCOUNTER — Encounter: Payer: Self-pay | Admitting: Hematology and Oncology

## 2020-02-20 ENCOUNTER — Ambulatory Visit
Admission: RE | Admit: 2020-02-20 | Discharge: 2020-02-20 | Disposition: A | Discharge status: Home / Self Care | Payer: Medicare Other | Attending: Hematology and Oncology | Admitting: Hematology and Oncology

## 2020-02-20 ENCOUNTER — Ambulatory Visit
Admission: RE | Admit: 2020-02-20 | Discharge: 2020-02-20 | Disposition: A | Discharge status: Home / Self Care | Payer: Medicare Other | Source: Ambulatory Visit | Attending: Hematology and Oncology | Admitting: Hematology and Oncology

## 2020-02-20 ENCOUNTER — Other Ambulatory Visit: Payer: Self-pay

## 2020-02-20 VITALS — BP 117/72 | HR 62 | Temp 97.6°F | Resp 18 | Wt 188.0 lb

## 2020-02-20 DIAGNOSIS — Z9013 Acquired absence of bilateral breasts and nipples: Secondary | ICD-10-CM | POA: Insufficient documentation

## 2020-02-20 DIAGNOSIS — N2889 Other specified disorders of kidney and ureter: Secondary | ICD-10-CM | POA: Diagnosis present

## 2020-02-20 DIAGNOSIS — E871 Hypo-osmolality and hyponatremia: Secondary | ICD-10-CM | POA: Diagnosis not present

## 2020-02-20 DIAGNOSIS — Z853 Personal history of malignant neoplasm of breast: Secondary | ICD-10-CM | POA: Insufficient documentation

## 2020-02-20 DIAGNOSIS — M8588 Other specified disorders of bone density and structure, other site: Secondary | ICD-10-CM | POA: Diagnosis not present

## 2020-02-20 DIAGNOSIS — Z9223 Personal history of estrogen therapy: Secondary | ICD-10-CM | POA: Insufficient documentation

## 2020-02-20 DIAGNOSIS — Z7189 Other specified counseling: Secondary | ICD-10-CM | POA: Diagnosis not present

## 2020-02-20 DIAGNOSIS — R5381 Other malaise: Secondary | ICD-10-CM | POA: Insufficient documentation

## 2020-02-20 DIAGNOSIS — Z170 Estrogen receptor positive status [ER+]: Secondary | ICD-10-CM | POA: Insufficient documentation

## 2020-02-20 DIAGNOSIS — R5383 Other fatigue: Secondary | ICD-10-CM | POA: Insufficient documentation

## 2020-02-20 DIAGNOSIS — Z79899 Other long term (current) drug therapy: Secondary | ICD-10-CM | POA: Diagnosis not present

## 2020-02-20 DIAGNOSIS — N949 Unspecified condition associated with female genital organs and menstrual cycle: Secondary | ICD-10-CM | POA: Insufficient documentation

## 2020-02-20 LAB — CBC WITH DIFFERENTIAL/PLATELET
Abs Immature Granulocytes: 0.03 10*3/uL (ref 0.00–0.07)
Basophils Absolute: 0.1 10*3/uL (ref 0.0–0.1)
Basophils Relative: 1 %
Eosinophils Absolute: 0.2 10*3/uL (ref 0.0–0.5)
Eosinophils Relative: 3 %
HCT: 36.1 % (ref 36.0–46.0)
Hemoglobin: 11.7 g/dL — ABNORMAL LOW (ref 12.0–15.0)
Immature Granulocytes: 1 %
Lymphocytes Relative: 17 %
Lymphs Abs: 1.1 10*3/uL (ref 0.7–4.0)
MCH: 30.5 pg (ref 26.0–34.0)
MCHC: 32.4 g/dL (ref 30.0–36.0)
MCV: 94.3 fL (ref 80.0–100.0)
Monocytes Absolute: 0.5 10*3/uL (ref 0.1–1.0)
Monocytes Relative: 8 %
Neutro Abs: 4.5 10*3/uL (ref 1.7–7.7)
Neutrophils Relative %: 70 %
Platelets: 159 10*3/uL (ref 150–400)
RBC: 3.83 MIL/uL — ABNORMAL LOW (ref 3.87–5.11)
RDW: 13.3 % (ref 11.5–15.5)
WBC: 6.4 10*3/uL (ref 4.0–10.5)
nRBC: 0 % (ref 0.0–0.2)

## 2020-02-20 LAB — URINALYSIS, COMPLETE (UACMP) WITH MICROSCOPIC
Bilirubin Urine: NEGATIVE
Glucose, UA: NEGATIVE mg/dL
Hgb urine dipstick: NEGATIVE
Nitrite: NEGATIVE
Protein, ur: NEGATIVE mg/dL
RBC / HPF: NONE SEEN RBC/hpf (ref 0–5)
Specific Gravity, Urine: 1.02 (ref 1.005–1.030)
pH: 5 (ref 5.0–8.0)

## 2020-02-20 LAB — COMPREHENSIVE METABOLIC PANEL
ALT: 14 U/L (ref 0–44)
AST: 21 U/L (ref 15–41)
Albumin: 3.9 g/dL (ref 3.5–5.0)
Alkaline Phosphatase: 88 U/L (ref 38–126)
Anion gap: 7 (ref 5–15)
BUN: 22 mg/dL (ref 8–23)
CO2: 26 mmol/L (ref 22–32)
Calcium: 9.2 mg/dL (ref 8.9–10.3)
Chloride: 102 mmol/L (ref 98–111)
Creatinine, Ser: 1.15 mg/dL — ABNORMAL HIGH (ref 0.44–1.00)
GFR, Estimated: 45 mL/min — ABNORMAL LOW (ref >60)
Glucose, Bld: 92 mg/dL (ref 70–99)
Potassium: 3.6 mmol/L (ref 3.5–5.1)
Sodium: 135 mmol/L (ref 135–145)
Total Bilirubin: 0.7 mg/dL (ref 0.3–1.2)
Total Protein: 7.1 g/dL (ref 6.5–8.1)

## 2020-02-20 LAB — LACTATE DEHYDROGENASE: LDH: 155 U/L (ref 98–192)

## 2020-02-22 NOTE — Progress Notes (Signed)
02/23/2020 2:21 PM   Hazen 10-18-1940 [id number]  Referring provider: Toni Arthurs, NP [address] [place],  [place] [address] No chief complaint on file.   HPI: Kristin Mitchell is a 79 y.o. female who presents today for evaluation and management of a right renal mass.   Renal ultrasound on 02/02/2020 noted solid 2.7 cm right renal mass. Findings are compatible with renal cell carcinoma until proven otherwise. Incidental note of a 2.9 cm left adnexal cyst.  The patient is doing well today. She has no pain near her kidney's.   She wears pads and notes having urgency and frequency. She has nocturia. She has increased urinary symptoms at night. She stops drinking fluids in the evenings.   Patient has a personal history of stage 2b right breast cancer, she is managed by Dr. Mike Gip. Patient is s/p neoadjuvant chemotherapy and bilateral mastectomy with right axillary lymph node dissection 07/22/2011. She received Femara x 5 years  (completed 08/2016).   PMH: Past Medical History:  Diagnosis Date  . Arthritis   . Breast cancer ([place]) 11/26/2014  . Cancer ([facility])    breast  . Intermittent palpitations    in early am    Surgical History: Past Surgical History:  Procedure Laterality Date  . ABDOMINAL HYSTERECTOMY    . CATARACT EXTRACTION W/PHACO Left 11/26/2014   Procedure: CATARACT EXTRACTION PHACO AND INTRAOCULAR LENS PLACEMENT (IOC);  Surgeon: Estill Cotta, MD;  Location: [facility] ORS;  Service: Ophthalmology;  Laterality: Left;  US:01:24 AP:26.6 CDE:40.16 PACk [id number] H  . CHOLECYSTECTOMY    . EYE SURGERY    . MASTECTOMY     bilateral    Home Medications:  Allergies as of 02/23/2020      Reactions   Penicillins Rash      Medication List       Accurate as of February 23, 2020  2:21 PM. If you have any questions, ask your nurse or doctor.        acetaminophen 500 MG tablet Commonly known as: TYLENOL Take 500 mg by mouth every 6 (six)  hours as needed.   alendronate 70 MG tablet Commonly known as: FOSAMAX TAKE 1 TABLET BY MOUTH ONCE A WEEK WITH  A  FULL  GLASS  OF  WATER  ON  AN  EMPTY  STOMACH   Calcium 600+D 600-800 MG-UNIT Tabs Generic drug: Calcium Carb-Cholecalciferol Take 1 tablet by mouth daily.   lisinopril 10 MG tablet Commonly known as: ZESTRIL Take 5 mg by mouth daily.   lisinopril 2.5 MG tablet Commonly known as: ZESTRIL Take 2.5 mg by mouth daily.   lovastatin 10 MG tablet Commonly known as: MEVACOR Take 10 mg by mouth at bedtime.   metoprolol succinate 25 MG 24 hr tablet Commonly known as: TOPROL-XL Take 25 mg by mouth daily.   sodium chloride 1 g tablet Take by mouth.   vitamin B-12 1000 MCG tablet Commonly known as: CYANOCOBALAMIN Take 1,000 mcg by mouth daily.   Vitamin D3 50 MCG (2000 UT) Tabs Take 1 tablet by mouth daily.       Allergies:  Allergies  Allergen Reactions  . Penicillins Rash    Family History: Family History  Problem Relation Age of Onset  . Parkinson's disease Mother   . Hodgkin's lymphoma Father   . Prostate cancer Neg Hx   . Bladder Cancer Neg Hx   . Kidney cancer Neg Hx     Social History:  reports that she has never smoked.  She has never used smokeless tobacco. She reports that she does not drink alcohol and does not use drugs.   Physical Exam: BP 121/73   Pulse (!) 108   Ht 5\' 2"  (1.575 m)   Wt 188 lb (85.3 kg)   BMI 34.39 kg/m   Constitutional:  Alert and oriented, No acute distress. HEENT: [place] AT, moist mucus membranes.  Trachea midline, no masses. Cardiovascular: No clubbing, cyanosis, or edema. Respiratory: Normal respiratory effort, no increased work of breathing. Skin: No rashes, bruises or suspicious lesions. Neurologic: Grossly intact, no focal deficits, moving all 4 extremities. Psychiatric: Normal mood and affect.  Laboratory Data:  Lab Results  Component Value Date   CREATININE 1.15 (H) 02/20/2020    Pertinent  Imaging:  Results for orders placed during the hospital encounter of 02/02/20  US RENAL  Narrative CLINICAL DATA:  Renal mass identified on lumbar spine MRI  EXAM: RENAL / URINARY TRACT ULTRASOUND COMPLETE  COMPARISON:  MRI 01/09/2020  FINDINGS: Right Kidney:  Renal measurements: 8.8 x 5.4 x 4.7 cm = volume: 115 mL. Echogenicity within normal limits. Exophytic rounded solid mixed echogenicity mass emanating from the mid to upper pole of the right kidney measuring 2.6 x 2.5 x 2.7 cm. No posterior acoustic features. No discernible vascularity was seen with color Doppler. No shadowing stone or hydronephrosis visualized.  Left Kidney:  Renal measurements: 9.8 x 4.0 x 5.0 cm = volume: 103 mL. Echogenicity within normal limits. No mass, shadowing stone, or hydronephrosis visualized.  Bladder:  Appears normal for degree of bladder distention.  Other:  A simple appearing 2.9 cm cyst is incidentally noted within the left adnexa. No follow-up is necessary.  IMPRESSION: 1. Solid 2.7 cm right renal mass. Findings are compatible with renal cell carcinoma until proven otherwise. 2. Incidental note of a 2.9 cm left adnexal cyst. No follow-up is necessary. This recommendation follows the consensus statement: Management of Asymptomatic Ovarian and Other Adnexal Cysts Imaged at [place]: Society of Radiologists in [place]. Radiology 2010; [phone].   Electronically Signed By: Davina Poke D.O. On: 02/02/2020 17:03  I have personally reviewed the images and agree with radiologist interpretation.    Assessment & Plan:    1. Right renal mass  Renal mass needs further characterization   There is no previous imaging for comparison. The last imaging was a PET scan from 2012.   A solid renal mass raises the suspicion of primary renal malignancy.  We discussed this in detail and in regards to the spectrum of renal masses which includes cysts  (pure cysts are considered benign), solid masses and everything in between. The risk of metastasis increases as the size of solid renal mass increases. In general, it is believed that the risk of metastasis for renal masses less than 3-4 cm is small (up to approximately 5%) based mainly on large retrospective studies.  In some cases and especially in patients of older age and multiple comorbidities a surveillance approach may be appropriate. The treatment of solid renal masses includes: surveillance, cryoablation (percutaneous and laparoscopic) in addition to partial and complete nephrectomy (each with option of laparoscopic, robotic and open depending on appropriateness). Furthermore, nephrectomy appears to be an independent risk factor for the development of chronic kidney disease suggesting that nephron sparing approaches should be implored whenever feasible. We reviewed these options in context of the patients current situation as well as the pros and cons of each. For cystic renal masses, we reviewed the Bosniak classification  and discussed that Bosniak 3 lesions harbor a 50% chance of malignancy whereas Bosniak 4 cysts have a solid and 90-95% are malignant in nature.   Follow up with in 3 months with renal MRI.   2. Left adnexal cyst Incidental finding on renal ultrasound   Recommend follow with Dr. Mike Gip.   3. Urinary urge incontinence Pt is already engaging in behavioral modifications.   -Given 4 Myrbetriq 25 mg daily, # 28 samples; I have advised the patient of the side effects of Myrbetriq, such as: elevation in BP, urinary retention and/or [facility]    [facility] [address], [place], [place] [address] [phone]  I, Selena Batten, am acting as a scribe for Dr. Hollice Espy.  I have reviewed the above documentation for accuracy and completeness, and I agree with the above.   Hollice Espy, MD   I spent 60 total minutes on the day of  the encounter including pre-visit review of the medical record, face-to-face time with the patient, and post visit ordering of labs/imaging/tests.

## 2020-02-23 ENCOUNTER — Other Ambulatory Visit: Payer: Self-pay

## 2020-02-23 ENCOUNTER — Ambulatory Visit (INDEPENDENT_AMBULATORY_CARE_PROVIDER_SITE_OTHER): Payer: Medicare Other | Admitting: Urology

## 2020-02-23 ENCOUNTER — Encounter: Payer: Self-pay | Admitting: Urology

## 2020-02-23 VITALS — BP 121/73 | HR 108 | Ht 62.0 in | Wt 188.0 lb

## 2020-02-23 DIAGNOSIS — N3941 Urge incontinence: Secondary | ICD-10-CM

## 2020-02-23 DIAGNOSIS — N2889 Other specified disorders of kidney and ureter: Secondary | ICD-10-CM | POA: Diagnosis not present

## 2020-02-29 ENCOUNTER — Other Ambulatory Visit: Payer: Medicare Other

## 2020-03-01 NOTE — Progress Notes (Signed)
Tumor Board Documentation  Kristin Mitchell was presented by Dr Mike Gip at our Tumor Board on 02/29/2020, which included representatives from medical oncology, radiation oncology, surgical oncology, internal medicine, navigation, pathology, radiology, surgical, pharmacy, genetics, research, palliative care, pulmonology.  Kristin Mitchell currently presents as a current patient, for discussion with history of the following treatments: active survellience, adjuvant chemotherapy.  Additionally, we reviewed previous medical and familial history, history of present illness, and recent lab results along with all available histopathologic and imaging studies. The tumor board considered available treatment options and made the following recommendations: Additional screening Refer to Urology  The following procedures/referrals were also placed: No orders of the defined types were placed in this encounter.   Clinical Trial Status: not discussed   Staging used: To be determined  AJCC Staging:       Group:  (Renal Mass)  National site-specific guidelines   were discussed with respect to the case.  Tumor board is a meeting of clinicians from various specialty areas who evaluate and discuss patients for whom a multidisciplinary approach is being considered. Final determinations in the plan of care are those of the provider(s). The responsibility for follow up of recommendations given during tumor board is that of the provider.   Today's extended care, comprehensive team conference, Kristin Mitchell was not present for the discussion and was not examined.   Multidisciplinary Tumor Board is a multidisciplinary case peer review process.  Decisions discussed in the Multidisciplinary Tumor Board reflect the opinions of the specialists present at the conference without having examined the patient.  Ultimately, treatment and diagnostic decisions rest with the primary provider(s) and the patient.

## 2020-03-05 NOTE — Progress Notes (Signed)
[facility]  [address] [place], [place] [address] Phone: [phone]  Fax: [phone]   Clinic Day:  03/06/2020  Referring physician: Toni Arthurs, NP  Chief Complaint: Kristin Mitchell is a 79 y.o. female with stage IIB right breast cancer, a right renal mass, and left adnexal cyst who is seen for 2 week assessment.  HPI:  The patient was last seen in the medical oncology clinic on 02/20/2020. At that time, she was doing well.  She denied any hematuria. Hematocrit was 36.1, hemoglobin 11.7, MCV 94.3, platelets 159,000, WBC 6,400. Creatinine was 1.15 (CrCl 45 ml/min). LDH was 155. Urinalysis showed trace ketones and leukocytes and few bacteria. She continued calcium and vitamin D. She was referred to urology for her right renal mass.  CXR on 02/20/2020 revealed no active cardiopulmonary disease.  The patient saw Dr. Erlene Quan on 02/23/2020. She was doing well and denied any pain around her kidneys. She reported urgency and frequency. She was prescribed Myrbetriq.  Additional imaging was felt needed to further characterize the renal mass.  Renal MRI was planned for 3 months.  Tumor board on 02/29/2020 recommended additional screening and referral to urology.  During the interim, she has been good. She denies any new symptoms.   Past Medical History:  Diagnosis Date  . Arthritis   . Breast cancer ([place]) 11/26/2014  . Cancer [facility])    breast  . Intermittent palpitations    in early am    Past Surgical History:  Procedure Laterality Date  . ABDOMINAL HYSTERECTOMY    . CATARACT EXTRACTION W/PHACO Left 11/26/2014   Procedure: CATARACT EXTRACTION PHACO AND INTRAOCULAR LENS PLACEMENT (IOC);  Surgeon: Estill Cotta, MD;  Location: [facility] ORS;  Service: Ophthalmology;  Laterality: Left;  US:01:24 AP:26.6 CDE:40.16 PACk [id number]  . CHOLECYSTECTOMY    . EYE SURGERY    . MASTECTOMY     bilateral    Family History  Problem Relation  Age of Onset  . Parkinson's disease Mother   . Hodgkin's lymphoma Father   . Prostate cancer Neg Hx   . Bladder Cancer Neg Hx   . Kidney cancer Neg Hx     Social History:  reports that she has never smoked. She has never used smokeless tobacco. She reports that she does not drink alcohol and does not use drugs. She does not drink or smoke. She denies any known exposure to radiation or toxins. 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 She lives in [place]. The patient is alone today.  Allergies:  Allergies  Allergen Reactions  . Penicillins Rash    Current Medications: Current Outpatient Medications  Medication Sig Dispense Refill  . acetaminophen (TYLENOL) 500 MG tablet Take 500 mg by mouth every 6 (six) hours as needed.     Marland Kitchen alendronate (FOSAMAX) 70 MG tablet TAKE 1 TABLET BY MOUTH ONCE A WEEK WITH  A  FULL  GLASS  OF  WATER  ON  AN  EMPTY  STOMACH 12 tablet 0  . Calcium Carb-Cholecalciferol (CALCIUM 600+D) 600-800 MG-UNIT TABS Take 1 tablet by mouth daily.    . Cholecalciferol (VITAMIN D3) 50 MCG (2000 UT) TABS Take 1 tablet by mouth daily.    Marland Kitchen lisinopril (PRINIVIL,ZESTRIL) 10 MG tablet Take 5 mg by mouth daily.     Marland Kitchen lisinopril (ZESTRIL) 2.5 MG tablet Take 2.5 mg by mouth daily.    . metoprolol succinate (TOPROL-XL) 25 MG 24 hr tablet Take 25  mg by mouth daily.     . sodium chloride 1 g tablet Take by mouth.    . vitamin B-12 (CYANOCOBALAMIN) 1000 MCG tablet Take 1,000 mcg by mouth daily.    Marland Kitchen lovastatin (MEVACOR) 10 MG tablet Take 10 mg by mouth at bedtime.      No current facility-administered medications for this visit.    Review of Systems  Constitutional: Positive for weight loss (2 lbs). Negative for chills, diaphoresis, fever and malaise/fatigue.       She feels "fine".  HENT: Negative.  Negative for congestion, ear discharge, ear pain, hearing loss, nosebleeds, sinus pain, sore throat and tinnitus.   Eyes: Negative.  Negative for blurred vision.   Respiratory: Negative.  Negative for cough, hemoptysis, sputum production, shortness of breath and wheezing.   Cardiovascular: Negative.  Negative for chest pain, palpitations and leg swelling.  Gastrointestinal: Negative.  Negative for abdominal pain, blood in stool, constipation, diarrhea, heartburn, melena, nausea and vomiting.  Genitourinary: Negative.  Negative for dysuria, frequency, hematuria and urgency.  Musculoskeletal: Negative.  Negative for back pain, joint pain, myalgias and neck pain.  Skin: Negative.  Negative for itching and rash.  Neurological: Negative for dizziness, tingling, sensory change, weakness and headaches.  Endo/Heme/Allergies: Negative.  Does not bruise/bleed easily.  Psychiatric/Behavioral: Negative.  Negative for depression and memory loss. The patient is not nervous/anxious and does not have insomnia.   All other systems reviewed and are negative.  Performance status (ECOG): 0-1  Vitals Blood pressure (!) 128/55, pulse 70, temperature (!) 96.9 F (36.1 C), temperature source Tympanic, resp. rate 18, weight 186 lb (84.4 kg), SpO2 100 %.   Physical Exam Vitals and nursing note reviewed.  Constitutional:      General: She is not in acute distress.    Appearance: Normal appearance. She is well-developed. She is not ill-appearing or diaphoretic.     Comments: She has a rolling walker by her side.  HENT:     Head: Normocephalic and atraumatic.     Comments: Short styled gray hair. Eyes:     General: No scleral icterus.    Extraocular Movements: Extraocular movements intact.     Conjunctiva/sclera: Conjunctivae normal.     Comments: Blue eyes.   Skin:    Coloration: Skin is not jaundiced or pale.  Neurological:     Mental Status: She is alert and oriented to person, place, and time. Mental status is at baseline.  Psychiatric:        Behavior: Behavior normal.        Thought Content: Thought content normal.        Judgment: Judgment normal.    No  visits with results within 3 Day(s) from this visit.  Latest known visit with results is:  Office Visit on 02/20/2020  Component Date Value Ref Range Status  . Color, Urine 02/20/2020 YELLOW  YELLOW Final  . APPearance 02/20/2020 HAZY* CLEAR Final  . Specific Gravity, Urine 02/20/2020 1.020  1.005 - 1.030 Final  . pH 02/20/2020 5.0  5.0 - 8.0 Final  . Glucose, UA 02/20/2020 NEGATIVE  NEGATIVE mg/dL Final  . Hgb urine dipstick 02/20/2020 NEGATIVE  NEGATIVE Final  . Bilirubin Urine 02/20/2020 NEGATIVE  NEGATIVE Final  . Ketones, ur 02/20/2020 TRACE* NEGATIVE mg/dL Final  . Protein, ur 02/20/2020 NEGATIVE  NEGATIVE mg/dL Final  . Nitrite 02/20/2020 NEGATIVE  NEGATIVE Final  . Chalmers Guest 02/20/2020 TRACE* NEGATIVE Final  . Squamous Epithelial / LPF 02/20/2020 6-10  0 - 5 Final  .  WBC, UA 02/20/2020 21-50  0 - 5 WBC/hpf Final  . RBC / HPF 02/20/2020 NONE SEEN  0 - 5 RBC/hpf Final  . Bacteria, UA 02/20/2020 FEW* NONE SEEN Final  . Hyaline Casts, UA 02/20/2020 PRESENT   Final   Performed at [facility], [address] [place], [place] [address]  . LDH 02/20/2020 155  98 - 192 U/L Final   Performed at [facility], [address] [place], [place] [address]  . Sodium 02/20/2020 135  135 - 145 mmol/L Final  . Potassium 02/20/2020 3.6  3.5 - 5.1 mmol/L Final  . Chloride 02/20/2020 102  98 - 111 mmol/L Final  . CO2 02/20/2020 26  22 - 32 mmol/L Final  . Glucose, Bld 02/20/2020 92  70 - 99 mg/dL Final   Glucose reference range applies only to samples taken after fasting for at least 8 hours.  . BUN 02/20/2020 22  8 - 23 mg/dL Final  . Creatinine, Ser 02/20/2020 1.15* 0.44 - 1.00 mg/dL Final  . Calcium 02/20/2020 9.2  8.9 - 10.3 mg/dL Final  . Total Protein 02/20/2020 7.1  6.5 - 8.1 g/dL Final  . Albumin 02/20/2020 3.9  3.5 - 5.0 g/dL Final  . AST 02/20/2020 21  15 - 41 U/L Final  . ALT 02/20/2020 14  0 - 44 U/L Final  . Alkaline Phosphatase 02/20/2020 88  38 -  126 U/L Final  . Total Bilirubin 02/20/2020 0.7  0.3 - 1.2 mg/dL Final  . GFR, Estimated 02/20/2020 45* >60 mL/min Final  . Anion gap 02/20/2020 7  5 - 15 Final   Performed at [facility], [address], [place], [place] [address]  . WBC 02/20/2020 6.4  4.0 - 10.5 K/uL Final  . RBC 02/20/2020 3.83* 3.87 - 5.11 MIL/uL Final  . Hemoglobin 02/20/2020 11.7* 12.0 - 15.0 g/dL Final  . HCT 02/20/2020 36.1  36 - 46 % Final  . MCV 02/20/2020 94.3  80.0 - 100.0 fL Final  . MCH 02/20/2020 30.5  26.0 - 34.0 pg Final  . MCHC 02/20/2020 32.4  30.0 - 36.0 g/dL Final  . RDW 02/20/2020 13.3  11.5 - 15.5 % Final  . Platelets 02/20/2020 159  150 - 400 K/uL Final  . nRBC 02/20/2020 0.0  0.0 - 0.2 % Final  . Neutrophils Relative % 02/20/2020 70  % Final  . Neutro Abs 02/20/2020 4.5  1.7 - 7.7 K/uL Final  . Lymphocytes Relative 02/20/2020 17  % Final  . Lymphs Abs 02/20/2020 1.1  0.7 - 4.0 K/uL Final  . Monocytes Relative 02/20/2020 8  % Final  . Monocytes Absolute 02/20/2020 0.5  0.1 - 1.0 K/uL Final  . Eosinophils Relative 02/20/2020 3  % Final  . Eosinophils Absolute 02/20/2020 0.2  0.0 - 0.5 K/uL Final  . Basophils Relative 02/20/2020 1  % Final  . Basophils Absolute 02/20/2020 0.1  0.0 - 0.1 K/uL Final  . Immature Granulocytes 02/20/2020 1  % Final  . Abs Immature Granulocytes 02/20/2020 0.03  0.00 - 0.07 K/uL Final   Performed at [facility], [address] [place],  [address]    Assessment:  Kristin Mitchell is a 79 y.o. female with stage IIB right breast cancer s/p neoadjuvant chemotherapy and bilateral mastectomy with right axillary lymph node dissection. 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   Diagnostic  mammogram revealed a mass in the upper outer right breast measuring 2.0 x 0.8 x 0.9cm with an adjacent smaller lesion measuring 1.0 x 0.8 x 0.8cm and two  enlarged axillary lymph nodes. Breast MRI on 01/01/2011 showed two areas of enhancement in the right breast 1cm apart, measuring 2.7cm and 2.6cm, with a 2.6cm area of enhancement and significant satellite lesions spanning total 6cm.   PET scan on 12/25/2010 revealed the known right breast mass and enlarged axillary lymph nodes. There was no evidence of abnormal uptake elsewhere in the chest, neck, abdomen, or pelvis.  She was felt to have clinical T2 (multifocal) N1 right breast cancer.  She received neoadjuvant chemotherapy (unknown).  She underwent bilateral mastectomy with right axillary lymph node dissection on 07/22/2011.  Pathology confirmed no residual carcinoma and 3 negative right axillary lymph nodes.   She received Femara x 5 years  (completed 08/2016).   CA27.29 has been followed: 29.6 on 11/23/2012, 27.8 on 11/23/2014, 28.5 on 05/24/2015, 24.7 on 11/22/2015, 27.4 on 05/22/2016, 25.6 on 11/20/2017, 26.9 on 11/26/2017, 25.8 on 11/25/2018, and 27.3 on 12/07/2019.  Bone density on 01/05/2012 showed osteopenia with a T-score of -2.3 in the AP spine L1-L4.  Bone density on 01/11/2013 showed osteopenia with a T-score of -2.3 in the AP spine L1-L4.  Bone density on 01/12/2014 showed osteopenia with a T-score of -2.4.   Bone density on 05/30/2015 showed osteoporosis with a T-score of -2.5 in the AP spine L1-L4.   Bone density on 05/31/2017 showed osteoporosis with a T-score of -2.5 in the AP spine L1-L4.  Bone density on 12/13/2018 revealed osteopenia with a T-score of -2.2 at the lumbar spine.  She has a right renal mass worrisome for renal cell carcinoma.  Lumbar spine MRI without contrast on 01/09/2020 revealed lumbar spondylosis. There was also a 2.6 cm exophytic lesion arising from the right kidney demonstrating T2 intermediate to low signal and T1 intermediate signal. This could reflect a hemorrhagic exophytic right renal cyst. A right renal mass could not be excluded.  Renal ultrasound on  02/02/2020 revealed a solid 2.7 cm right renal mass. Findings were c/w renal cell carcinoma until proven otherwise. There was incidental note of a 2.9 cm left adnexal cyst.   The patient received both doses of the COVID-19 vaccine in the beginning of the year.  Symptomatically, she feels "fine".  She denies any abdominal pain or hematuria.  Plan: 1.   Labs today:  CA125 2.   Right renal mass  She has an asymptomatic right renal mass found incidentally on lumbar spine MRI for bilateral leg pain.  Renal ultrasound confirms a 2.7 cm right renal mass suggestive of renal cell carcinoma.   Lesion appears solid.  If renal cell carcinoma, clinical T1a lesion.  She has been seen by urology.  Options include surveillance or ablative procedure based on age and co-morbidities.  She is scheduled for abdominal MRI in 3 months (05/25/2020). 3.   Stage IIB right breast cancer  She is s/p neoadjuvant chemotherapy followed by surgery and 5 years of Femara.  She denies any symptoms of recurrent disease.  Exam was unremarkable on 12/07/2019.    CA27.29 was 27.3 (normal) on 12/07/2019.    Continue annual surveillance. 4.   Left adnexal cyst  Renal ultrasound on 02/02/2020 revealed a 2.9 cm incidental left adnexal cyst.  Imaging personally reviewed with radiology.  Lesion appears benign.  Discuss plan for pelvic ultrasound.  Check CA-125. 5.   Mild normocytic anemia, resolved  Hematocrit 36.1.  Hemoglobin 11.7.  MCV 94.3 on 02/20/2020.    She denies any bleeding.  Cologuard testing was negative in the past x1.  She denies any hematuria. 5.   Hyponatremia, resolved  Sodium 135.  She is taking NaCl tablets 2/day. 6.   Osteopenia  Bone density on 12/13/2018 revealed osteopenia..  Continue calcium and vitamin D. 7.   RN: Call patient with results. 8.   Pelvis ultrasound on 05/24/2020. 9.   RTC after pelvic ultrasound for MD assessment and review of imaging.  I discussed the assessment and treatment  plan with the patient.  The patient was provided an opportunity to ask questions and all were answered.  The patient agreed with the plan and demonstrated an understanding of the instructions.  The patient was advised to call back if the symptoms worsen or if the condition fails to improve as anticipated.   Lequita Asal, MD, PhD    03/06/2020, 1:32 PM  I, Mirian Mo Tufford, am acting as Education administrator for [employer]. Mike Gip, MD, PhD.  I, Markeshia Giebel C. Mike Gip, MD, have reviewed the above documentation for accuracy and completeness, and I agree with the above.

## 2020-03-06 ENCOUNTER — Inpatient Hospital Stay (HOSPITAL_BASED_OUTPATIENT_CLINIC_OR_DEPARTMENT_OTHER): Payer: Medicare Other | Admitting: Hematology and Oncology

## 2020-03-06 ENCOUNTER — Inpatient Hospital Stay: Payer: Medicare Other

## 2020-03-06 ENCOUNTER — Other Ambulatory Visit: Payer: Self-pay

## 2020-03-06 ENCOUNTER — Encounter: Payer: Self-pay | Admitting: Hematology and Oncology

## 2020-03-06 VITALS — BP 128/55 | HR 70 | Temp 96.9°F | Resp 18 | Wt 186.0 lb

## 2020-03-06 DIAGNOSIS — C50411 Malignant neoplasm of upper-outer quadrant of right female breast: Secondary | ICD-10-CM

## 2020-03-06 DIAGNOSIS — N2889 Other specified disorders of kidney and ureter: Secondary | ICD-10-CM | POA: Diagnosis not present

## 2020-03-06 DIAGNOSIS — M8588 Other specified disorders of bone density and structure, other site: Secondary | ICD-10-CM

## 2020-03-06 DIAGNOSIS — N949 Unspecified condition associated with female genital organs and menstrual cycle: Secondary | ICD-10-CM | POA: Diagnosis not present

## 2020-03-06 DIAGNOSIS — Z170 Estrogen receptor positive status [ER+]: Secondary | ICD-10-CM

## 2020-03-07 LAB — CA 125: Cancer Antigen (CA) 125: 19.3 U/mL (ref 0.0–38.1)

## 2020-05-14 ENCOUNTER — Ambulatory Visit: Payer: Medicare Other

## 2020-05-15 ENCOUNTER — Ambulatory Visit
Admission: RE | Admit: 2020-05-15 | Discharge: 2020-05-15 | Disposition: A | Discharge status: Home / Self Care | Payer: Medicare Other | Source: Ambulatory Visit | Attending: Urology | Admitting: Urology

## 2020-05-15 ENCOUNTER — Other Ambulatory Visit: Payer: Self-pay

## 2020-05-15 ENCOUNTER — Other Ambulatory Visit: Payer: Self-pay | Admitting: Hematology and Oncology

## 2020-05-15 ENCOUNTER — Ambulatory Visit
Admission: RE | Admit: 2020-05-15 | Discharge: 2020-05-15 | Disposition: A | Discharge status: Home / Self Care | Payer: Medicare Other | Source: Ambulatory Visit | Attending: Hematology and Oncology | Admitting: Hematology and Oncology

## 2020-05-15 DIAGNOSIS — N9489 Other specified conditions associated with female genital organs and menstrual cycle: Secondary | ICD-10-CM

## 2020-05-15 DIAGNOSIS — Z85528 Personal history of other malignant neoplasm of kidney: Secondary | ICD-10-CM | POA: Diagnosis present

## 2020-05-15 DIAGNOSIS — N949 Unspecified condition associated with female genital organs and menstrual cycle: Secondary | ICD-10-CM | POA: Insufficient documentation

## 2020-05-15 DIAGNOSIS — N2889 Other specified disorders of kidney and ureter: Secondary | ICD-10-CM | POA: Diagnosis present

## 2020-05-15 MED ORDER — GADOBUTROL 1 MMOL/ML IV SOLN
7.5000 mL | Freq: Once | INTRAVENOUS | Status: AC | PRN
  Administered 2020-05-15: 7.5 mL via INTRAVENOUS

## 2020-05-16 NOTE — Progress Notes (Signed)
[facility]  [address], [address] [place], [place] [address] Phone: [phone]  Fax: [phone]   Clinic Day:  05/20/2020  Referring physician: Toni Arthurs, NP  Chief Complaint: Kristin Mitchell is a 80 y.o. female with stage IIB right breast cancer, a right renal mass, and left adnexal cyst who is seen for 2 month assessment and review of pelvic ultrasound.  HPI:  The patient was last seen in the medical oncology clinic on 03/06/2020. At that time, she felt "fine".  She denied any abdominal pain or hematuria. CA125 was 19.3.  Complete pelvic ultrasound on 05/15/2020 revealed a 2.9 cm simple appearing LEFT adnexal cyst, unchanged from 02/02/2020 ultrasound. No followup imaging was recommended. She was s/p hysterectomy. Ovaries were not visualized.  Abdomen MRI with and without contrast on 05/15/2020 revealed a heterogeneous, exophytic mass arising from the lateral superior pole of the right kidney measuring 2.8 cm, c/w renal cell carcinoma and minimally enlarged compared to prior ultrasound. There was no evidence of renal vein invasion, lymphadenopathy, or metastatic disease within the abdomen.  During the interim, she has been "ok." The patient has been having episodes of lightheadedness in the mornings. If she sits down for a few minutes, she feels better. Three to four weeks ago, while she was at the grocery store, she passed out and hit her head on the floor. She denies nausea, chest pain, palpitations, and tachycardia. She has worn a heart monitor before but it did not show anything.  She stopped taking salt tablets about 3 months ago. She is still taking calcium and vitamin D.  She denies a family history of breast or ovarian cancer.   Past Medical History:  Diagnosis Date  . Arthritis   . Breast cancer ([place]) 11/26/2014  . Cancer [facility])    breast  . Intermittent palpitations    in early am    Past Surgical History:  Procedure Laterality  Date  . ABDOMINAL HYSTERECTOMY    . CATARACT EXTRACTION W/PHACO Left 11/26/2014   Procedure: CATARACT EXTRACTION PHACO AND INTRAOCULAR LENS PLACEMENT (IOC);  Surgeon: Estill Cotta, MD;  Location: [facility] ORS;  Service: Ophthalmology;  Laterality: Left;  US:01:24 AP:26.6 CDE:40.16 PACk [id number]  . CHOLECYSTECTOMY    . EYE SURGERY    . MASTECTOMY     bilateral    Family History  Problem Relation Age of Onset  . Parkinson's disease Mother   . Hodgkin's lymphoma Father   . Prostate cancer Neg Hx   . Bladder Cancer Neg Hx   . Kidney cancer Neg Hx     Social History:  reports that she has never smoked. She has never used smokeless tobacco. She reports that she does not drink alcohol and does not use drugs. She does not drink or smoke. She denies any known exposure to radiation or toxins. 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 She lives in [place]. The patient is alone today.  Allergies:  Allergies  Allergen Reactions  . Penicillins Rash    Current Medications: Current Outpatient Medications  Medication Sig Dispense Refill  . acetaminophen (TYLENOL) 500 MG tablet Take 500 mg by mouth every 6 (six) hours as needed.     Marland Kitchen alendronate (FOSAMAX) 70 MG tablet TAKE 1 TABLET BY MOUTH ONCE A WEEK WITH  A  FULL  GLASS  OF  WATER  ON  AN  EMPTY  STOMACH 12 tablet 0  . Calcium Carb-Cholecalciferol (CALCIUM 600+D) 600-800 MG-UNIT  TABS Take 1 tablet by mouth daily.    . Cholecalciferol (VITAMIN D3) 50 MCG (2000 UT) TABS Take 1 tablet by mouth daily.    . lovastatin (MEVACOR) 10 MG tablet Take 10 mg by mouth at bedtime.     . metoprolol succinate (TOPROL-XL) 25 MG 24 hr tablet Take 25 mg by mouth daily.     . vitamin B-12 (CYANOCOBALAMIN) 1000 MCG tablet Take 1,000 mcg by mouth daily.    . lisinopril (PRINIVIL,ZESTRIL) 10 MG tablet Take 5 mg by mouth daily.  (Patient not taking: Reported on 05/20/2020)    . lisinopril (ZESTRIL) 2.5 MG tablet Take 2.5 mg by mouth daily.  (Patient not taking: Reported on 05/20/2020)    . sodium chloride 1 g tablet Take by mouth. (Patient not taking: Reported on 05/20/2020)     No current facility-administered medications for this visit.    Review of Systems  Constitutional: Negative for chills, diaphoresis, fever, malaise/fatigue and weight loss (stable).       Feels "ok."  HENT: Negative.  Negative for congestion, ear discharge, ear pain, hearing loss, nosebleeds, sinus pain, sore throat and tinnitus.   Eyes: Negative.  Negative for blurred vision.  Respiratory: Negative.  Negative for cough, hemoptysis, sputum production, shortness of breath and wheezing.   Cardiovascular: Negative.  Negative for chest pain, palpitations and leg swelling.  Gastrointestinal: Negative.  Negative for abdominal pain, blood in stool, constipation, diarrhea, heartburn, melena, nausea and vomiting.  Genitourinary: Negative.  Negative for dysuria, frequency, hematuria and urgency.  Musculoskeletal: Negative.  Negative for back pain, joint pain, myalgias and neck pain.  Skin: Negative.  Negative for itching and rash.  Neurological: Positive for dizziness (episodes of lightheadedness) and loss of consciousness (3-4 weeks ago). Negative for tingling, sensory change, weakness and headaches.  Endo/Heme/Allergies: Negative.  Does not bruise/bleed easily.  Psychiatric/Behavioral: Negative.  Negative for depression and memory loss. The patient is not nervous/anxious and does not have insomnia.   All other systems reviewed and are negative.  Performance status (ECOG): 0-1  Vitals Blood pressure 140/66, pulse 67, temperature (!) 97.3 F (36.3 C), temperature source Tympanic, resp. rate 18, weight 186 lb 4.6 oz (84.5 kg), SpO2 100 %.   Physical Exam Vitals and nursing note reviewed.  Constitutional:      General: She is not in acute distress.    Appearance: Normal appearance. She is well-developed. She is not ill-appearing or diaphoretic.     Comments:  She has a rolling walker by her side.  HENT:     Head: Normocephalic and atraumatic.     Comments: Short styled gray hair. Eyes:     General: No scleral icterus.    Extraocular Movements: Extraocular movements intact.     Conjunctiva/sclera: Conjunctivae normal.     Comments: Blue eyes.   Skin:    Coloration: Skin is not jaundiced or pale.  Neurological:     Mental Status: She is alert and oriented to person, place, and time. Mental status is at baseline.  Psychiatric:        Behavior: Behavior normal.        Thought Content: Thought content normal.        Judgment: Judgment normal.    No visits with results within 3 Day(s) from this visit.  Latest known visit with results is:  Office Visit on 03/06/2020  Component Date Value Ref Range Status  . Cancer Antigen (CA) 125 03/06/2020 19.3  0.0 - 38.1 U/mL Final   Comment: (  NOTE) Roche Diagnostics Electrochemiluminescence Immunoassay (ECLIA) Values obtained with different assay methods or kits cannot be used interchangeably.  Results cannot be interpreted as absolute evidence of the presence or absence of malignant disease. Performed At: [facility] [address] [place], [place] [id number] Nagendra Sanjai MD Ph:[phone]     Assessment:  Taziah Parker Hasten is a 79 y.o. female with stage IIB right breast cancer s/p neoadjuvant chemotherapy and bilateral mastectomy with right axillary lymph node dissection. 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   Diagnostic mammogram revealed a mass in the upper outer right breast measuring 2.0 x 0.8 x 0.9cm with an adjacent smaller lesion measuring 1.0 x 0.8 x 0.8cm and two enlarged axillary lymph nodes. Breast MRI on 01/01/2011 showed two areas of enhancement in the right breast 1cm apart, measuring 2.7cm and 2.6cm, with a 2.6cm area of enhancement and significant satellite lesions spanning  total 6cm.   PET scan on 12/25/2010 revealed the known right breast mass and enlarged axillary lymph nodes. There was no evidence of abnormal uptake elsewhere in the chest, neck, abdomen, or pelvis.  She was felt to have clinical T2 (multifocal) N1 right breast cancer.  She received neoadjuvant chemotherapy (unknown).  She underwent bilateral mastectomy with right axillary lymph node dissection on 07/22/2011.  Pathology confirmed no residual carcinoma and 3 negative right axillary lymph nodes.   She received Femara x 5 years  (completed 08/2016).   CA27.29 has been followed: 29.6 on 11/23/2012, 27.8 on 11/23/2014, 28.5 on 05/24/2015, 24.7 on 11/22/2015, 27.4 on 05/22/2016, 25.6 on 11/20/2017, 26.9 on 11/26/2017, 25.8 on 11/25/2018, and 27.3 on 12/07/2019.   Bone density on 01/05/2012 showed osteopenia with a T-score of -2.3 in the AP spine L1-L4.  Bone density on 01/11/2013 showed osteopenia with a T-score of -2.3 in the AP spine L1-L4.  Bone density on 01/12/2014 showed osteopenia with a T-score of -2.4.   Bone density on 05/30/2015 showed osteoporosis with a T-score of -2.5 in the AP spine L1-L4.   Bone density on 05/31/2017 showed osteoporosis with a T-score of -2.5 in the AP spine L1-L4.  Bone density on 12/13/2018 revealed osteopenia with a T-score of -2.2 at the lumbar spine.   She has a right renal mass worrisome for renal cell carcinoma.  Lumbar spine MRI without contrast on 01/09/2020 revealed lumbar spondylosis. There was also a 2.6 cm exophytic lesion arising from the right kidney demonstrating T2 intermediate to low signal and T1 intermediate signal. This could reflect a hemorrhagic exophytic right renal cyst. A right renal mass could not be excluded.  Renal ultrasound on 02/02/2020 revealed a solid 2.7 cm right renal mass. Findings were c/w renal cell carcinoma until proven otherwise. There was incidental note of a 2.9 cm left adnexal cyst.  Complete pelvic ultrasound on 05/15/2020  revealed a 2.9 cm simple appearing LEFT adnexal cyst, unchanged from 02/02/2020 ultrasound. No followup imaging was recommended. She was s/p hysterectomy. Ovaries were not visualized.  Abdomen MRI with and without contrast on 05/15/2020 revealed a heterogeneous, exophytic mass arising from the lateral superior pole of the right kidney measuring 2.8 cm, c/w renal cell carcinoma and minimally enlarged compared to prior ultrasound. There was no evidence of renal vein invasion, lymphadenopathy, or metastatic disease within the abdomen.  The patient received both doses of the COVID-19 vaccine in the beginning of the year.  Symptomatically, she feels "ok." She has been having episodes   of lightheadedness in the mornings.  She had a syncopal event 4 weeks ago.  She denies any abdominal symptoms.  Exam is stable.  Plan: 1.   Right renal mass  She has an asymptomatic right renal mass found incidentally on lumbar spine MRI for bilateral leg pain.  Renal ultrasound on 02/02/2020 confirmed a 2.7 cm right renal mass suggestive of renal cell carcinoma.   Lesion appears solid.  If renal cell carcinoma, clinical T1a lesion.  Abdomen MRI on 05/15/2020 was personally reviewed.  Agree with radiology findings.   There is minimal enlargement of the 2.8 cm exophytic mass arising from the lateral pole of the right kidney.   Imaging is suggestive of renal cell carcinoma.   There is no renal vein invasion, adenopathy, or evidence of metastatic disease.  Options include surveillance or ablative procedure based on age and co-morbidities.   Risk of metastasis based on size is about 5%.  She has a follow-up appointment on 05/31/2020 with Dr Hollice Espy.   Anticipate ongoing surveillance with close follow-up imaging. 2.   Stage IIB right breast cancer  She is s/p neoadjuvant chemotherapy followed by surgery and 5 years of Femara.  Symptomatically, she is doing well.  Exam was unremarkable on 12/07/2019.    CA27.29 was  27.3 (normal) on 12/07/2019.    Continue annual surveillance. 3.   Left adnexal cyst  Renal ultrasound on 02/02/2020 revealed a 2.9 cm incidental left adnexal cyst.   Imaging was personally reviewed with radiology.  Lesion appears benign.  Complete pelvic ultrasound on 05/15/2020 revealed a 2.9 cm simple appearing LEFT adnexal cyst, unchanged.    No followup imaging was recommended.  CA-125 was 19.3 (normal) on 03/06/2020. 4.   Osteopenia  Bone density on 12/13/2018 revealed osteopenia..  Anticipate next bone density study on 12/12/2020.    Continue calcium and vitamin D. 5.   RTC around mid 11/22/2020 for MD assessment including breast exam and labs (CBC with diff, CMP, CA27.29, CA125).  I discussed the assessment and treatment plan with the patient.  The patient was provided an opportunity to ask questions and all were answered.  The patient agreed with the plan and demonstrated an understanding of the instructions.  The patient was advised to call back if the symptoms worsen or if the condition fails to improve as anticipated.   Lequita Asal, MD, PhD    05/20/2020, 2:12 PM  I, Mirian Mo Tufford, am acting as Education administrator for [employer]. Mike Gip, MD, PhD.  I, Jashawn Floyd C. Mike Gip, MD, have reviewed the above documentation for accuracy and completeness, and I agree with the above.

## 2020-05-20 ENCOUNTER — Inpatient Hospital Stay: Payer: Medicare Other | Attending: Hematology and Oncology | Admitting: Hematology and Oncology

## 2020-05-20 ENCOUNTER — Other Ambulatory Visit: Payer: Self-pay

## 2020-05-20 ENCOUNTER — Encounter: Payer: Self-pay | Admitting: Hematology and Oncology

## 2020-05-20 VITALS — BP 140/66 | HR 67 | Temp 97.3°F | Resp 18 | Wt 186.3 lb

## 2020-05-20 DIAGNOSIS — M8588 Other specified disorders of bone density and structure, other site: Secondary | ICD-10-CM | POA: Diagnosis not present

## 2020-05-20 DIAGNOSIS — M8580 Other specified disorders of bone density and structure, unspecified site: Secondary | ICD-10-CM | POA: Diagnosis not present

## 2020-05-20 DIAGNOSIS — M1990 Unspecified osteoarthritis, unspecified site: Secondary | ICD-10-CM | POA: Insufficient documentation

## 2020-05-20 DIAGNOSIS — D649 Anemia, unspecified: Secondary | ICD-10-CM

## 2020-05-20 DIAGNOSIS — Z9223 Personal history of estrogen therapy: Secondary | ICD-10-CM | POA: Insufficient documentation

## 2020-05-20 DIAGNOSIS — Z79899 Other long term (current) drug therapy: Secondary | ICD-10-CM | POA: Insufficient documentation

## 2020-05-20 DIAGNOSIS — R42 Dizziness and giddiness: Secondary | ICD-10-CM | POA: Insufficient documentation

## 2020-05-20 DIAGNOSIS — Z9013 Acquired absence of bilateral breasts and nipples: Secondary | ICD-10-CM | POA: Diagnosis not present

## 2020-05-20 DIAGNOSIS — Z170 Estrogen receptor positive status [ER+]: Secondary | ICD-10-CM | POA: Diagnosis not present

## 2020-05-20 DIAGNOSIS — C50411 Malignant neoplasm of upper-outer quadrant of right female breast: Secondary | ICD-10-CM | POA: Diagnosis not present

## 2020-05-20 DIAGNOSIS — N949 Unspecified condition associated with female genital organs and menstrual cycle: Secondary | ICD-10-CM

## 2020-05-20 DIAGNOSIS — N2889 Other specified disorders of kidney and ureter: Secondary | ICD-10-CM | POA: Diagnosis present

## 2020-05-20 DIAGNOSIS — Z853 Personal history of malignant neoplasm of breast: Secondary | ICD-10-CM | POA: Insufficient documentation

## 2020-05-20 DIAGNOSIS — Z9221 Personal history of antineoplastic chemotherapy: Secondary | ICD-10-CM | POA: Diagnosis not present

## 2020-05-20 DIAGNOSIS — Z7189 Other specified counseling: Secondary | ICD-10-CM

## 2020-05-20 NOTE — Progress Notes (Signed)
Patient here for oncology follow-up appointment, expresse complaints of joint pain and lightheadedness with recent fall.

## 2020-05-31 ENCOUNTER — Ambulatory Visit: Payer: Medicare Other | Admitting: Urology

## 2020-05-31 ENCOUNTER — Telehealth (INDEPENDENT_AMBULATORY_CARE_PROVIDER_SITE_OTHER): Payer: Medicare Other | Admitting: Urology

## 2020-05-31 DIAGNOSIS — N2889 Other specified disorders of kidney and ureter: Secondary | ICD-10-CM

## 2020-05-31 DIAGNOSIS — N3941 Urge incontinence: Secondary | ICD-10-CM | POA: Diagnosis not present

## 2020-05-31 NOTE — Progress Notes (Signed)
This service is provided via telemedicine   No vital signs collected/recorded due to the encounter was a telemedicine visit.     Patient consents to a telephone visit:  Yes    Names of all persons participating in the telemedicine service and their role in the encounter:  Aris Moman O`Sullivan, RMA   

## 2020-05-31 NOTE — Progress Notes (Signed)
Virtual Visit via Telephone Note  I connected with Kristin Mitchell on 05/31/20 at 12:00 PM EST by telephone and verified that I am speaking with the correct person using two identifiers.  Location: Patient: home  Provider: office   I discussed the limitations, risks, security and privacy concerns of performing an evaluation and management service by telephone and the availability of in person appointments. I also discussed with the patient that there may be a patient responsible charge related to this service. The patient expressed understanding and agreed to proceed.   History of Present Illness: 80 year old female with incidental right renal mass who presents today for follow-up.  In the interim, she underwent more definitive imaging in the form of MRI of the abdomen with and without contrast. This indicated a 2.8 x 2.8 x 2.8 cm posterior right upper pole heterogeneous exophytic mass which was felt to be consistent with renal cell carcinoma. There is no evidence of disease outside of this isolated lesion.  There may have perhaps been slight interval growth since renal ultrasound although change in modality.  She denies any flank pain.  She also has a personal history of urinary urgency and frequency. Last visit, she was given samples of Myrbetriq 25 mg x 1 month. She reports today that she never took this medication. She reports that she is doing all right and does not want to add any medications to her regimen. She likes to void medicine if possible. She still has it if her symptoms worsen.  She denies any dysuria or gross hematuria. No UTIs.   Observations/Objective: Pleasant  Assessment and Plan:  1. Right renal mass MRI more definitive, concerning for renal cell carcinoma albeit relatively small 2.8 cm.  Difficult to assess whether there is been true interval growth since her renal ultrasound in 01/2020 given change in modality  We do lengthy discussion today about the  various treatment options which include surveillance, excision, or cryotherapy. We discussed risk and benefits of both.  We discussed the natural history of small renal masses. She understands that the risk of metastatic disease is less than 5% annually at this size. She also understands the criteria for surveillance and how growth rate affects this.  She strongly prefers surveillance at this time. She may consider cryotherapy down the road if the lesion has significant interval growth.  All questions answered  Plan for renal MRI in 6 months  2. Urge incontinence Baseline urinary urgency frequency, previously given Myrbetriq but never took this  She is not interested in further treatment at this time but will try the Myrbetriq if her symptoms worsen  3. Other specified disorders of kidney and ureter - MR Abdomen W Contrast; Future   Follow Up Instructions: Follow-up 6 months with renal MRI   I discussed the assessment and treatment plan with the patient. The patient was provided an opportunity to ask questions and all were answered. The patient agreed with the plan and demonstrated an understanding of the instructions.   The patient was advised to call back or seek an in-person evaluation if the symptoms worsen or if the condition fails to improve as anticipated.  I provided 8 minutes of non-face-to-face time during this encounter.   I spent 30 total minutes on the day of the encounter including pre-visit review of the medical record, face-to-face time with the patient, and post visit ordering of labs/imaging/tests. MRI was personally reviewed. It was also compared to previous renal ultrasound. Additional records including labs and office  notes reviewed    Hollice Espy, MD

## 2020-07-02 ENCOUNTER — Other Ambulatory Visit: Payer: Self-pay

## 2020-07-02 DIAGNOSIS — M818 Other osteoporosis without current pathological fracture: Secondary | ICD-10-CM

## 2020-07-02 MED ORDER — ALENDRONATE SODIUM 70 MG PO TABS: ORAL_TABLET | ORAL | 0 refills | Status: AC

## 2020-11-18 ENCOUNTER — Ambulatory Visit: Payer: Medicare Other | Admitting: Hematology and Oncology

## 2020-11-18 ENCOUNTER — Other Ambulatory Visit: Payer: Self-pay | Admitting: *Deleted

## 2020-11-18 ENCOUNTER — Other Ambulatory Visit: Payer: Medicare Other

## 2020-11-18 DIAGNOSIS — Z170 Estrogen receptor positive status [ER+]: Secondary | ICD-10-CM

## 2020-11-18 DIAGNOSIS — N2889 Other specified disorders of kidney and ureter: Secondary | ICD-10-CM

## 2020-11-18 DIAGNOSIS — C50411 Malignant neoplasm of upper-outer quadrant of right female breast: Secondary | ICD-10-CM

## 2020-11-20 ENCOUNTER — Inpatient Hospital Stay (HOSPITAL_BASED_OUTPATIENT_CLINIC_OR_DEPARTMENT_OTHER): Payer: Medicare Other | Admitting: Oncology

## 2020-11-20 ENCOUNTER — Encounter: Payer: Self-pay | Admitting: Oncology

## 2020-11-20 ENCOUNTER — Other Ambulatory Visit: Payer: Self-pay

## 2020-11-20 ENCOUNTER — Inpatient Hospital Stay: Payer: Medicare Other | Attending: Oncology

## 2020-11-20 VITALS — BP 134/66 | HR 70 | Temp 97.9°F | Resp 16 | Wt 190.8 lb

## 2020-11-20 DIAGNOSIS — Z08 Encounter for follow-up examination after completed treatment for malignant neoplasm: Secondary | ICD-10-CM | POA: Diagnosis not present

## 2020-11-20 DIAGNOSIS — Z9221 Personal history of antineoplastic chemotherapy: Secondary | ICD-10-CM | POA: Diagnosis not present

## 2020-11-20 DIAGNOSIS — Z85828 Personal history of other malignant neoplasm of skin: Secondary | ICD-10-CM | POA: Diagnosis not present

## 2020-11-20 DIAGNOSIS — Z9013 Acquired absence of bilateral breasts and nipples: Secondary | ICD-10-CM | POA: Diagnosis not present

## 2020-11-20 DIAGNOSIS — Z79899 Other long term (current) drug therapy: Secondary | ICD-10-CM | POA: Insufficient documentation

## 2020-11-20 DIAGNOSIS — N2889 Other specified disorders of kidney and ureter: Secondary | ICD-10-CM | POA: Diagnosis present

## 2020-11-20 DIAGNOSIS — M8580 Other specified disorders of bone density and structure, unspecified site: Secondary | ICD-10-CM | POA: Insufficient documentation

## 2020-11-20 DIAGNOSIS — Z853 Personal history of malignant neoplasm of breast: Secondary | ICD-10-CM | POA: Diagnosis not present

## 2020-11-20 DIAGNOSIS — C50411 Malignant neoplasm of upper-outer quadrant of right female breast: Secondary | ICD-10-CM

## 2020-11-20 DIAGNOSIS — Z170 Estrogen receptor positive status [ER+]: Secondary | ICD-10-CM

## 2020-11-20 LAB — CBC WITH DIFFERENTIAL/PLATELET
Abs Immature Granulocytes: 0.01 10*3/uL (ref 0.00–0.07)
Basophils Absolute: 0.1 10*3/uL (ref 0.0–0.1)
Basophils Relative: 1 %
Eosinophils Absolute: 0.2 10*3/uL (ref 0.0–0.5)
Eosinophils Relative: 4 %
HCT: 34.6 % — ABNORMAL LOW (ref 36.0–46.0)
Hemoglobin: 11.7 g/dL — ABNORMAL LOW (ref 12.0–15.0)
Immature Granulocytes: 0 %
Lymphocytes Relative: 21 %
Lymphs Abs: 1.1 10*3/uL (ref 0.7–4.0)
MCH: 31 pg (ref 26.0–34.0)
MCHC: 33.8 g/dL (ref 30.0–36.0)
MCV: 91.5 fL (ref 80.0–100.0)
Monocytes Absolute: 0.5 10*3/uL (ref 0.1–1.0)
Monocytes Relative: 10 %
Neutro Abs: 3.3 10*3/uL (ref 1.7–7.7)
Neutrophils Relative %: 64 %
Platelets: 162 10*3/uL (ref 150–400)
RBC: 3.78 MIL/uL — ABNORMAL LOW (ref 3.87–5.11)
RDW: 13 % (ref 11.5–15.5)
WBC: 5.3 10*3/uL (ref 4.0–10.5)
nRBC: 0 % (ref 0.0–0.2)

## 2020-11-20 LAB — COMPREHENSIVE METABOLIC PANEL
ALT: 15 U/L (ref 0–44)
AST: 23 U/L (ref 15–41)
Albumin: 3.9 g/dL (ref 3.5–5.0)
Alkaline Phosphatase: 98 U/L (ref 38–126)
Anion gap: 7 (ref 5–15)
BUN: 18 mg/dL (ref 8–23)
CO2: 27 mmol/L (ref 22–32)
Calcium: 9.2 mg/dL (ref 8.9–10.3)
Chloride: 97 mmol/L — ABNORMAL LOW (ref 98–111)
Creatinine, Ser: 1.06 mg/dL — ABNORMAL HIGH (ref 0.44–1.00)
GFR, Estimated: 53 mL/min — ABNORMAL LOW (ref >60)
Glucose, Bld: 99 mg/dL (ref 70–99)
Potassium: 3.7 mmol/L (ref 3.5–5.1)
Sodium: 131 mmol/L — ABNORMAL LOW (ref 135–145)
Total Bilirubin: 0.6 mg/dL (ref 0.3–1.2)
Total Protein: 7.3 g/dL (ref 6.5–8.1)

## 2020-11-20 NOTE — Progress Notes (Signed)
Hematology/Oncology Consult note [facility]  Telephone:([phone] [phone]  Patient Care Team: Toni Arthurs, NP as PCP - General (Family Medicine) Lequita Asal, MD as Referring Physician (Hematology and Oncology)   Name of the patient: Kristin Mitchell  [id number]  Sep 11, 1940   Date of visit: 11/20/20  Diagnosis- 1.  Renal mass 2.  Left adnexal cyst benign 3.  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 female with stage IIB right breast cancer s/p neoadjuvant chemotherapy and bilateral mastectomy with right axillary lymph node dissection. 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 She underwent bilateral mastectomy with right axillary lymph node dissection on 07/22/2011.  Pathology confirmed no residual carcinoma and 3 negative right axillary lymph nodes. She received Femara x 5 years  (completed 08/2016).  Patient has known right renal mass that is concerning for renal cell carcinoma and is being followed by urology.  She has not required any intervention for it so far.  Incidentally noted left adnexal cyst which was found to be benign and no follow-up needed for the same.  History of osteopenia for which patient is on weekly Fosamax and follows up with PCP    Interval history-patient recently underwent excision of skin cancer over her right arm by Dr. Phillip Heal.  She reports occasional pain in her right upper extremity.  Denies other complaints at this time  ECOG PS- 2 Pain scale- 0   Review of systems- Review of Systems  Constitutional:  Negative for chills, fever, malaise/fatigue and weight loss.  HENT:  Negative for congestion, ear discharge  and nosebleeds.   Eyes:  Negative for blurred vision.  Respiratory:  Negative for cough, hemoptysis, sputum production, shortness of breath and wheezing.   Cardiovascular:  Negative for chest pain, palpitations, orthopnea and claudication.  Gastrointestinal:  Negative for abdominal pain, blood in stool, constipation, diarrhea, heartburn, melena, nausea and vomiting.  Genitourinary:  Negative for dysuria, flank pain, frequency, hematuria and urgency.  Musculoskeletal:  Negative for back pain, joint pain and myalgias.       Right arm pain  Skin:  Negative for rash.  Neurological:  Negative for dizziness, tingling, focal weakness, seizures, weakness and headaches.  Endo/Heme/Allergies:  Does not bruise/bleed easily.  Psychiatric/Behavioral:  Negative for depression and suicidal ideas. The patient does not have insomnia.      Allergies  Allergen Reactions   Penicillins Rash     Past Medical History:  Diagnosis Date   Arthritis    Breast cancer ([place]) 11/26/2014   Cancer ([place])    breast   Intermittent palpitations    in early am     Past Surgical History:  Procedure Laterality Date   ABDOMINAL HYSTERECTOMY     CATARACT EXTRACTION W/PHACO Left 11/26/2014   Procedure: CATARACT EXTRACTION PHACO AND INTRAOCULAR LENS PLACEMENT ([place]);  Surgeon: Estill Cotta, MD;  Location: [facility] ORS;  Service: Ophthalmology;  Laterality: Left;  US:01:24 AP:26.6 CDE:40.16 PACk [id number] H   CHOLECYSTECTOMY     EYE SURGERY     MASTECTOMY     bilateral    Social History   Socioeconomic History   Marital status: Married    Spouse name: Not on file   Number of children: Not on file   Years  of education: Not on file   Highest education level: Not on file  Occupational History   Not on file  Tobacco Use   Smoking status: Never   Smokeless tobacco: Never  Substance and Sexual Activity   Alcohol use: No   Drug use: Never   Sexual activity: Not on file  Other Topics Concern   Not on file   Social History Narrative   Not on file   Social Determinants of Health   Financial Resource Strain: Not on file  Food Insecurity: Not on file  Transportation Needs: Not on file  Physical Activity: Not on file  Stress: Not on file  Social Connections: Not on file  Intimate Partner Violence: Not on file    Family History  Problem Relation Age of Onset   Parkinson's disease Mother    Hodgkin's lymphoma Father    Prostate cancer Neg Hx    Bladder Cancer Neg Hx    Kidney cancer Neg Hx      Current Outpatient Medications:    acetaminophen (TYLENOL) 500 MG tablet, Take 500 mg by mouth every 6 (six) hours as needed. , Disp: , Rfl:    alendronate (FOSAMAX) 70 MG tablet, Take with a full glass of water on an empty stomach., Disp: 12 tablet, Rfl: 0   Calcium Carb-Cholecalciferol (CALCIUM 600+D) 600-800 MG-UNIT TABS, Take 1 tablet by mouth daily., Disp: , Rfl:    Cholecalciferol (VITAMIN D3) 50 MCG (2000 UT) TABS, Take 1 tablet by mouth daily., Disp: , Rfl:    Cholecalciferol 50 MCG (2000 UT) CAPS, Take by mouth., Disp: , Rfl:    clindamycin (CLEOCIN) 300 MG capsule, Take 300 mg by mouth 3 (three) times daily., Disp: , Rfl:    cyanocobalamin 1000 MCG tablet, Take by mouth., Disp: , Rfl:    lovastatin (MEVACOR) 10 MG tablet, Take 10 mg by mouth at bedtime. , Disp: , Rfl:    lovastatin (MEVACOR) 10 MG tablet, Take by mouth., Disp: , Rfl:    metoprolol succinate (TOPROL-XL) 25 MG 24 hr tablet, Take 25 mg by mouth daily. , Disp: , Rfl:    sodium chloride 1 g tablet, Take 1 g by mouth 2 (two) times daily., Disp: , Rfl:    vitamin B-12 (CYANOCOBALAMIN) 1000 MCG tablet, Take 1,000 mcg by mouth daily., Disp: , Rfl:   Physical exam:  Vitals:   11/20/20 1327  BP: 134/66  Pulse: 70  Resp: 16  Temp: 97.9 F (36.6 C)  SpO2: 97%  Weight: 190 lb 12.9 oz (86.5 kg)   Physical Exam Constitutional:      General: She is not in acute distress. Cardiovascular:     Rate and Rhythm: Normal rate  and regular rhythm.     Heart sounds: Normal heart sounds.  Pulmonary:     Effort: Pulmonary effort is normal.     Breath sounds: Normal breath sounds.  Abdominal:     General: Bowel sounds are normal.     Palpations: Abdomen is soft.  Skin:    General: Skin is warm and dry.  Neurological:     Mental Status: She is alert and oriented to person, place, and time.    Breast exam is performed in seated and lying down position. Patient is status post bilateral mastectomy without reconstruction. No evidence of any chest wall recurrence. No evidence of bilateral axillary adenopathy   CMP Latest Ref Rng & Units 11/20/2020  Glucose 70 - 99 mg/dL 99  BUN 8 - 23  mg/dL 18  Creatinine 0.44 - 1.00 mg/dL 1.06(H)  Sodium 135 - 145 mmol/L 131(L)  Potassium 3.5 - 5.1 mmol/L 3.7  Chloride 98 - 111 mmol/L 97(L)  CO2 22 - 32 mmol/L 27  Calcium 8.9 - 10.3 mg/dL 9.2  Total Protein 6.5 - 8.1 g/dL 7.3  Total Bilirubin 0.3 - 1.2 mg/dL 0.6  Alkaline Phos 38 - 126 U/L 98  AST 15 - 41 U/L 23  ALT 0 - 44 U/L 15   CBC Latest Ref Rng & Units 11/20/2020  WBC 4.0 - 10.5 K/uL 5.3  Hemoglobin 12.0 - 15.0 g/dL 11.7(L)  Hematocrit 36.0 - 46.0 % 34.6(L)  Platelets 150 - 400 K/uL 162      Assessment and plan- Patient is a 80 y.o. female who is here for follow-up of following issues:  History of breast cancer in 2012: It is close to 10 years since her diagnosis of breast cancer and she is s/p bilateral mastectomy.  On today's exam no concerning signs and symptoms of recurrence.  Since she is more than 5 years out from her breast cancer she does not require any follow-up with oncology at this time.  No surveillance imaging or blood work is needed as well.  She can continue to follow-up with her primary care provider History of right renal cell cancer: This is being observed by urology and she has an appointment coming up with Dr. Erlene Quan soon. Left adnexal cyst: Benign and does not require follow-up History of  osteopenia on Fosamax can be followed up with PCP Right arm pain: No right upper extremity swelling noted.  Unclear if it is secondary to recent squamous cell carcinoma excision versus possible lymphedema from prior mastectomy.  Patient knows to call us if her symptoms do not resolve.  Patient does not require any follow-up with me at this time but can be sent back in the future if questions or concerns arise   Visit Diagnosis 1. Encounter for follow-up surveillance of breast cancer      Dr. Randa Evens, MD, MPH [facility] [phone] 11/20/2020 3:21 PM

## 2020-11-21 LAB — CANCER ANTIGEN 27.29: CA 27.29: 18 U/mL (ref 0.0–38.6)

## 2020-11-28 ENCOUNTER — Other Ambulatory Visit: Payer: Self-pay

## 2020-11-28 DIAGNOSIS — N2889 Other specified disorders of kidney and ureter: Secondary | ICD-10-CM

## 2020-11-29 ENCOUNTER — Ambulatory Visit: Payer: Self-pay | Admitting: Urology

## 2020-12-05 ENCOUNTER — Ambulatory Visit
Admission: RE | Admit: 2020-12-05 | Discharge: 2020-12-05 | Disposition: A | Discharge status: Home / Self Care | Payer: Medicare Other | Source: Ambulatory Visit | Attending: Urology | Admitting: Urology

## 2020-12-05 ENCOUNTER — Other Ambulatory Visit: Payer: Self-pay

## 2020-12-05 DIAGNOSIS — N2889 Other specified disorders of kidney and ureter: Secondary | ICD-10-CM | POA: Diagnosis present

## 2020-12-05 MED ORDER — GADOBUTROL 1 MMOL/ML IV SOLN
8.0000 mL | Freq: Once | INTRAVENOUS | Status: AC | PRN
  Administered 2020-12-05: 8 mL via INTRAVENOUS

## 2020-12-09 ENCOUNTER — Ambulatory Visit: Payer: Medicare Other | Admitting: Hematology and Oncology

## 2020-12-09 ENCOUNTER — Other Ambulatory Visit: Payer: Medicare Other

## 2020-12-13 ENCOUNTER — Ambulatory Visit: Payer: Medicare Other | Admitting: Urology

## 2020-12-27 ENCOUNTER — Encounter: Payer: Self-pay | Admitting: Urology

## 2020-12-27 ENCOUNTER — Other Ambulatory Visit: Payer: Self-pay

## 2020-12-27 ENCOUNTER — Ambulatory Visit (INDEPENDENT_AMBULATORY_CARE_PROVIDER_SITE_OTHER): Payer: Medicare Other | Admitting: Urology

## 2020-12-27 VITALS — BP 141/82 | HR 78 | Ht 62.0 in | Wt 191.0 lb

## 2020-12-27 DIAGNOSIS — N3941 Urge incontinence: Secondary | ICD-10-CM | POA: Diagnosis not present

## 2020-12-27 DIAGNOSIS — N2889 Other specified disorders of kidney and ureter: Secondary | ICD-10-CM

## 2020-12-27 MED ORDER — GEMTESA 75 MG PO TABS: 75.0000 mg | ORAL_TABLET | ORAL | 0 refills | Status: AC

## 2020-12-27 NOTE — Progress Notes (Signed)
12/27/2020 2:01 PM   Kristin Mitchell [birth year] [id number]  Referring provider: Toni Arthurs, NP [address] [place],  [place] [id number]  Chief Complaint  Patient presents with   Follow-up    MRI results    HPI: 80 year old female with a personal history of a renal mass who returns today for follow-up.  She also has history of OAB.  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    Given her age and comorbidities as well as absence of information about growth rate, she elected conservative management and follows up today with MRI.  Most recent MRI completed on 11/2020 shows no evidence of interval change or growth of the mass.  She denies any flank pain or gross hematuria.  In addition to above, she continues to struggle with overactive bladder and urge incontinence.  During the day, she gets to the bathroom in time but with significant urgency.  At nighttime, she has accidents.  She tried Myrbetriq 25 mg x 1 month after last visit which was not helpful.  She is interested in further pharmacotherapy given the severity of her symptoms.  She denies any dysuria or gross hematuria.   PMH: Past Medical History:  Diagnosis Date   Arthritis    Breast cancer ([place]) 11/26/2014   Cancer ([place])    breast   Intermittent palpitations    in early am    Surgical History: Past Surgical History:  Procedure Laterality Date   ABDOMINAL HYSTERECTOMY     CATARACT EXTRACTION W/PHACO Left 11/26/2014   Procedure: CATARACT EXTRACTION PHACO AND INTRAOCULAR LENS PLACEMENT ([place]);  Surgeon: Estill Cotta, MD;  Location: [facility] ORS;  Service: Ophthalmology;  Laterality: Left;  US:01:24 AP:26.6 CDE:40.16 PACk [id number] H   CHOLECYSTECTOMY     EYE SURGERY     MASTECTOMY     bilateral    Home Medications:  Allergies as of  12/27/2020       Reactions   Penicillins Rash        Medication List        Accurate as of December 27, 2020  2:01 PM. If you have any questions, ask your nurse or doctor.          STOP taking these medications    clindamycin 300 MG capsule Commonly known as: CLEOCIN Stopped by: Hollice Espy, MD       TAKE these medications    acetaminophen 500 MG tablet Commonly known as: TYLENOL Take 500 mg by mouth every 6 (six) hours as needed.   alendronate 70 MG tablet Commonly known as: FOSAMAX Take with a full glass of water on an empty stomach.   Calcium 600+D 600-800 MG-UNIT Tabs Generic drug: Calcium Carb-Cholecalciferol Take 1 tablet by mouth daily.   lovastatin 10 MG tablet Commonly known as: MEVACOR Take by mouth. What changed: Another medication with the same name was removed. Continue taking this medication, and follow the directions you see here. Changed by: Hollice Espy, MD   metoprolol succinate 25 MG 24 hr tablet Commonly known as: TOPROL-XL Take 25 mg by mouth daily.   sodium chloride 1 g tablet Take 1 g by mouth 2 (two) times daily.   vitamin B-12 1000 MCG tablet Commonly known as: CYANOCOBALAMIN Take 1,000 mcg by mouth daily.   Vitamin D3 50 MCG (2000 UT) Tabs Take  1 tablet by mouth daily.        Allergies:  Allergies  Allergen Reactions   Penicillins Rash    Family History: Family History  Problem Relation Age of Onset   Parkinson's disease Mother    Hodgkin's lymphoma Father    Prostate cancer Neg Hx    Bladder Cancer Neg Hx    Kidney cancer Neg Hx     Social History:  reports that she has never smoked. She has never used smokeless tobacco. She reports that she does not drink alcohol and does not use drugs.   Physical Exam: BP (!) 141/82   Pulse 78   Ht '5\' 2"'$  (1.575 m)   Wt 191 lb (86.6 kg)   BMI 34.93 kg/m   Constitutional:  Alert and oriented, No acute distress.  Using walker. HEENT: [place] AT, moist mucus membranes.   Trachea midline, no masses. Cardiovascular: No clubbing, cyanosis, or edema. Respiratory: Normal respiratory effort, no increased work of breathing. Skin: No rashes, bruises or suspicious lesions. Neurologic: Grossly intact, no focal deficits, moving all 4 extremities. Psychiatric: Normal mood and affect.  Laboratory Data: Lab Results  Component Value Date   WBC 5.3 11/20/2020   HGB 11.7 (L) 11/20/2020   HCT 34.6 (L) 11/20/2020   MCV 91.5 11/20/2020   PLT 162 11/20/2020    Lab Results  Component Value Date   CREATININE 1.06 (H) 11/20/2020    Pertinent Imaging: IMPRESSION: 1. No significant change in size of a heterogeneous exophytic mass of the lateral superior pole of the right kidney measuring 2.8 cm, which again demonstrates intrinsic T1 signal hyperintensity suggesting hemorrhagic or proteinaceous contents, although as on prior examination, does appear to distinctly demonstrate subtle areas of internal contrast enhancement. This remains consistent with a renal cell carcinoma without evidence of interval growth.   2. No evidence of renal vein invasion, lymphadenopathy, or metastatic disease in the abdomen.     Electronically Signed   By: Eddie Candle M.D.   On: 12/06/2020 11:28  MRI images were personally reviewed, agree with radiologic interpretation.  Compared to her previous exam, has minimal change.   Assessment & Plan:    1. Right renal mass No interval growth since 05/2020 which is very reassuring  Lesion likely represents an indolent RCC versus benign etiology.  We discussed various options and she would like to continue conservative management with observation which is very reasonable.  Will plan for an MRI again next year with and without contrast.  To return sooner if she develops any symptoms. - MR Abdomen W Wo Contrast; Future - MR Abdomen W Wo Contrast; Future   Abdomen W Wo Contrast; Future  3. Urge incontinence We discussed symptoms are  likely exacerbated by mobility issues.  We discussed behavioral modification with avoidance of beverages 4 hours before bedtime which she already does.  She was given samples of Gemtesa 75 mg to try for the next month.  She will let us know if this is effective.  If this fails, may consider alternative therapy such as Botox or PTNS with desire to avoid intake cholinergic based on her age and frailty.  F/u 1 year with MRI or sooner as needed   Hollice Espy, MD  [facility] [address], [place], [place] [address] [phone]

## 2021-03-05 ENCOUNTER — Emergency Department
Admission: EM | Admit: 2021-03-05 | Discharge: 2021-03-05 | Disposition: A | Discharge status: Home / Self Care | Payer: Medicare Other | Attending: Emergency Medicine | Admitting: Emergency Medicine

## 2021-03-05 ENCOUNTER — Emergency Department: Payer: Medicare Other

## 2021-03-05 ENCOUNTER — Other Ambulatory Visit: Payer: Self-pay

## 2021-03-05 DIAGNOSIS — Z853 Personal history of malignant neoplasm of breast: Secondary | ICD-10-CM | POA: Insufficient documentation

## 2021-03-05 DIAGNOSIS — X58XXXA Exposure to other specified factors, initial encounter: Secondary | ICD-10-CM | POA: Insufficient documentation

## 2021-03-05 DIAGNOSIS — Z79899 Other long term (current) drug therapy: Secondary | ICD-10-CM | POA: Insufficient documentation

## 2021-03-05 DIAGNOSIS — S4991XA Unspecified injury of right shoulder and upper arm, initial encounter: Secondary | ICD-10-CM | POA: Diagnosis present

## 2021-03-05 DIAGNOSIS — I10 Essential (primary) hypertension: Secondary | ICD-10-CM | POA: Insufficient documentation

## 2021-03-05 DIAGNOSIS — S43421A Sprain of right rotator cuff capsule, initial encounter: Secondary | ICD-10-CM | POA: Diagnosis not present

## 2021-03-05 MED ORDER — BUPIVACAINE HCL (PF) 0.5 % IJ SOLN
20.0000 mL | Freq: Once | INTRAMUSCULAR | Status: AC
  Administered 2021-03-05: 20 mL
  Filled 2021-03-05: qty 20

## 2021-03-05 MED ORDER — LIDOCAINE 5 % EX PTCH: 1.0000 | MEDICATED_PATCH | Freq: Two times a day (BID) | CUTANEOUS | 0 refills | Status: AC

## 2021-03-05 MED ORDER — TRIAMCINOLONE ACETONIDE 40 MG/ML IJ SUSP
40.0000 mg | Freq: Once | INTRAMUSCULAR | Status: AC
  Administered 2021-03-05: 40 mg via INTRA_ARTICULAR
  Filled 2021-03-05: qty 1

## 2021-03-05 MED ORDER — MELOXICAM 7.5 MG PO TABS
7.5000 mg | ORAL_TABLET | Freq: Once | ORAL | Status: AC
  Administered 2021-03-05: 7.5 mg via ORAL
  Filled 2021-03-05: qty 1

## 2021-03-05 MED ORDER — MELOXICAM 7.5 MG PO TABS: 7.5000 mg | ORAL_TABLET | Freq: Every day | ORAL | 0 refills | Status: AC

## 2021-03-05 NOTE — ED Provider Notes (Signed)
[facility] Emergency Department Provider Note  ____________________________________________   Event Date/Time   First MD Initiated Contact with Patient 03/05/21 1156     (approximate)  I have reviewed the triage vital signs and the nursing notes.   HISTORY  Chief Complaint Arm Pain    HPI Kristin Mitchell is a 80 y.o. female  with PMHx HTN, HLD, here with R shoulder pain. Pt reports that she has had one day or so of acute, pinpoint, R posterior shoulder pain. She denies injury ro falls but did sew her husband's pants yesterday, prior to the onset of pain. Pain is pinpoint, not moving, and reproducibel with palpation. It is worse with external rotation and flexion fo the arm at the shoulder. No rash. No open wounds. No anterior chst or arm pain. No numbness, weakness. No neck pain.        Past Medical History:  Diagnosis Date   Arthritis    Breast cancer ([place]) 11/26/2014   Cancer ([place])    breast   Intermittent palpitations    in early am    Patient Active Problem List   Diagnosis Date Noted   Adnexal cyst 03/06/2020   Right renal mass 02/20/2020   Osteopenia of spine 02/20/2020   Goals of care, counseling/discussion 02/20/2020   Normocytic anemia 12/07/2019   Hyponatremia 12/07/2019   Pain in limb 10/10/2019   Osteoporosis without current pathological fracture 11/25/2018   Gonalgia 06/10/2015   Arthritis of knee, degenerative 06/10/2015   Essential (primary) hypertension 05/10/2015   Breast cancer of upper-outer quadrant of right female breast ([place]) 11/26/2014   Primary osteoarthritis of both knees 05/01/2014   H/O bilateral mastectomy 05/01/2014   H/O malignant neoplasm of breast 05/01/2014   Hypercholesteremia 05/01/2014   Hypercholesterolemia 05/01/2014    Past Surgical History:  Procedure Laterality Date   ABDOMINAL HYSTERECTOMY     CATARACT EXTRACTION W/PHACO Left 11/26/2014   Procedure: CATARACT EXTRACTION PHACO AND  INTRAOCULAR LENS PLACEMENT ([place]);  Surgeon: Estill Cotta, MD;  Location: [facility] ORS;  Service: Ophthalmology;  Laterality: Left;  US:01:24 AP:26.6 CDE:40.16 PACk [id number]   CHOLECYSTECTOMY     EYE SURGERY     MASTECTOMY     bilateral    Prior to Admission medications   Medication Sig Start Date End Date Taking? Authorizing Provider  lidocaine (LIDODERM) 5 % Place 1 patch onto the skin every 12 (twelve) hours for 7 days. Remove & Discard patch within 12 hours or as directed by MD 03/05/21 03/12/21 Yes Duffy Bruce, MD  meloxicam (MOBIC) 7.5 MG tablet Take 1 tablet (7.5 mg total) by mouth daily for 7 doses. 03/05/21 03/12/21 Yes Duffy Bruce, MD  acetaminophen (TYLENOL) 500 MG tablet Take 500 mg by mouth every 6 (six) hours as needed.     [provider]  alendronate (FOSAMAX) 70 MG tablet Take with a full glass of water on an empty stomach. 07/02/20   Lequita Asal, MD  Calcium Carb-Cholecalciferol (CALCIUM 600+D) 600-800 MG-UNIT TABS Take 1 tablet by mouth daily.    [provider]  Cholecalciferol (VITAMIN D3) 50 MCG (2000 UT) TABS Take 1 tablet by mouth daily.    [provider]  lovastatin (MEVACOR) 10 MG tablet Take by mouth. 09/24/20   [provider]  metoprolol succinate (TOPROL-XL) 25 MG 24 hr tablet Take 25 mg by mouth daily.     [provider]  sodium chloride 1 g tablet Take 1 g by mouth 2 (two) times  daily. 10/30/20   [provider]  vitamin B-12 (CYANOCOBALAMIN) 1000 MCG tablet Take 1,000 mcg by mouth daily.    [provider]    Allergies Penicillins  Family History  Problem Relation Age of Onset   Parkinson's disease Mother    Hodgkin's lymphoma Father    Prostate cancer Neg Hx    Bladder Cancer Neg Hx    Kidney cancer Neg Hx     Social History Social History   Tobacco Use   Smoking status: Never   Smokeless tobacco: Never  Substance Use Topics   Alcohol use: No   Drug use: Never     Review of Systems  Review of Systems  Constitutional:  Negative for chills and fever.  HENT:  Negative for sore throat.   Respiratory:  Negative for shortness of breath.   Cardiovascular:  Negative for chest pain.  Gastrointestinal:  Negative for abdominal pain.  Genitourinary:  Negative for flank pain.  Musculoskeletal:  Positive for arthralgias. Negative for neck pain.  Skin:  Negative for rash and wound.  Allergic/Immunologic: Negative for immunocompromised state.  Neurological:  Negative for weakness and numbness.  Hematological:  Does not bruise/bleed easily.    ____________________________________________  PHYSICAL EXAM:      VITAL SIGNS: ED Triage Vitals  Enc Vitals Group     BP 03/05/21 1132 130/64     Pulse Rate 03/05/21 1132 76     Resp 03/05/21 1132 18     Temp 03/05/21 1132 97.9 F (36.6 C)     Temp Source 03/05/21 1132 Oral     SpO2 03/05/21 1132 100 %     Weight 03/05/21 1133 191 lb (86.6 kg)     Height 03/05/21 1133 5\' 2"  (1.575 m)     Head Circumference --      Peak Flow --      Pain Score 03/05/21 1133 10     Pain Loc --      Pain Edu? --      Excl. in [place]? --      Physical Exam Vitals and nursing note reviewed.  Constitutional:      General: She is not in acute distress.    Appearance: She is well-developed.  HENT:     Head: Normocephalic and atraumatic.  Eyes:     Conjunctiva/sclera: Conjunctivae normal.  Cardiovascular:     Rate and Rhythm: Normal rate and regular rhythm.     Heart sounds: Normal heart sounds.  Pulmonary:     Effort: Pulmonary effort is normal. No respiratory distress.     Breath sounds: No wheezing.  Abdominal:     General: There is no distension.  Musculoskeletal:       Arms:     Cervical back: Neck supple.     Comments: Pinpoint TTP over posterior aspect of GH joint, along area of rotator cuff tendon insertions, with exquisite pain with external rotation of humerus at [facility] joint, as well as flexion.   Skin:     General: Skin is warm.     Capillary Refill: Capillary refill takes less than 2 seconds.     Findings: No rash.  Neurological:     Mental Status: She is alert and oriented to person, place, and time.     Motor: No abnormal muscle tone.     Comments: Strength 5/5 bl UE. Normal sensation to light touch.      ____________________________________________   LABS (all labs ordered are listed, but only abnormal results  are displayed)  Labs Reviewed - No data to display  ____________________________________________  EKG:  ________________________________________  RADIOLOGY All imaging, including plain films, CT scans, and ultrasounds, independently reviewed by me, and interpretations confirmed via formal radiology reads.  ED MD interpretation:   DG Shoulder: No acute fx  Official radiology report(s): DG Shoulder Right  Result Date: 03/05/2021 CLINICAL DATA:  Right shoulder pain EXAM: RIGHT SHOULDER - 2+ VIEW COMPARISON:  None. FINDINGS: Normal alignment. No acute fracture. Mild degenerative changes of the right shoulder. Surgical clips in the right axilla. IMPRESSION: Mild degenerative changes of the left shoulder. No malalignment or acute fracture. Electronically Signed   By: Albin Felling M.D.   On: 03/05/2021 14:00    ____________________________________________  PROCEDURES   Procedure(s) performed (including Critical Care):  Injection tendon or ligament  Date/Time: 03/05/2021 3:43 PM Performed by: Duffy Bruce, MD Authorized by: Duffy Bruce, MD  Consent: Verbal consent obtained. Consent given by: patient Required items: required blood products, implants, devices, and special equipment available Patient identity confirmed: verbally with patient Time out: Immediately prior to procedure a "time out" was called to verify the correct patient, procedure, equipment, support staff and site/side marked as required. Preparation: Patient was prepped and draped in the usual  sterile fashion. Local anesthesia used: yes Anesthesia: local infiltration  Anesthesia: Local anesthesia used: yes Local Anesthetic: bupivacaine 0.5% without epinephrine Anesthetic total: 2 mL Patient tolerance: patient tolerated the procedure well with no immediate complications Comments: 1 cc of Kenalog and 2 cc of Bupivicaine injected directly into trigger area of pain along tendons of insertion of posterior rotator cuff/scapular muscles.    ____________________________________________  INITIAL IMPRESSION / MDM / ASSESSMENT AND PLAN / ED COURSE  As part of my medical decision making, I reviewed the following data within the [facility] notes reviewed and incorporated, Old chart reviewed, Notes from prior ED visits, and [place] Controlled Substance Database       *Aniylah Anwar Crill was evaluated in Emergency Department on 03/05/2021 for the symptoms described in the history of present illness. She was evaluated in the context of the global COVID-19 pandemic, which necessitated consideration that the patient might be at risk for infection with the SARS-CoV-2 virus that causes COVID-19. Institutional protocols and algorithms that pertain to the evaluation of patients at risk for COVID-19 are in a state of rapid change based on information released by regulatory bodies including the CDC and federal and state organizations. These policies and algorithms were followed during the patient's care in the ED.  Some ED evaluations and interventions may be delayed as a result of limited staffing during the pandemic.*     Medical Decision Making:  80 yo RHD female here with pinpoint, reproducible tenderness along posterior GH joint/rotator cuff, with pain upon external rotation. Sx, exam is hightly consistent with rotator cuff tendonitis. H/o arthritis of the shoulder and pt apparently spent a significant amount of time sewing her husband's pants yesterday, which I suspect  triggered it. Following informed consent discussion, trigger point injection performed given the degree of pain and functional impairment. Tolerated well with improvement. Will d/c with mobic (normal renal function), topical lido, ortho f/u. Return precautions given. No anterior chest wall or shoulder pain, no sx to suggest referred cardiac or other pain.  ____________________________________________  FINAL CLINICAL IMPRESSION(S) / ED DIAGNOSES  Final diagnoses:  Sprain of right rotator cuff capsule, initial encounter     MEDICATIONS GIVEN DURING THIS VISIT:  Medications  bupivacaine (MARCAINE)  0.5 % injection 20 mL (20 mLs Infiltration Given by Other 03/05/21 1255)  triamcinolone acetonide (KENALOG-40) injection 40 mg (40 mg Intra-articular Given by Other 03/05/21 1256)  meloxicam (MOBIC) tablet 7.5 mg (7.5 mg Oral Given 03/05/21 1445)     ED Discharge Orders          Ordered    meloxicam (MOBIC) 7.5 MG tablet  Daily        03/05/21 1439    lidocaine (LIDODERM) 5 %  Every 12 hours        03/05/21 1439             Note:  This document was prepared using Dragon voice recognition software and may include unintentional dictation errors.   Duffy Bruce, MD 03/05/21 [phone]

## 2021-03-05 NOTE — ED Triage Notes (Addendum)
Pt states right shoulder pain starting yesterday. Unsure of cause-no falls or injuries that she knows of. Cannot raise it above her head due to pain. No weakness, SOB, CP. Pt did do some sewing and light yard work before it started.

## 2021-03-05 NOTE — Discharge Instructions (Signed)
For your shoulder:  Wear the sling when upright for the next 5-7 days.  Take Tylenol 1000 mg every 6 hours for the next 2-3 days, then as needed Take the Mobic daily with food in the mornings Apply lidoderm patches over the area of pain daily for 7 days  Call your primary to arrange follow-up in the next 1 week, with your primary doctor.

## 2021-03-19 ENCOUNTER — Other Ambulatory Visit: Payer: Self-pay | Admitting: Orthopedic Surgery

## 2021-03-19 DIAGNOSIS — S46001A Unspecified injury of muscle(s) and tendon(s) of the rotator cuff of right shoulder, initial encounter: Secondary | ICD-10-CM

## 2021-03-19 DIAGNOSIS — M25311 Other instability, right shoulder: Secondary | ICD-10-CM

## 2021-03-27 DIAGNOSIS — R0609 Other forms of dyspnea: Secondary | ICD-10-CM | POA: Insufficient documentation

## 2021-03-29 ENCOUNTER — Ambulatory Visit
Admission: RE | Admit: 2021-03-29 | Discharge: 2021-03-29 | Disposition: A | Discharge status: Home / Self Care | Payer: Medicare Other | Source: Ambulatory Visit | Attending: Orthopedic Surgery | Admitting: Orthopedic Surgery

## 2021-03-29 ENCOUNTER — Ambulatory Visit: Payer: Medicare Other

## 2021-03-29 ENCOUNTER — Other Ambulatory Visit: Payer: Self-pay

## 2021-03-29 DIAGNOSIS — M25311 Other instability, right shoulder: Secondary | ICD-10-CM | POA: Insufficient documentation

## 2021-03-29 DIAGNOSIS — S46001A Unspecified injury of muscle(s) and tendon(s) of the rotator cuff of right shoulder, initial encounter: Secondary | ICD-10-CM | POA: Diagnosis present

## 2021-04-22 ENCOUNTER — Other Ambulatory Visit
Admission: RE | Admit: 2021-04-22 | Discharge: 2021-04-22 | Disposition: A | Discharge status: Home / Self Care | Payer: Medicare Other | Source: Ambulatory Visit | Attending: Orthopedic Surgery | Admitting: Orthopedic Surgery

## 2021-04-22 DIAGNOSIS — M25311 Other instability, right shoulder: Secondary | ICD-10-CM | POA: Insufficient documentation

## 2021-04-22 LAB — SYNOVIAL CELL COUNT + DIFF, W/ CRYSTALS
Crystals, Fluid: NONE SEEN
Eosinophils-Synovial: 0 %
Lymphocytes-Synovial Fld: 29 %
Monocyte-Macrophage-Synovial Fluid: 69 %
Neutrophil, Synovial: 2 %
Other Cells-SYN: 0
WBC, Synovial: 158 /mm3 (ref 0–200)

## 2021-12-19 ENCOUNTER — Ambulatory Visit
Admission: RE | Admit: 2021-12-19 | Discharge: 2021-12-19 | Disposition: A | Discharge status: Home / Self Care | Payer: Medicare Other | Source: Ambulatory Visit | Attending: Urology | Admitting: Urology

## 2021-12-19 DIAGNOSIS — N2889 Other specified disorders of kidney and ureter: Secondary | ICD-10-CM | POA: Diagnosis present

## 2021-12-19 MED ORDER — GADOBUTROL 1 MMOL/ML IV SOLN
7.5000 mL | Freq: Once | INTRAVENOUS | Status: AC | PRN
  Administered 2021-12-19: 7.5 mL via INTRAVENOUS

## 2021-12-26 ENCOUNTER — Ambulatory Visit: Payer: Medicare Other | Admitting: Urology

## 2021-12-28 ENCOUNTER — Emergency Department: Payer: Medicare Other

## 2021-12-28 ENCOUNTER — Observation Stay: Payer: Medicare Other

## 2021-12-28 ENCOUNTER — Observation Stay
Admission: EM | Admit: 2021-12-28 | Discharge: 2021-12-30 | Disposition: A | Discharge status: Home / Self Care | Payer: Medicare Other | Attending: Internal Medicine | Admitting: Internal Medicine

## 2021-12-28 ENCOUNTER — Other Ambulatory Visit: Payer: Self-pay

## 2021-12-28 DIAGNOSIS — E7800 Pure hypercholesterolemia, unspecified: Secondary | ICD-10-CM | POA: Diagnosis present

## 2021-12-28 DIAGNOSIS — M1990 Unspecified osteoarthritis, unspecified site: Secondary | ICD-10-CM | POA: Insufficient documentation

## 2021-12-28 DIAGNOSIS — N2889 Other specified disorders of kidney and ureter: Secondary | ICD-10-CM | POA: Diagnosis present

## 2021-12-28 DIAGNOSIS — R791 Abnormal coagulation profile: Secondary | ICD-10-CM

## 2021-12-28 DIAGNOSIS — D649 Anemia, unspecified: Secondary | ICD-10-CM | POA: Insufficient documentation

## 2021-12-28 DIAGNOSIS — I129 Hypertensive chronic kidney disease with stage 1 through stage 4 chronic kidney disease, or unspecified chronic kidney disease: Secondary | ICD-10-CM | POA: Insufficient documentation

## 2021-12-28 DIAGNOSIS — E871 Hypo-osmolality and hyponatremia: Secondary | ICD-10-CM | POA: Insufficient documentation

## 2021-12-28 DIAGNOSIS — R531 Weakness: Secondary | ICD-10-CM | POA: Insufficient documentation

## 2021-12-28 DIAGNOSIS — E785 Hyperlipidemia, unspecified: Secondary | ICD-10-CM | POA: Insufficient documentation

## 2021-12-28 DIAGNOSIS — N183 Chronic kidney disease, stage 3 unspecified: Secondary | ICD-10-CM

## 2021-12-28 DIAGNOSIS — R2681 Unsteadiness on feet: Secondary | ICD-10-CM | POA: Insufficient documentation

## 2021-12-28 DIAGNOSIS — N281 Cyst of kidney, acquired: Secondary | ICD-10-CM | POA: Insufficient documentation

## 2021-12-28 DIAGNOSIS — I480 Paroxysmal atrial fibrillation: Secondary | ICD-10-CM | POA: Diagnosis not present

## 2021-12-28 DIAGNOSIS — Z7901 Long term (current) use of anticoagulants: Secondary | ICD-10-CM | POA: Diagnosis not present

## 2021-12-28 DIAGNOSIS — Z79899 Other long term (current) drug therapy: Secondary | ICD-10-CM | POA: Diagnosis not present

## 2021-12-28 DIAGNOSIS — N182 Chronic kidney disease, stage 2 (mild): Secondary | ICD-10-CM | POA: Diagnosis not present

## 2021-12-28 DIAGNOSIS — R269 Unspecified abnormalities of gait and mobility: Secondary | ICD-10-CM | POA: Diagnosis not present

## 2021-12-28 DIAGNOSIS — I493 Ventricular premature depolarization: Secondary | ICD-10-CM | POA: Diagnosis not present

## 2021-12-28 DIAGNOSIS — R29810 Facial weakness: Secondary | ICD-10-CM

## 2021-12-28 DIAGNOSIS — C50411 Malignant neoplasm of upper-outer quadrant of right female breast: Secondary | ICD-10-CM | POA: Diagnosis present

## 2021-12-28 DIAGNOSIS — R4781 Slurred speech: Secondary | ICD-10-CM

## 2021-12-28 DIAGNOSIS — I10 Essential (primary) hypertension: Secondary | ICD-10-CM

## 2021-12-28 DIAGNOSIS — G459 Transient cerebral ischemic attack, unspecified: Principal | ICD-10-CM

## 2021-12-28 DIAGNOSIS — Z853 Personal history of malignant neoplasm of breast: Secondary | ICD-10-CM | POA: Diagnosis not present

## 2021-12-28 HISTORY — DX: Unspecified atrial fibrillation: I48.91

## 2021-12-28 LAB — URINALYSIS, ROUTINE W REFLEX MICROSCOPIC
Bilirubin Urine: NEGATIVE
Glucose, UA: NEGATIVE mg/dL
Hgb urine dipstick: NEGATIVE
Ketones, ur: NEGATIVE mg/dL
Leukocytes,Ua: NEGATIVE
Nitrite: NEGATIVE
Protein, ur: NEGATIVE mg/dL
Specific Gravity, Urine: 1.006 (ref 1.005–1.030)
pH: 6 (ref 5.0–8.0)

## 2021-12-28 LAB — ETHANOL: Alcohol, Ethyl (B): <10 mg/dL (ref <10)

## 2021-12-28 LAB — COMPREHENSIVE METABOLIC PANEL
ALT: 13 U/L (ref 0–44)
AST: 23 U/L (ref 15–41)
Albumin: 3.5 g/dL (ref 3.5–5.0)
Alkaline Phosphatase: 91 U/L (ref 38–126)
Anion gap: 9 (ref 5–15)
BUN: 21 mg/dL (ref 8–23)
CO2: 26 mmol/L (ref 22–32)
Calcium: 9 mg/dL (ref 8.9–10.3)
Chloride: 95 mmol/L — ABNORMAL LOW (ref 98–111)
Creatinine, Ser: 1.08 mg/dL — ABNORMAL HIGH (ref 0.44–1.00)
GFR, Estimated: 52 mL/min — ABNORMAL LOW (ref >60)
Glucose, Bld: 104 mg/dL — ABNORMAL HIGH (ref 70–99)
Potassium: 3.9 mmol/L (ref 3.5–5.1)
Sodium: 130 mmol/L — ABNORMAL LOW (ref 135–145)
Total Bilirubin: 0.7 mg/dL (ref 0.3–1.2)
Total Protein: 6.9 g/dL (ref 6.5–8.1)

## 2021-12-28 LAB — CBC
HCT: 32.8 % — ABNORMAL LOW (ref 36.0–46.0)
Hemoglobin: 10.6 g/dL — ABNORMAL LOW (ref 12.0–15.0)
MCH: 30.6 pg (ref 26.0–34.0)
MCHC: 32.3 g/dL (ref 30.0–36.0)
MCV: 94.8 fL (ref 80.0–100.0)
Platelets: 191 10*3/uL (ref 150–400)
RBC: 3.46 MIL/uL — ABNORMAL LOW (ref 3.87–5.11)
RDW: 12.8 % (ref 11.5–15.5)
WBC: 5.6 10*3/uL (ref 4.0–10.5)
nRBC: 0 % (ref 0.0–0.2)

## 2021-12-28 LAB — HEMOGLOBIN A1C
Hgb A1c MFr Bld: 5.2 % (ref 4.8–5.6)
Mean Plasma Glucose: 102.54 mg/dL

## 2021-12-28 LAB — PROTIME-INR
INR: 1.3 — ABNORMAL HIGH (ref 0.8–1.2)
Prothrombin Time: 15.9 seconds — ABNORMAL HIGH (ref 11.4–15.2)

## 2021-12-28 LAB — URINE DRUG SCREEN, QUALITATIVE (ARMC ONLY)
Amphetamines, Ur Screen: NOT DETECTED
Barbiturates, Ur Screen: NOT DETECTED
Benzodiazepine, Ur Scrn: NOT DETECTED
Cannabinoid 50 Ng, Ur ~~LOC~~: NOT DETECTED
Cocaine Metabolite,Ur ~~LOC~~: NOT DETECTED
MDMA (Ecstasy)Ur Screen: NOT DETECTED
Methadone Scn, Ur: NOT DETECTED
Opiate, Ur Screen: NOT DETECTED
Phencyclidine (PCP) Ur S: NOT DETECTED
Tricyclic, Ur Screen: NOT DETECTED

## 2021-12-28 MED ORDER — STROKE: EARLY STAGES OF RECOVERY BOOK: Freq: Once | Status: AC

## 2021-12-28 MED ORDER — WARFARIN SODIUM 2.5 MG PO TABS
3.7500 mg | ORAL_TABLET | Freq: Once | ORAL | Status: AC
  Administered 2021-12-28: 3.75 mg via ORAL
  Filled 2021-12-28: qty 1

## 2021-12-28 MED ORDER — WARFARIN - PHARMACIST DOSING INPATIENT: Freq: Every day | Status: DC

## 2021-12-28 MED ORDER — ASPIRIN 300 MG RE SUPP
300.0000 mg | Freq: Every day | RECTAL | Status: DC
  Filled 2021-12-28 (×2): qty 1

## 2021-12-28 MED ORDER — ASPIRIN 325 MG PO TABS
325.0000 mg | ORAL_TABLET | Freq: Every day | ORAL | Status: DC
  Administered 2021-12-28 – 2021-12-29 (×2): 325 mg via ORAL
  Filled 2021-12-28 (×3): qty 1

## 2021-12-28 MED ORDER — SENNOSIDES-DOCUSATE SODIUM 8.6-50 MG PO TABS: 1.0000 | ORAL_TABLET | Freq: Every evening | ORAL | Status: DC | PRN

## 2021-12-28 MED ORDER — ACETAMINOPHEN 500 MG PO TABS: 500.0000 mg | ORAL_TABLET | Freq: Four times a day (QID) | ORAL | Status: DC | PRN

## 2021-12-28 MED ORDER — METOPROLOL TARTRATE 5 MG/5ML IV SOLN: 5.0000 mg | INTRAVENOUS | Status: DC | PRN

## 2021-12-28 MED ORDER — ORAL CARE MOUTH RINSE: 15.0000 mL | OROMUCOSAL | Status: DC | PRN

## 2021-12-28 MED ORDER — HYDRALAZINE HCL 20 MG/ML IJ SOLN: 5.0000 mg | Freq: Four times a day (QID) | INTRAMUSCULAR | Status: DC | PRN

## 2021-12-28 MED ORDER — SODIUM CHLORIDE 1 G PO TABS
1.0000 g | ORAL_TABLET | Freq: Two times a day (BID) | ORAL | Status: DC
  Administered 2021-12-28 – 2021-12-30 (×4): 1 g via ORAL
  Filled 2021-12-28 (×4): qty 1

## 2021-12-28 MED ORDER — SODIUM CHLORIDE 0.9 % IV SOLN
INTRAVENOUS | Status: DC
Start: 2021-12-28 — End: 2021-12-29

## 2021-12-28 MED ORDER — ATORVASTATIN CALCIUM 20 MG PO TABS
80.0000 mg | ORAL_TABLET | Freq: Every day | ORAL | Status: DC
Start: 2021-12-28 — End: 2021-12-30
  Administered 2021-12-28 – 2021-12-29 (×2): 80 mg via ORAL
  Filled 2021-12-28 (×2): qty 4

## 2021-12-28 MED ORDER — VITAMIN B-12 1000 MCG PO TABS
1000.0000 ug | ORAL_TABLET | Freq: Every day | ORAL | Status: DC
  Administered 2021-12-28 – 2021-12-30 (×3): 1000 ug via ORAL
  Filled 2021-12-28 (×3): qty 1

## 2021-12-28 NOTE — ED Notes (Signed)
Advised nurse that patient has ready bed 

## 2021-12-28 NOTE — Plan of Care (Signed)
  Problem: Education: Goal: Knowledge of General Education information will improve Description: Including pain rating scale, medication(s)/side effects and non-pharmacologic comfort measures Outcome: Progressing   Problem: Health Behavior/Discharge Planning: Goal: Ability to manage health-related needs will improve Outcome: Progressing   Problem: Clinical Measurements: Goal: Ability to maintain clinical measurements within normal limits will improve Outcome: Progressing Goal: Will remain free from infection Outcome: Progressing Goal: Diagnostic test results will improve Outcome: Progressing Goal: Respiratory complications will improve Outcome: Progressing Goal: Cardiovascular complication will be avoided Outcome: Progressing   Problem: Activity: Goal: Risk for activity intolerance will decrease Outcome: Progressing   Problem: Nutrition: Goal: Adequate nutrition will be maintained Outcome: Progressing   Problem: Coping: Goal: Level of anxiety will decrease Outcome: Progressing   Problem: Elimination: Goal: Will not experience complications related to bowel motility Outcome: Progressing Goal: Will not experience complications related to urinary retention Outcome: Progressing   Problem: Pain Managment: Goal: General experience of comfort will improve Outcome: Progressing   Problem: Safety: Goal: Ability to remain free from injury will improve Outcome: Progressing   Problem: Skin Integrity: Goal: Risk for impaired skin integrity will decrease Outcome: Progressing   Problem: Education: Goal: Knowledge of disease or condition will improve Outcome: Progressing Goal: Knowledge of secondary prevention will improve (SELECT ALL) Outcome: Progressing Goal: Knowledge of patient specific risk factors will improve (INDIVIDUALIZE FOR PATIENT) Outcome: Progressing   Problem: Coping: Goal: Will verbalize positive feelings about self Outcome: Progressing Goal: Will  identify appropriate support needs Outcome: Progressing   Problem: Health Behavior/Discharge Planning: Goal: Ability to manage health-related needs will improve Outcome: Progressing   Problem: Self-Care: Goal: Ability to participate in self-care as condition permits will improve Outcome: Progressing Goal: Verbalization of feelings and concerns over difficulty with self-care will improve Outcome: Progressing Goal: Ability to communicate needs accurately will improve Outcome: Progressing   Problem: Nutrition: Goal: Risk of aspiration will decrease Outcome: Progressing

## 2021-12-28 NOTE — Progress Notes (Signed)
ANTICOAGULATION CONSULT NOTE - Initial Consult  Pharmacy Consult for Warfarin  Indication: atrial fibrillation  Allergies  Allergen Reactions   Penicillins Rash    Patient Measurements: Height: '5\' 2"'$  (157.5 cm) Weight: 84.4 kg (186 lb) IBW/kg (Calculated) : 50.1 Heparin Dosing Weight:   Vital Signs: Temp: 98.2 F (36.8 C) (08/20 1945) Temp Source: Oral (08/20 1945) BP: 106/79 (08/20 1945) Pulse Rate: 75 (08/20 1945)  Labs: Recent Labs    12/28/21 1640  HGB 10.6*  HCT 32.8*  PLT 191  LABPROT 15.9*  INR 1.3*  CREATININE 1.08*    Estimated Creatinine Clearance: 41.1 mL/min (A) (by C-G formula based on SCr of 1.08 mg/dL (H)).   Medical History: Past Medical History:  Diagnosis Date   A-fib [facility])    Arthritis    Breast cancer ([place]) 11/26/2014   Cancer ([place])    breast   Intermittent palpitations    in early am    Medications:  Medications Prior to Admission  Medication Sig Dispense Refill Last Dose   acetaminophen (TYLENOL) 500 MG tablet Take 500 mg by mouth every 6 (six) hours as needed.       alendronate (FOSAMAX) 70 MG tablet Take with a full glass of water on an empty stomach. 12 tablet 0    Calcium Carb-Cholecalciferol (CALCIUM 600+D) 600-800 MG-UNIT TABS Take 1 tablet by mouth daily.      Cholecalciferol (VITAMIN D3) 50 MCG (2000 UT) TABS Take 1 tablet by mouth daily.      lovastatin (MEVACOR) 10 MG tablet Take by mouth.      metoprolol succinate (TOPROL-XL) 25 MG 24 hr tablet Take 25 mg by mouth daily.       sodium chloride 1 g tablet Take 1 g by mouth 2 (two) times daily.      vitamin B-12 (CYANOCOBALAMIN) 1000 MCG tablet Take 1,000 mcg by mouth daily.      warfarin (COUMADIN) 5 MG tablet Take 5 mg by mouth daily.       Assessment: Pharmacy consulted to dose warfarin in this 81 year old female admitted with possible stroke/TIA.   Pt was on warfarin 5 mg PO daily which resulted in INR of ~ 4.3. Pt instructed to hold warfarin on 8/14 and then restart  on 8/17 with dose of 2.5 mg PO daily.  Pt reports restarted warfarin 2.5 mg PO daily on 8/17, last dose on 8/19. 8/20:  INR @ 16:40 = 1.3 , SUBtherapeutic   Goal of Therapy:  INR 2-3    Plan:  Will order warfarin 3.75 mg X 1 (1.5 mg home dose) for 8/20 @ ~ 2230.  Will recheck INR on 8/21 with AM labs.   Lorilynn Lehr D 12/28/2021,10:34 PM

## 2021-12-28 NOTE — ED Triage Notes (Signed)
Pt had slurred and garbled speech and L sided facial droop that started at 1300 and only lasted a couple minutes- pt has normal speech now, no visual changes, no facial droop noted- stroke screen negative- pt does take warfarin with last dose last night

## 2021-12-28 NOTE — ED Provider Notes (Signed)
[facility] Provider Note   Event Date/Time   First MD Initiated Contact with Patient 12/28/21 1630     (approximate) History  Facial Droop  HPI Kristin Mitchell is a 81 y.o. female with a stated past medical history of atrial fibrillation on warfarin and in remission from breast cancer who presents via EMS for a 2-minute episode of slurred speech and left-sided facial droop that occurred at approximately 1300 today and resolved prior to EMS arrival.  During transport, EMS did not notice any recurrence of patient's symptoms.  Patient states that she had not had any similar symptoms since this incident at 1300.  Patient does however state that she has had similar symptoms in the past approximately 2 years ago that also resolved spontaneously and she has never had any work-up for. ROS: Patient currently denies any vision changes, tinnitus, difficulty speaking, facial droop, sore throat, chest pain, shortness of breath, abdominal pain, nausea/vomiting/diarrhea, dysuria, or weakness/numbness/paresthesias in any extremity   Physical Exam  Triage Vital Signs: ED Triage Vitals [12/28/21 1630]  Enc Vitals Group     BP      Pulse      Resp      Temp      Temp src      SpO2      Weight 186 lb (84.4 kg)     Height '5\' 2"'$  (1.575 m)     Head Circumference      Peak Flow      Pain Score 0     Pain Loc      Pain Edu?      Excl. in [place]?    Most recent vital signs: Vitals:   12/28/21 1915 12/28/21 1915  BP:  (!) 179/68  Pulse:  69  Resp:  18  Temp:  97.9 F (36.6 C)  SpO2: 96% 100%   General: Awake, oriented x4. CV:  Good peripheral perfusion.  Resp:  Normal effort.  Abd:  No distention.  Other:  Elderly overweight Caucasian female laying in bed in no acute distress.  NIHSS is 0 ED Results / Procedures / Treatments  Labs (all labs ordered are listed, but only abnormal results are displayed) Labs Reviewed  CBC - Abnormal; Notable for the following  components:      Result Value   RBC 3.46 (*)    Hemoglobin 10.6 (*)    HCT 32.8 (*)    All other components within normal limits  COMPREHENSIVE METABOLIC PANEL - Abnormal; Notable for the following components:   Sodium 130 (*)    Chloride 95 (*)    Glucose, Bld 104 (*)    Creatinine, Ser 1.08 (*)    GFR, Estimated 52 (*)    All other components within normal limits  PROTIME-INR - Abnormal; Notable for the following components:   Prothrombin Time 15.9 (*)    INR 1.3 (*)    All other components within normal limits  URINALYSIS, ROUTINE W REFLEX MICROSCOPIC - Abnormal; Notable for the following components:   Color, Urine STRAW (*)    APPearance CLEAR (*)    All other components within normal limits  URINE DRUG SCREEN, QUALITATIVE ([facility] ONLY)  ETHANOL   EKG ED ECG REPORT I, Naaman Plummer, the attending physician, personally viewed and interpreted this ECG. Date: 12/28/2021 EKG Time: 1642 Rate: 69 Rhythm: normal sinus rhythm QRS Axis: normal Intervals: normal ST/T Wave abnormalities: normal Narrative Interpretation: no evidence of acute ischemia RADIOLOGY ED MD interpretation:  CT of the head without contrast interpreted by me shows no evidence of acute abnormalities including no intracerebral hemorrhage, obvious masses, or significant edema -Agree with radiology assessment Official radiology report(s): CT HEAD WO CONTRAST  Result Date: 12/28/2021 CLINICAL DATA:  Pt had slurred and garbled speech and L sided facial droop that started at 1300 and only lasted a couple minutes- pt has normal speech now, no visual changes, no facial droop noted- stroke screen negative- pt does take warfarin with last dose last night EXAM: CT HEAD WITHOUT CONTRAST TECHNIQUE: Contiguous axial images were obtained from the base of the skull through the vertex without intravenous contrast. RADIATION DOSE REDUCTION: This exam was performed according to the departmental dose-optimization program which  includes automated exposure control, adjustment of the mA and/or kV according to patient size and/or use of iterative reconstruction technique. COMPARISON:  None Available. FINDINGS: Brain: No evidence of acute infarction, hemorrhage, hydrocephalus, extra-axial collection or mass lesion/mass effect. Area of hypoattenuation lies along the anterior/inferior and lateral aspect of the right thalamus, suspected to be due to chronic ischemic change. Vascular: No hyperdense vessel or unexpected calcification. Skull: Normal. Negative for fracture or focal lesion. Sinuses/Orbits: Globes and orbits are unremarkable. Visualized sinuses are clear. Other: None. IMPRESSION: 1. No acute intracranial abnormalities. Electronically Signed   By: Lajean Manes M.D.   On: 12/28/2021 17:55   PROCEDURES: Critical Care performed: Yes, see critical care procedure note(s) .1-3 Lead EKG Interpretation  Performed by: Naaman Plummer, MD Authorized by: Naaman Plummer, MD     Interpretation: normal     ECG rate:  74   ECG rate assessment: normal     Rhythm: sinus rhythm     Ectopy: none     Conduction: normal   CRITICAL CARE Performed by: Naaman Plummer  Total critical care time: 31 minutes  Critical care time was exclusive of separately billable procedures and treating other patients.  Critical care was necessary to treat or prevent imminent or life-threatening deterioration.  Critical care was time spent personally by me on the following activities: development of treatment plan with patient and/or surrogate as well as nursing, discussions with consultants, evaluation of patient's response to treatment, examination of patient, obtaining history from patient or surrogate, ordering and performing treatments and interventions, ordering and review of laboratory studies, ordering and review of radiographic studies, pulse oximetry and re-evaluation of patient's condition.  MEDICATIONS ORDERED IN ED: Medications   atorvastatin (LIPITOR) tablet 80 mg (has no administration in time range)  hydrALAZINE (APRESOLINE) injection 5 mg (has no administration in time range)   IMPRESSION / MDM / ASSESSMENT AND PLAN / ED COURSE  I reviewed the triage vital signs and the nursing notes.                             The patient is on the cardiac monitor to evaluate for evidence of arrhythmia and/or significant heart rate changes. Patient's presentation is most consistent with acute presentation with potential threat to life or bodily function. Patient is a 81 year old female who presents with symptoms concerning for TIA PMH risk factors: Atrial fibrillation Neurologic Deficits: Slurred speech, left-sided facial droop Last known Well Time: 1300 NIH Stroke Score: 0 Given History and Exam I have lower suspicion for infectious etiology, neurologic changes secondary to toxicologic ingestion, seizure, complex migraine. Presentation concerning for possible stroke requiring workup.  Workup: Labs: POC glucose, CBC, BMP, LFTs, Troponin, PT/INR, PTT, Type  and Screen Other Diagnostics: ECG, CXR, non-contrast head CT followed by CTA brain and neck (to r/o large vessel occlusion amenable to thrombectomy) Interventions: Patient's not eligible for TPA due to resolution of symptoms prior to evaluation  Consult: hospitalist Disposition: Admit   FINAL CLINICAL IMPRESSION(S) / ED DIAGNOSES   Final diagnoses:  Slurred speech  Facial droop  TIA (transient ischemic attack)   Rx / DC Orders   ED Discharge Orders     None      Note:  This document was prepared using Dragon voice recognition software and may include unintentional dictation errors.   Naaman Plummer, MD 12/28/21 [phone]

## 2021-12-28 NOTE — Progress Notes (Signed)
       CROSS COVER NOTE  NAME: Ankita Newcomer MRN: [id number] DOB : [birth year]    Date of Service   12/28/21  HPI/Events of Note   Notified by nursing staff that M(r)s Andres Labrum passed [employer] Screen.  Interventions   Plan: Heart Healthy Diet ordered     This document was prepared using Dragon voice recognition software and may include unintentional dictation errors.  Neomia Glass DNP, MHA, FNP-BC Nurse Practitioner [facility] Pager [phone]

## 2021-12-28 NOTE — H&P (Addendum)
History and Physical    Patient: Kristin Mitchell WGY:[id number] DOB: 30-May-1940 DOA: 12/28/2021 DOS: the patient was seen and examined on 12/28/2021 PCP: Latanya Maudlin, NP  Patient coming from: Home  Chief Complaint:  Chief Complaint  Patient presents with   Facial Droop   HPI: Kristin Mitchell is a 81 y.o. female with medical history significant of paroxysmal afib , recently started on coumadin, hypertension, hyperlipidemia, arthritis and history of breast cancer.  She is right-handed and at baseline she is ADL and IADL independent.  She presented to the emergency room on account of 2 episodes of difficulty with words finding and facial droop.  Symptoms occurred earlier today at home.  As per patient, she was sleeping but suddenly woke up with difficulty with words finding.  Episodes were witnessed by her husband who describes that patient opening her mouth and blabbing. He also described ? Facial droop.No witnessed seizure like activity.  Symptoms lasted about 1 min and resolved.  She went back to sleep and had recurrence of her symptoms.  She denies any associated focal extremity weakness or numbness.  She reports having had similar episodes in the past.  No prior history of stroke.  No family history of stroke or TIA.  On presentation NIHSS was scored as 0. Patient was deemed not a candidate for tPA.  Review of Systems: As mentioned in the history of present illness. All other systems reviewed and are negative. Past Medical History:  Diagnosis Date   A-fib [facility])    Arthritis    Breast cancer ([place]) 11/26/2014   Cancer ([place])    breast   Intermittent palpitations    in early am   Past Surgical History:  Procedure Laterality Date   ABDOMINAL HYSTERECTOMY     CATARACT EXTRACTION W/PHACO Left 11/26/2014   Procedure: CATARACT EXTRACTION PHACO AND INTRAOCULAR LENS PLACEMENT ([place]);  Surgeon: Estill Cotta, MD;  Location: [facility] ORS;  Service: Ophthalmology;  Laterality: Left;   US:01:24 AP:26.6 CDE:40.16 PACk [id number] H   CHOLECYSTECTOMY     EYE SURGERY     MASTECTOMY     bilateral   Social History:  reports that she has never smoked. She has never used smokeless tobacco. She reports that she does not drink alcohol and does not use drugs.  Allergies  Allergen Reactions   Penicillins Rash    Family History  Problem Relation Age of Onset   Parkinson's disease Mother    Hodgkin's lymphoma Father    Prostate cancer Neg Hx    Bladder Cancer Neg Hx    Kidney cancer Neg Hx     Prior to Admission medications   Medication Sig Start Date End Date Taking? Authorizing Provider  acetaminophen (TYLENOL) 500 MG tablet Take 500 mg by mouth every 6 (six) hours as needed.     [provider]  alendronate (FOSAMAX) 70 MG tablet Take with a full glass of water on an empty stomach. 07/02/20   Lequita Asal, MD  Calcium Carb-Cholecalciferol (CALCIUM 600+D) 600-800 MG-UNIT TABS Take 1 tablet by mouth daily.    [provider]  Cholecalciferol (VITAMIN D3) 50 MCG (2000 UT) TABS Take 1 tablet by mouth daily.    [provider]  lovastatin (MEVACOR) 10 MG tablet Take by mouth. 09/24/20   [provider]  metoprolol succinate (TOPROL-XL) 25 MG 24 hr tablet Take 25 mg by mouth daily.     [provider]  sodium chloride 1 g tablet Take 1 g by  mouth 2 (two) times daily. 10/30/20   [provider]  vitamin B-12 (CYANOCOBALAMIN) 1000 MCG tablet Take 1,000 mcg by mouth daily.    [provider]    Physical Exam: Vitals:   12/28/21 1630 12/28/21 1633 12/28/21 1700  BP:  (!) 171/67 (!) 164/57  Pulse:  73 66  Resp:  16 20  Temp:  98.6 F (37 C)   TempSrc:  Oral   SpO2:  100% 100%  Weight: 84.4 kg    Height: '5\' 2"'$  (1.575 m)     General: Patient is an obese Caucasian female who was laying comfortably in bed.  She is alert oriented and looks comfortable. HEENT: Pupils were equal round reactive to light and  accommodation.  Oral mucosa moist. Neck: Supple with no JVD.  No carotid bruit appreciated. Cardiovascular: S1-S2: No murmur. Chest: Clinically clear: Diminished at the bases of the lungs due to body habitus. Abdomen: Soft nontender, no organomegaly appreciated. Extremities with nonpitting edema. CNS: No obvious focal deficits.  CNS stable.  Right upper extremity motor deficit due to previous traumatic injury. Skin without any rash or ulcers.  Data Reviewed:    Labs significant for sodium of 130, potassium 3.9, BUN 29 creatinine 1.0.Glucose 104. Hemoglobin 10.6, hematocrit 33, platelet 191.  Urinalysis was unremarkable.  CT scan head was unremarkable for any acute intracranial abnormalities.Marland Kitchen  MRI of abdomen and pelvis on 12/19/2021 for evaluation of a right renal mass.  Findings of 3.9 cm exophytic hemorrhagic lesion in the right kidney was noted.  Reported to have increased in size but no change in morphology or contrast-enhancement.  Was described as indeterminate and recommendation for MRI in 6 months was advised.  Assessment and Plan: 81 year old with significant risk factors for stroke that includes paroxysmal atrial fibrillation, subtherapeutic Coumadin levels, hypertension and hyperlipidemia.  Presents with transient episodes of difficulty with word finding.  Denies any associated motor deficits.  No reported syncopal episode.  Patient was referred for admission on account of suspected TIA.  #1.  TIA: Patient has significant risk factors for possible stroke.  CT scan on admission was negative for any acute stroke.  We will follow-up with MRI of the brain.  Transthoracic echocardiogram has been ordered and pending.  Patient was initiated on aspirin 325 mg daily and high-dose statins.  Permissive hypertension will be advised at this time.  Carotid duplex ordered and pending.  #2.  Hyponatremia: Known to be chronic.  Has previously been on salt replacement tablets in the past.  Patient  tolerating oral intake well.  Suspect possible underlying SIADH.  Will restrict free water for now.  Monitor serum sodium.  #3.  Known right renal hemorrhagic cyst: MRI from 08/11 suggest interval increased size of right kidney cyst.  Patient has recently been on Coumadin therapy for PAF.  Unclear if contributory.  Patient has a scheduled follow-up with a urologist in the coming week.  Will defer to urology for further evaluation and management.  #4.  Subtherapeutic INR in a patient on Coumadin.  Patient on Coumadin for PAF.  She reports recent elevated INR and was advised to hold Coumadin.  INR on presentation was subtherapeutic.  Coumadin will be restarted and carefully monitored.  Patient previously been on Eliquis but was changed to Coumadin due to cost.  #5.  Acute on chronic anemia: Slight drop in hemoglobin from 1 year prior.  No report of bleeding diathesis.  #6.  Chronic arthritis: On Fosamax.  Analgesics with Tylenol.  #7.  Physical therapy, Occupational Therapy, speech therapy to evaluate patient per protocol prior to discharge.  #8.  History of frequent premature ventricular contractions: Patient has been on metoprolol.  We will continue with same pending MRI due to permissive hypertension protocol.  Patient will be on telemetry monitoring.IV metoprolol prn for arrythmia  #9.  History of breast cancer status post bilateral mastectomy.  Case   Advance Care Planning: CODE Status: Full CODE  Consults: Consult neurology  Family Communication: Husband present at bedsdie  Severity of Illness: The appropriate patient status for this patient is OBSERVATION. Observation status is judged to be reasonable and necessary in order to provide the required intensity of service to ensure the patient's safety. The patient's presenting symptoms, physical exam findings, and initial radiographic and laboratory data in the context of their medical condition is felt to place them at decreased risk for  further clinical deterioration. Furthermore, it is anticipated that the patient will be medically stable for discharge from the hospital within 2 midnights of admission.   Author: Artist Beach, MD 12/28/2021 6:47 PM  For on call review [email or web address].

## 2021-12-28 NOTE — Progress Notes (Signed)
Med History Collection - Warfarin Consult  Attempted to call patient bedside phone. No answer. Attempted to call patient spouse to collect history on warfarin dosing regimen and last dose. No response. Walked to patient bedside, RN said patient on bedside commode and unable to speak. Instructed RN to call pharmacy at [phone] when patient is finished using the bathroom and is able to provide history.

## 2021-12-29 ENCOUNTER — Observation Stay
Admit: 2021-12-29 | Discharge: 2021-12-29 | Disposition: A | Discharge status: Home / Self Care | Payer: Medicare Other | Attending: Internal Medicine | Admitting: Internal Medicine

## 2021-12-29 ENCOUNTER — Encounter: Payer: Self-pay | Admitting: Internal Medicine

## 2021-12-29 ENCOUNTER — Observation Stay: Payer: Medicare Other

## 2021-12-29 DIAGNOSIS — E871 Hypo-osmolality and hyponatremia: Secondary | ICD-10-CM

## 2021-12-29 DIAGNOSIS — N183 Chronic kidney disease, stage 3 unspecified: Secondary | ICD-10-CM

## 2021-12-29 DIAGNOSIS — R791 Abnormal coagulation profile: Secondary | ICD-10-CM | POA: Diagnosis not present

## 2021-12-29 DIAGNOSIS — G459 Transient cerebral ischemic attack, unspecified: Secondary | ICD-10-CM | POA: Diagnosis not present

## 2021-12-29 DIAGNOSIS — N1831 Chronic kidney disease, stage 3a: Secondary | ICD-10-CM

## 2021-12-29 DIAGNOSIS — C50411 Malignant neoplasm of upper-outer quadrant of right female breast: Secondary | ICD-10-CM

## 2021-12-29 DIAGNOSIS — Z170 Estrogen receptor positive status [ER+]: Secondary | ICD-10-CM

## 2021-12-29 DIAGNOSIS — I480 Paroxysmal atrial fibrillation: Secondary | ICD-10-CM

## 2021-12-29 DIAGNOSIS — D649 Anemia, unspecified: Secondary | ICD-10-CM

## 2021-12-29 DIAGNOSIS — I10 Essential (primary) hypertension: Secondary | ICD-10-CM

## 2021-12-29 DIAGNOSIS — N2889 Other specified disorders of kidney and ureter: Secondary | ICD-10-CM

## 2021-12-29 LAB — ECHOCARDIOGRAM COMPLETE
AR max vel: 2.54 cm2
AV Area VTI: 3.62 cm2
AV Area mean vel: 2.66 cm2
AV Mean grad: 2 mmHg
AV Peak grad: 4.4 mmHg
Ao pk vel: 1.05 m/s
Area-P 1/2: 4.93 cm2
Height: 62 in
S' Lateral: 3 cm
Weight: 2976 oz

## 2021-12-29 LAB — LIPID PANEL
Cholesterol: 170 mg/dL (ref 0–200)
HDL: 52 mg/dL (ref >40)
LDL Cholesterol: 108 mg/dL — ABNORMAL HIGH (ref 0–99)
Total CHOL/HDL Ratio: 3.3 RATIO
Triglycerides: 48 mg/dL (ref <150)
VLDL: 10 mg/dL (ref 0–40)

## 2021-12-29 LAB — PROTIME-INR
INR: 1.4 — ABNORMAL HIGH (ref 0.8–1.2)
Prothrombin Time: 17 seconds — ABNORMAL HIGH (ref 11.4–15.2)

## 2021-12-29 MED ORDER — IOHEXOL 350 MG/ML SOLN
80.0000 mL | Freq: Once | INTRAVENOUS | Status: AC | PRN
Start: 2021-12-29 — End: 2021-12-29
  Administered 2021-12-29: 80 mL via INTRAVENOUS

## 2021-12-29 MED ORDER — WARFARIN SODIUM 4 MG PO TABS
4.0000 mg | ORAL_TABLET | Freq: Once | ORAL | Status: AC
  Administered 2021-12-29: 4 mg via ORAL
  Filled 2021-12-29: qty 1

## 2021-12-29 MED ORDER — METOPROLOL SUCCINATE ER 25 MG PO TB24
25.0000 mg | ORAL_TABLET | Freq: Every day | ORAL | Status: DC
  Administered 2021-12-29 – 2021-12-30 (×2): 25 mg via ORAL
  Filled 2021-12-29 (×2): qty 1

## 2021-12-29 NOTE — Consult Note (Signed)
NEURO HOSPITALIST CONSULT NOTE   Requestig physician: Dr. Leslye Peer  Reason for Consult: Transient garbled speech and left sided facial droop  History obtained from:   Patient and Chart     HPI:                                                                                                                                          Kristin Mitchell is an 81 y.o. female with a PMHx of atrial fibrillation (on warfarin), arthritis, breast cancer s/p bilateral mastectomy and cataracts who presented to the ED yesterday after a spell of garbled speech with left facial droop that started at 1300 and lasted for a couple of minutes. This was the second such spell occurring on the same day, both lasting for about 2 minutes, occurring after waking from a nap and with identical symptoms. On arrival to the ED her speech was back to baseline and no facial droop was appreciated. Patient stated that she had similar symptoms approximately 2 years ago that also resolved spontaneously and she was never worked up for this.  In the ED, she denied any vision changes, tinnitus, sore throat, chest pain, shortness of breath, abdominal pain, nausea/vomiting/diarrhea, dysuria, or weakness/numbness/paresthesias in any of her extremities.  HPI from Hospitalist's note has also been reviewed:  "She presented to the emergency room on account of 2 episodes of difficulty with words finding and facial droop.  Symptoms occurred earlier today at home.  As per patient, she was sleeping but suddenly woke up with difficulty with words finding.  Episodes were witnessed by her husband who describes that patient opening her mouth and blabbing. He also described ? Facial droop.No witnessed seizure like activity.  Symptoms lasted about 1 min and resolved.  She went back to sleep and had recurrence of her symptoms.  She denies any associated focal extremity weakness or numbness.  She reports having had similar episodes in the  past.  No prior history of stroke.  No family history of stroke or TIA."  CT head in the ED: Area of hypoattenuation lies along the anterior/inferior and lateral aspect of the right thalamus, suspected to be due to chronic ischemic change. No acute abnormality noted.   Past Medical History:  Diagnosis Date   A-fib [facility])    Arthritis    Breast cancer ([place]) 11/26/2014   Cancer ([place])    breast   Intermittent palpitations    in early am    Past Surgical History:  Procedure Laterality Date   ABDOMINAL HYSTERECTOMY     CATARACT EXTRACTION W/PHACO Left 11/26/2014   Procedure: CATARACT EXTRACTION PHACO AND INTRAOCULAR LENS PLACEMENT ([place]);  Surgeon: Estill Cotta, MD;  Location: [facility] ORS;  Service: Ophthalmology;  Laterality: Left;  US:01:24 AP:26.6 CDE:40.16 PACk [id number] H  CHOLECYSTECTOMY     EYE SURGERY     MASTECTOMY     bilateral    Family History  Problem Relation Age of Onset   Parkinson's disease Mother    Hodgkin's lymphoma Father    Prostate cancer Neg Hx    Bladder Cancer Neg Hx    Kidney cancer Neg Hx             Social History:  reports that she has never smoked. She has never used smokeless tobacco. She reports that she does not drink alcohol and does not use drugs.  Allergies  Allergen Reactions   Penicillins Rash    MEDICATIONS:                                                                                                                     Prior to Admission:  Medications Prior to Admission  Medication Sig Dispense Refill Last Dose   acetaminophen (TYLENOL) 500 MG tablet Take 500 mg by mouth every 6 (six) hours as needed.    12/28/2021   alendronate (FOSAMAX) 70 MG tablet Take with a full glass of water on an empty stomach. (Patient taking differently: Take 70 mg by mouth once a week. Take with a full glass of water on an empty stomach.) 12 tablet 0 Past Week   Calcium Carb-Cholecalciferol (CALCIUM 600+D) 600-800 MG-UNIT TABS Take 1 tablet by  mouth daily.   12/28/2021   Cholecalciferol (VITAMIN D3) 50 MCG (2000 UT) TABS Take 1 tablet by mouth daily.   12/28/2021   metoprolol succinate (TOPROL-XL) 50 MG 24 hr tablet Take 50 mg by mouth daily.   12/28/2021   sodium chloride 1 g tablet Take 1 g by mouth 2 (two) times daily.   12/28/2021   vitamin B-12 (CYANOCOBALAMIN) 1000 MCG tablet Take 1,000 mcg by mouth daily.   12/28/2021   lovastatin (MEVACOR) 10 MG tablet Take 10 mg by mouth at bedtime.   12/27/2021   warfarin (COUMADIN) 5 MG tablet Take 5 mg by mouth daily.   12/27/2021 at 1600   Scheduled:  aspirin  300 mg Rectal Daily   Or   aspirin  325 mg Oral Daily   atorvastatin  80 mg Oral Daily   cyanocobalamin  1,000 mcg Oral Daily   metoprolol succinate  25 mg Oral Daily   sodium chloride  1 g Oral BID   warfarin  4 mg Oral ONCE-1600   Warfarin - Pharmacist Dosing Inpatient   Does not apply q1600     ROS:  As per HPI.    Blood pressure (!) 177/68, pulse 70, temperature 97.6 F (36.4 C), resp. rate 17, height '5\' 2"'$  (1.575 m), weight 84.4 kg, SpO2 100 %.   General Examination:                                                                                                       Physical Exam General: Morbid obesity HEENT-  [place]/AT   Lungs- Respirations unlabored Extremities- Warm and well-perfused  Neurological Examination Mental Status: Awake and alert. Oriented x 5. Knows the president's name and can perform simple math. Thought content appropriate.  Speech fluent with intact naming and comprehension. Repetition impaired on 2/2 trials. No dysarthria. Comprehension intact; able to follow all commands without difficulty. Cranial Nerves: II: Temporal visual fields intact with no extinction to DSS. PERRL. III,IV, VI: No ptosis. EOMI. No nystagmus. V: Temp sensation equal bilaterally VII: Smile and  grimace are symmetric VIII: Hearing intact to voice IX,X: No hypophonia or hoarseness XI: Head is midline XII: Midline tongue extension Motor: RUE: 5/5 except for 3/5 deltoid in abduction to 30 degrees below the horizontal, with 2/5 strength in abduction beyond that (Patient states that this is chronic since an arm injury in February which occurred while lifting a heavy object).  RLE: 5/5 LUE: 5/5 LLE: 5/5 Sensory: Temp and FT intact x 4. No extinction to DSS. Deep Tendon Reflexes: 1+ and symmetric bilateral biceps and brachioradialis. Trace patellar reflexes in the context of prominent lower extremity adiposity.  Cerebellar: No ataxia with FNF bilaterally Gait: Deferred   Lab Results: Basic Metabolic Panel: Recent Labs  Lab 12/28/21 1640  NA 130*  K 3.9  CL 95*  CO2 26  GLUCOSE 104*  BUN 21  CREATININE 1.08*  CALCIUM 9.0    CBC: Recent Labs  Lab 12/28/21 1640  WBC 5.6  HGB 10.6*  HCT 32.8*  MCV 94.8  PLT 191    Cardiac Enzymes: No results for input(s): "CKTOTAL", "CKMB", "CKMBINDEX", "TROPONINI" in the last 168 hours.  Lipid Panel: Recent Labs  Lab 12/29/21 0643  CHOL 170  TRIG 48  HDL 52  CHOLHDL 3.3  VLDL 10  LDLCALC 108*    Imaging: US Carotid Bilateral  Result Date: 12/29/2021 CLINICAL DATA:  Hypertension Hyperlipidemia EXAM: BILATERAL CAROTID DUPLEX ULTRASOUND TECHNIQUE: Pearline Cables scale imaging, color Doppler and duplex ultrasound were performed of bilateral carotid and vertebral arteries in the neck. COMPARISON:  None available FINDINGS: Criteria: Quantification of carotid stenosis is based on velocity parameters that correlate the residual internal carotid diameter with NASCET-based stenosis levels, using the diameter of the distal internal carotid lumen as the denominator for stenosis measurement. The following velocity measurements were obtained: RIGHT ICA: 139/37 cm/sec CCA: 57/01 cm/sec SYSTOLIC ICA/CCA RATIO:  2.4 ECA: 61 cm/sec LEFT ICA: 94/25  cm/sec CCA: 77/93 cm/sec SYSTOLIC ICA/CCA RATIO:  1.1 ECA: 80 cm/sec RIGHT CAROTID ARTERY: Mildly elevated peak systolic velocity in the right internal carotid artery is likely artifact given only minimal heterogeneous plaque of the carotid bifurcation. RIGHT VERTEBRAL ARTERY:  Antegrade flow. LEFT CAROTID  ARTERY: Minimal atheromatous plaque of the carotid bifurcation. LEFT VERTEBRAL ARTERY:  Antegrade flow. IMPRESSION: No significant stenosis of internal carotid arteries. Electronically Signed   By: Miachel Roux M.D.   On: 12/29/2021 07:47   MR BRAIN WO CONTRAST  Result Date: 12/28/2021 CLINICAL DATA:  Transient ischemic attack, slurred and garbled speech and left-sided facial droop EXAM: MRI HEAD WITHOUT CONTRAST TECHNIQUE: Multiplanar, multiecho pulse sequences of the brain and surrounding structures were obtained without intravenous contrast. COMPARISON:  No prior MRI, correlation is made with CT head 12/28/2021 FINDINGS: Brain: No restricted diffusion to suggest acute or subacute infarct. No acute hemorrhage, mass, mass effect, or midline shift. No hemosiderin deposition to suggest remote hemorrhage. No hydrocephalus or extra-axial collection. 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 Scattered T2 hyperintense signal in the periventricular white matter, likely the sequela of mild chronic small vessel ischemic disease. Vascular: Normal arterial flow voids. Skull and upper cervical spine: Normal marrow signal. Sinuses/Orbits: Small mucous retention cyst in the right frontal sinus. Status post bilateral lens replacements. Other: The mastoids are well aerated. IMPRESSION: No acute intracranial process. No evidence of acute or subacute infarct. Electronically Signed   By: Merilyn Baba M.D.   On: 12/28/2021 23:36   CT HEAD WO CONTRAST  Result Date: 12/28/2021 CLINICAL DATA:  Pt had slurred and garbled speech and L sided facial droop that started at 1300 and only lasted a couple minutes- pt has normal  speech now, no visual changes, no facial droop noted- stroke screen negative- pt does take warfarin with last dose last night EXAM: CT HEAD WITHOUT CONTRAST TECHNIQUE: Contiguous axial images were obtained from the base of the skull through the vertex without intravenous contrast. RADIATION DOSE REDUCTION: This exam was performed according to the departmental dose-optimization program which includes automated exposure control, adjustment of the mA and/or kV according to patient size and/or use of iterative reconstruction technique. COMPARISON:  None Available. FINDINGS: Brain: No evidence of acute infarction, hemorrhage, hydrocephalus, extra-axial collection or mass lesion/mass effect. Area of hypoattenuation lies along the anterior/inferior and lateral aspect of the right thalamus, suspected to be due to chronic ischemic change. Vascular: No hyperdense vessel or unexpected calcification. Skull: Normal. Negative for fracture or focal lesion. Sinuses/Orbits: Globes and orbits are unremarkable. Visualized sinuses are clear. Other: None. IMPRESSION: 1. No acute intracranial abnormalities. Electronically Signed   By: Lajean Manes M.D.   On: 12/28/2021 17:55     Assessment: 81 y.o. female with a PMHx of atrial fibrillation (on warfarin), arthritis, breast cancer s/p bilateral mastectomy and cataracts who presented to the ED yesterday after a spell of garbled speech with left facial droop that started at 1300 and lasted for a couple of minutes. This was the second such spell occurring on the same day, both lasting for about 2 minutes, occurring after waking from a nap and with identical symptoms. On arrival to the ED her speech was back to baseline and no facial droop was appreciated. Patient stated that she had similar symptoms approximately 2 years ago that also resolved spontaneously and she was never worked up for this. - Exam reveals chronic right deltoid weakness secondary to what most likely was a rotator cuff  injury.  Imaging: - CT head in the ED: Area of hypoattenuation lies along the anterior/inferior and lateral aspect of the right thalamus, suspected to be due to chronic ischemic change. No acute abnormality noted.  - MRI brain without contrast: No acute intracranial process. No evidence of acute or  subacute infarct. - Carotid ultrasound: No significant stenosis of internal carotid arteries. Vertebral arteries with antegrade flow.  - TTE completed with report pending.  - UDS negative.  - Subtherapeutic INR of 1.3 on admission, increased slightly to 1.4 this AM - Overall impression: Transient expressive and receptive aphasia occurring twice on Sunday was most likely secondary to TIA  Recommendations: - ASA was started by Hospitalist service. As she is already on warfarin for stroke prevention in the setting of her a-fib, addition of ASA is not indicated (risk of bleeding outweighs potential benefit for stroke prevention) - Pharmacy to dose warfarin. Patient states that she is unable to afford the newer oral anticoagulants.  - CTA of head and neck (CTA neck will complement the carotid ultrasound which has already been performed, potentially providing additional diagnostically useful information).  - Cardiac telemetry - HgbA1c, fasting lipid panel - PT consult, OT consult, Speech consult - Statin -  Risk factor modification - Frequent neuro checks - NPO until passes stroke swallow screen - Ortopaedics consult inpatient or outpatient for what appears most likely to be a right rotator cuff injury. She states it was sustained sometime around February of this year while lifting a heavy object.    Electronically signed: Dr. Kerney Elbe 12/29/2021, 9:43 AM

## 2021-12-29 NOTE — Assessment & Plan Note (Signed)
Toprol-XL for rate control and increase Coumadin dose.

## 2021-12-29 NOTE — Assessment & Plan Note (Signed)
Patient had 2 episodes of slurred speech and facial droop.  Symptoms resolved.  Seen by neurology and he recommended Coumadin alone without any antiplatelet agents or bridge while INR comes up.  Patient had a subtherapeutic INR of 1.3 on presentation and 1.5 upon discharge.  Echocardiogram normal. Carotid ultrasound negative.  MRI of the brain and CT angio of the head and neck were negative.Marland Kitchen  LDL 108.  Placed on high-dose atorvastatin.

## 2021-12-29 NOTE — Assessment & Plan Note (Signed)
History of breast cancer.

## 2021-12-29 NOTE — Assessment & Plan Note (Signed)
3.9 exophytic hemorrhagic lesion in the right kidney has increased in size on the imaging on 07/19/2021.  Radiologist commented likely benign lesion and recommended a MRI in 6 months with contrast.

## 2021-12-29 NOTE — Progress Notes (Signed)
*  PRELIMINARY RESULTS* Echocardiogram 2D Echocardiogram has been performed.  Kristin Mitchell 12/29/2021, 9:21 AM

## 2021-12-29 NOTE — Assessment & Plan Note (Signed)
Suspect anemia of chronic disease.  We will check a ferritin tomorrow.

## 2021-12-29 NOTE — Evaluation (Signed)
Physical Therapy Evaluation Patient Details Name: Kristin Mitchell MRN: [id number] DOB: 1941-04-11 Today's Date: 12/29/2021  History of Present Illness  Pt is an 81 y.o. female with medical history significant of paroxysmal afib , recently started on coumadin, hypertension, hyperlipidemia, arthritis and history of breast cancer.  She is right-handed and at baseline she is ADL and IADL independent.  She presented to the emergency room on account of 2 episodes of difficulty with words finding and facial droop.   Clinical Impression  Patient alert, agreeable to PT, oriented x4, denied pain. Pt reported at baseline she is independent/modI for ADLs, uses a rollator for all community and household ambulation, husband normally drives (but is having trouble with vertigo). Was recently attending outpatient PT to maximize BLE prior to surgery per pt.  Pt with limitations in RUE (chronic), LUE AROM WFLs. Pt BLE with some generalized weakness but able to move both against gravity, and coordination/sensation WFLs. She performed bed mobility modI, transferred with RW modI, and ambulated modI-supervision ~21f. Pt with slow velocity, reciprocal gait pattern, did stop to speak to PT often. The patient demonstrated and reported return to baseline level of functioning, no further acute PT needs indicated. PT to sign off. Please reconsult PT if pt status changes or acute needs are identified. Recommendation is for pt to resume outpatient PT in preparation for surgery.         Recommendations for follow up therapy are one component of a multi-disciplinary discharge planning process, led by the attending physician.  Recommendations may be updated based on patient status, additional functional criteria and insurance authorization.  Follow Up Recommendations Outpatient PT; (no acute PT needs)      Assistance Recommended at Discharge PRN  Patient can return home with the following  Help with stairs or ramp for  entrance;Assist for transportation    Equipment Recommendations None recommended by PT  Recommendations for Other Services       Functional Status Assessment  (pt was attending outpatient PT due to generalized BLE weakness in prep for surgery.)     Precautions / Restrictions Precautions Precautions: Fall Restrictions Weight Bearing Restrictions: No      Mobility  Bed Mobility Overal bed mobility: Modified Independent                  Transfers Overall transfer level: Modified independent Equipment used: Rolling walker (2 wheels)                    Ambulation/Gait Ambulation/Gait assistance: Supervision Gait Distance (Feet): 60 Feet Assistive device: Rolling walker (2 wheels)         General Gait Details: slow gait, reciprocal pattern, pt often stops to speak  Stairs            Wheelchair Mobility    Modified Rankin (Stroke Patients Only)       Balance Overall balance assessment: Needs assistance Sitting-balance support: Feet supported Sitting balance-Leahy Scale: Good       Standing balance-Leahy Scale: Good Standing balance comment: use of RW throughout                             Pertinent Vitals/Pain Pain Assessment Pain Assessment: No/denies pain    Home Living Family/patient expects to be discharged to:: Private residence Living Arrangements: Spouse/significant other Available Help at Discharge: Family;Available 24 hours/day (husband recently having trouble with vertigo) Type of Home: House Home Access: Level entry  Home Layout: One level Home Equipment: Rollator (4 wheels);Rolling Walker (2 wheels);Shower seat - built in;Grab bars - tub/shower      Prior Function Prior Level of Function : Independent/Modified Independent             Mobility Comments: some current issues with transportation, but at baseline pt is ambulatory with rollator       Hand Dominance   Dominant Hand: Right     Extremity/Trunk Assessment   Upper Extremity Assessment Upper Extremity Assessment: Defer to OT evaluation;RUE deficits/detail RUE Deficits / Details: unable to actively raise arm due to chronic issue    Lower Extremity Assessment Lower Extremity Assessment: Generalized weakness (sensation and coordination intact)       Communication   Communication: No difficulties  Cognition Arousal/Alertness: Awake/alert Behavior During Therapy: WFL for tasks assessed/performed Overall Cognitive Status: Within Functional Limits for tasks assessed                                          General Comments      Exercises     Assessment/Plan    PT Assessment Patient needs continued PT services  PT Problem List Decreased balance;Decreased mobility       PT Treatment Interventions Therapeutic exercise;Balance training;Gait training;Stair training;Neuromuscular re-education;Functional mobility training;Therapeutic activities;Patient/family education    PT Goals (Current goals can be found in the Care Plan section)       Frequency       Co-evaluation               AM-PAC PT "6 Clicks" Mobility  Outcome Measure Help needed turning from your back to your side while in a flat bed without using bedrails?: None Help needed moving from lying on your back to sitting on the side of a flat bed without using bedrails?: None Help needed moving to and from a bed to a chair (including a wheelchair)?: None Help needed standing up from a chair using your arms (e.g., wheelchair or bedside chair)?: None Help needed to walk in hospital room?: None Help needed climbing 3-5 steps with a railing? : A Little 6 Click Score: 23    End of Session Equipment Utilized During Treatment: Gait belt Activity Tolerance: Patient tolerated treatment well Patient left: in chair;with chair alarm set;with call bell/phone within reach Nurse Communication: Mobility status PT Visit Diagnosis:  Other abnormalities of gait and mobility (R26.89)    Time: 3662-9476 PT Time Calculation (min) (ACUTE ONLY): 20 min   Charges:   PT Evaluation $PT Eval Low Complexity: 1 Low PT Treatments $Therapeutic Activity: 8-22 mins        Lieutenant Diego PT, DPT 9:52 AM,12/29/21

## 2021-12-29 NOTE — Assessment & Plan Note (Signed)
Switch to atorvastatin high intensity statin.  LDL 108.

## 2021-12-29 NOTE — Evaluation (Signed)
Occupational Therapy Evaluation Patient Details Name: Kristin Mitchell MRN: [id number] DOB: [birth year] Today's Date: 12/29/2021   History of Present Illness Pt is an 81 y.o. female with medical history significant of paroxysmal afib , recently started on coumadin, hypertension, hyperlipidemia, arthritis and history of breast cancer.  She is right-handed and at baseline she is ADL and IADL independent.  She presented to the emergency room on account of 2 episodes of difficulty with words finding and facial droop.   Clinical Impression   Pt was seen for OT evaluation this date. Prior to hospital admission, pt was mod indep with ADL and mobility. Pt presents to acute OT demonstrating no impaired ADL performance and functional mobility from baseline (See OT problem list). Pt mod indep with ADL and mobility using AD. No unilateral or acute deficits or impairments noted with assessment. No skilled OT needs identified. Will sign off.      Recommendations for follow up therapy are one component of a multi-disciplinary discharge planning process, led by the attending physician.  Recommendations may be updated based on patient status, additional functional criteria and insurance authorization.   Follow Up Recommendations  No OT follow up    Assistance Recommended at Discharge PRN  Patient can return home with the following      Functional Status Assessment  Patient has not had a recent decline in their functional status  Equipment Recommendations  None recommended by OT    Recommendations for Other Services       Precautions / Restrictions Precautions Precautions: Fall Restrictions Weight Bearing Restrictions: No      Mobility Bed Mobility               General bed mobility comments: NT, up in recliner    Transfers Overall transfer level: Modified independent Equipment used: Rolling walker (2 wheels)                      Balance Overall balance assessment:  Needs assistance Sitting-balance support: Feet supported Sitting balance-Leahy Scale: Good       Standing balance-Leahy Scale: Good                             ADL either performed or assessed with clinical judgement   ADL Overall ADL's : Modified independent;At baseline                                             Vision         Perception     Praxis      Pertinent Vitals/Pain Pain Assessment Pain Assessment: No/denies pain     Hand Dominance Right   Extremity/Trunk Assessment Upper Extremity Assessment Upper Extremity Assessment: RUE deficits/detail RUE Deficits / Details: unable to actively raise arm due to chronic issue; sensation and FMC intact bilaterally   Lower Extremity Assessment Lower Extremity Assessment: Generalized weakness       Communication Communication Communication: No difficulties   Cognition Arousal/Alertness: Awake/alert Behavior During Therapy: WFL for tasks assessed/performed Overall Cognitive Status: Within Functional Limits for tasks assessed                                       General Comments  Exercises Other Exercises Other Exercises: Pt educated in s/s stroke   Shoulder Instructions      Home Living Family/patient expects to be discharged to:: Private residence Living Arrangements: Spouse/significant other Available Help at Discharge: Family;Available 24 hours/day (husband recently having trouble with vertigo) Type of Home: House Home Access: Level entry     Home Layout: One level     Bathroom Shower/Tub: Occupational psychologist: Handicapped height Bathroom Accessibility: Yes   Home Equipment: Rollator (4 wheels);Rolling Walker (2 wheels);Shower seat - built in;Grab bars - tub/shower          Prior Functioning/Environment Prior Level of Function : Independent/Modified Independent             Mobility Comments: some current issues with  transportation, but at baseline pt is ambulatory with rollator          OT Problem List: Decreased range of motion;Decreased strength      OT Treatment/Interventions:      OT Goals(Current goals can be found in the care plan section) Acute Rehab OT Goals Patient Stated Goal: go home OT Goal Formulation: All assessment and education complete, DC therapy  OT Frequency:      Co-evaluation              AM-PAC OT "6 Clicks" Daily Activity     Outcome Measure Help from another person eating meals?: None Help from another person taking care of personal grooming?: None Help from another person toileting, which includes using toliet, bedpan, or urinal?: None Help from another person bathing (including washing, rinsing, drying)?: None Help from another person to put on and taking off regular upper body clothing?: None Help from another person to put on and taking off regular lower body clothing?: None 6 Click Score: 24   End of Session    Activity Tolerance: Patient tolerated treatment well Patient left: in chair;with call bell/phone within reach;with chair alarm set  OT Visit Diagnosis: Other abnormalities of gait and mobility (R26.89)                Time: 1010-1026 OT Time Calculation (min): 16 min Charges:  OT General Charges $OT Visit: 1 Visit OT Evaluation $OT Eval Low Complexity: 1 Low  Ardeth Perfect., MPH, MS, OTR/L ascom [phone] 12/29/21, 1:00 PM

## 2021-12-29 NOTE — Progress Notes (Signed)
Progress Note   Patient: Kristin Mitchell KWI:[id number] DOB: [birth year] DOA: 12/28/2021     0 DOS: the patient was seen and examined on 12/29/2021    Assessment and Plan: * TIA (transient ischemic attack) Patient had 2 episodes of slurred speech and facial droop.  Patient thinks that her speech has improved.  Was given aspirin.  Patient had a subtherapeutic INR.  She was switched to Coumadin secondary to cost issues.  Was not able to afford the newer blood thinners.  Case discussed with pharmacy and we increased her Coumadin dose to 4 mg for today.  Recheck INR tomorrow.  Echocardiogram normal.  Carotid ultrasound negative.  Neurology wanted a CT angio of the head and neck which was ordered.  LDL 108.  Placed on high-dose atorvastatin.  Subtherapeutic international normalized ratio (INR) Is discussed with pharmacy and will give 4 mg of Coumadin today.  Recheck INR tomorrow morning  Paroxysmal atrial fibrillation (HCC) Toprol-XL for rate control and increase Coumadin dose.  CKD (chronic kidney disease), stage III (HCC) CKD stage IIIa.  Last creatinine 1.08 with a GFR 52.  Right renal mass 3.9 exophytic hemorrhagic lesion in the right kidney has increased in size on the imaging on 07/19/2021.  Radiologist commented likely benign lesion and recommended a MRI in 6 months with contrast.  Hyponatremia Chronic hyponatremia on salt tablets.  Last sodium 130.  Normocytic anemia Suspect anemia of chronic disease.  We will check a ferritin tomorrow.  Essential hypertension Continue Toprol-XL.  Breast cancer of upper-outer quadrant of right female breast ([place]) History of breast cancer.  Hypercholesteremia Switch to atorvastatin high intensity statin.  LDL 108.        Subjective: Patient came in with 2 episodes of slurred speech and also noted to have a facial droop.  Physical Exam: Vitals:   12/29/21 0813 12/29/21 1152 12/29/21 1210 12/29/21 1603  BP: (!) 177/68 (!) 142/68 (!)  175/72 (!) 147/70  Pulse: 70 73 73 78  Resp: '17  18 18  '$ Temp: 97.6 F (36.4 C)  97.9 F (36.6 C) (!) 97.2 F (36.2 C)  TempSrc:      SpO2: 100%  100% 100%  Weight:      Height:       Physical Exam HENT:     Head: Normocephalic.     Mouth/Throat:     Pharynx: No oropharyngeal exudate.  Eyes:     General: Lids are normal.     Conjunctiva/sclera: Conjunctivae normal.  Cardiovascular:     Rate and Rhythm: Normal rate and regular rhythm.     Heart sounds: Normal heart sounds, S1 normal and S2 normal.  Pulmonary:     Breath sounds: No decreased breath sounds, wheezing, rhonchi or rales.  Abdominal:     Palpations: Abdomen is soft.     Tenderness: There is no abdominal tenderness.  Musculoskeletal:     Right lower leg: No swelling.     Left lower leg: No swelling.  Skin:    General: Skin is warm.     Findings: No rash.  Neurological:     Mental Status: She is alert and oriented to person, place, and time.     Comments: Intermittent slurred speech.  Not noticing a facial droop.     Data Reviewed: Hemoglobin 10.6, sodium 130, creatinine 1.08  Family Communication: Spoke with husband on the phone  Disposition: Status is: Observation Neurology wanted to get a CT angiogram of the head and neck.  INR is subtherapeutic.  We will check INR tomorrow.  Planned Discharge Destination: Home    Time spent: 28 minutes  Author: Loletha Grayer, MD 12/29/2021 5:52 PM  For on call review [email or web address].

## 2021-12-29 NOTE — Assessment & Plan Note (Signed)
Continue Toprol-XL. 

## 2021-12-29 NOTE — Evaluation (Addendum)
Speech Language Pathology Evaluation Patient Details Name: Kristin Mitchell MRN: [id number] DOB: [birth year] Today's Date: 12/29/2021 Time: 9798-9211 SLP Time Calculation (min) (ACUTE ONLY): 20 min  Problem List:  Patient Active Problem List   Diagnosis Date Noted   TIA (transient ischemic attack) 12/28/2021   Adnexal cyst 03/06/2020   Right renal mass 02/20/2020   Osteopenia of spine 02/20/2020   Goals of care, counseling/discussion 02/20/2020   Normocytic anemia 12/07/2019   Hyponatremia 12/07/2019   Pain in limb 10/10/2019   Osteoporosis without current pathological fracture 11/25/2018   Gonalgia 06/10/2015   Arthritis of knee, degenerative 06/10/2015   Essential (primary) hypertension 05/10/2015   Breast cancer of upper-outer quadrant of right female breast ([place]) 11/26/2014   Primary osteoarthritis of both knees 05/01/2014   H/O bilateral mastectomy 05/01/2014   H/O malignant neoplasm of breast 05/01/2014   Hypercholesteremia 05/01/2014   Hypercholesterolemia 05/01/2014   Past Medical History:  Past Medical History:  Diagnosis Date   A-fib [facility])    Arthritis    Breast cancer ([place]) 11/26/2014   Cancer ([place])    breast   Intermittent palpitations    in early am   Past Surgical History:  Past Surgical History:  Procedure Laterality Date   ABDOMINAL HYSTERECTOMY     CATARACT EXTRACTION W/PHACO Left 11/26/2014   Procedure: CATARACT EXTRACTION PHACO AND INTRAOCULAR LENS PLACEMENT ([place]);  Surgeon: Estill Cotta, MD;  Location: [facility] ORS;  Service: Ophthalmology;  Laterality: Left;  US:01:24 AP:26.6 CDE:40.16 PACk HER:[id number] H   CHOLECYSTECTOMY     EYE SURGERY     MASTECTOMY     bilateral   HPI:  Per H&P "Tzipora Mcinroy is a 81 y.o. female with medical history significant of paroxysmal afib , recently started on coumadin, hypertension, hyperlipidemia, arthritis and history of breast cancer.  She is right-handed and at baseline she is ADL and IADL  independent.  She presented to the emergency room on account of 2 episodes of difficulty with words finding and facial droop.  Symptoms occurred earlier today at home.  As per patient, she was sleeping but suddenly woke up with difficulty with words finding.  Episodes were witnessed by her husband who describes that patient opening her mouth and blabbing. He also described ? Facial droop.No witnessed seizure like activity.  Symptoms lasted about 1 min and resolved.  She went back to sleep and had recurrence of her symptoms.  She denies any associated focal extremity weakness or numbness.  She reports having had similar episodes in the past.  No prior history of stroke.  No family history of stroke or TIA.  On presentation NIHSS was scored as 0. Patient was deemed not a candidate for tPA." MRI 12/28/21 "No acute intracranial process. No evidence of acute or subacute  infarct."   Assessment / Plan / Recommendation Clinical Impression  Pt seen for speech/language/cognitive evaluation. Pt alert, pleasant, and cooperative. Consuming breakfast upon SLP entrance to room. No s/sx oropharyngeal dysphagia appreciated. Endorses resolution of speech/language difficulty.   Assessment completed via informal means. Pt demonstrated intact functional cognitive-linguistic ability during assessment with intact auditory comprehension, verbal expression, attention, memory, problem solving, and insight.   SLP to sign off as pt has no acute SLP needs at this time.  Pt and RN made aware of results, recommendations, and SLP POC. MD also made aware. Pt verbalized understanding/agreement.    SLP Assessment  SLP Recommendation/Assessment: Patient does not need any further Speech Lanaguage Pathology Services SLP Visit Diagnosis: Cognitive communication deficit (  R41.841)    Recommendations for follow up therapy are one component of a multi-disciplinary discharge planning process, led by the attending physician.  Recommendations  may be updated based on patient status, additional functional criteria and insurance authorization.    Follow Up Recommendations  No SLP follow up    Assistance Recommended at Discharge  PRN (defer to OT/PT)  Functional Status Assessment Patient has not had a recent decline in their functional status     SLP Evaluation Cognition  Overall Cognitive Status: Within Functional Limits for tasks assessed Arousal/Alertness: Awake/alert Orientation Level: Oriented X4 Attention:  [facility]) Memory: Appears intact Awareness: Appears intact Problem Solving: Appears intact Safety/Judgment: Appears intact       Comprehension  Auditory Comprehension Overall Auditory Comprehension: Appears within functional limits for tasks assessed Yes/No Questions: Within Functional Limits Commands: Within Functional Limits Visual Recognition/Discrimination Discrimination: Not tested Reading Comprehension Reading Status: Not tested    Expression Expression Primary Mode of Expression: Verbal Verbal Expression Overall Verbal Expression: Appears within functional limits for tasks assessed Initiation: No impairment Automatic Speech:  (WFL; social automatics) Repetition: No impairment Naming: No impairment Written Expression Written Expression: Not tested   Oral / Motor  Oral Motor/Sensory Function Overall Oral Motor/Sensory Function: Within functional limits Motor Speech Overall Motor Speech: Appears within functional limits for tasks assessed Respiration: Within functional limits Phonation: Normal Resonance: Within functional limits Articulation: Within functional limitis Intelligibility: Intelligible Motor Planning: Witnin functional limits           Cherrie Gauze, M.S., [facility] [phone] ([place])   Quintella Baton 12/29/2021, 8:54 AM

## 2021-12-29 NOTE — Progress Notes (Addendum)
ANTICOAGULATION CONSULT NOTE -  Pharmacy Consult for Warfarin  Indication: atrial fibrillation  Allergies  Allergen Reactions   Penicillins Rash    Patient Measurements: Height: '5\' 2"'$  (157.5 cm) Weight: 84.4 kg (186 lb) IBW/kg (Calculated) : 50.1 Heparin Dosing Weight:   Vital Signs: Temp: 97.9 F (36.6 C) (08/21 0320) Temp Source: Oral (08/21 0320) BP: 158/59 (08/21 0320) Pulse Rate: 64 (08/21 0320)  Labs: Recent Labs    12/28/21 1640 12/29/21 0643  HGB 10.6*  --   HCT 32.8*  --   PLT 191  --   LABPROT 15.9* 17.0*  INR 1.3* 1.4*  CREATININE 1.08*  --      Estimated Creatinine Clearance: 41.1 mL/min (A) (by C-G formula based on SCr of 1.08 mg/dL (H)).   Medical History: Past Medical History:  Diagnosis Date   A-fib [facility])    Arthritis    Breast cancer ([place]) 11/26/2014   Cancer ([place])    breast   Intermittent palpitations    in early am    Medications:  Medications Prior to Admission  Medication Sig Dispense Refill Last Dose   acetaminophen (TYLENOL) 500 MG tablet Take 500 mg by mouth every 6 (six) hours as needed.       alendronate (FOSAMAX) 70 MG tablet Take with a full glass of water on an empty stomach. 12 tablet 0    Calcium Carb-Cholecalciferol (CALCIUM 600+D) 600-800 MG-UNIT TABS Take 1 tablet by mouth daily.      Cholecalciferol (VITAMIN D3) 50 MCG (2000 UT) TABS Take 1 tablet by mouth daily.      lovastatin (MEVACOR) 10 MG tablet Take by mouth.      metoprolol succinate (TOPROL-XL) 25 MG 24 hr tablet Take 25 mg by mouth daily.       sodium chloride 1 g tablet Take 1 g by mouth 2 (two) times daily.      vitamin B-12 (CYANOCOBALAMIN) 1000 MCG tablet Take 1,000 mcg by mouth daily.      warfarin (COUMADIN) 5 MG tablet Take 5 mg by mouth daily.       Assessment: Pharmacy consulted to dose warfarin in this 81 year old female admitted with possible stroke/TIA.   -Pt was on warfarin 5 mg PO 2 days per week alternating with 2.5 mg the other days prior  to outpatient INR of ~ 4.3. Pt instructed to hold warfarin on 8/14 and then restart on 8/17 with dose of 2.5 mg PO daily.  Pt reports restarted warfarin 2.5 mg PO daily on 8/17, last dose on 8/19.  8/20 INR @ 16:40 = 1.3 Warfarin 3.'75mg'$  8/21 INR 1.4  Goal of Therapy:  INR 2-3    Plan:  Will order warfarin 4 mg x 1 dose today. Pt also on Aspirin '325mg'$  po daily currently Will recheck INR with AM labs.  CBC per protocol  Linette Gunderson A 12/29/2021,7:59 AM

## 2021-12-29 NOTE — Assessment & Plan Note (Deleted)
CKD stage IIIa.  Last creatinine 1.08 with a GFR 52.

## 2021-12-29 NOTE — Assessment & Plan Note (Signed)
Is discussed with pharmacy and will give 5 mg of Coumadin prior to discharge.  Recheck INR on Friday

## 2021-12-29 NOTE — Assessment & Plan Note (Signed)
Chronic hyponatremia on salt tablets.  Last sodium 130.

## 2021-12-30 DIAGNOSIS — N182 Chronic kidney disease, stage 2 (mild): Secondary | ICD-10-CM

## 2021-12-30 DIAGNOSIS — R791 Abnormal coagulation profile: Secondary | ICD-10-CM | POA: Diagnosis not present

## 2021-12-30 DIAGNOSIS — N2889 Other specified disorders of kidney and ureter: Secondary | ICD-10-CM | POA: Diagnosis not present

## 2021-12-30 DIAGNOSIS — E871 Hypo-osmolality and hyponatremia: Secondary | ICD-10-CM | POA: Diagnosis not present

## 2021-12-30 DIAGNOSIS — I480 Paroxysmal atrial fibrillation: Secondary | ICD-10-CM | POA: Diagnosis not present

## 2021-12-30 DIAGNOSIS — G459 Transient cerebral ischemic attack, unspecified: Secondary | ICD-10-CM | POA: Diagnosis not present

## 2021-12-30 LAB — BASIC METABOLIC PANEL
Anion gap: 5 (ref 5–15)
BUN: 23 mg/dL (ref 8–23)
CO2: 23 mmol/L (ref 22–32)
Calcium: 8.9 mg/dL (ref 8.9–10.3)
Chloride: 107 mmol/L (ref 98–111)
Creatinine, Ser: 0.92 mg/dL (ref 0.44–1.00)
GFR, Estimated: >60 mL/min (ref >60)
Glucose, Bld: 90 mg/dL (ref 70–99)
Potassium: 3.8 mmol/L (ref 3.5–5.1)
Sodium: 135 mmol/L (ref 135–145)

## 2021-12-30 LAB — CBC
HCT: 32 % — ABNORMAL LOW (ref 36.0–46.0)
Hemoglobin: 10.3 g/dL — ABNORMAL LOW (ref 12.0–15.0)
MCH: 30.5 pg (ref 26.0–34.0)
MCHC: 32.2 g/dL (ref 30.0–36.0)
MCV: 94.7 fL (ref 80.0–100.0)
Platelets: 180 10*3/uL (ref 150–400)
RBC: 3.38 MIL/uL — ABNORMAL LOW (ref 3.87–5.11)
RDW: 12.9 % (ref 11.5–15.5)
WBC: 7.6 10*3/uL (ref 4.0–10.5)
nRBC: 0 % (ref 0.0–0.2)

## 2021-12-30 LAB — FERRITIN: Ferritin: 195 ng/mL (ref 11–307)

## 2021-12-30 LAB — PROTIME-INR
INR: 1.5 — ABNORMAL HIGH (ref 0.8–1.2)
Prothrombin Time: 18.2 seconds — ABNORMAL HIGH (ref 11.4–15.2)

## 2021-12-30 MED ORDER — WARFARIN SODIUM 2.5 MG PO TABS: 2.5000 mg | ORAL_TABLET | Freq: Every day | ORAL | 0 refills | Status: DC

## 2021-12-30 MED ORDER — ATORVASTATIN CALCIUM 80 MG PO TABS: 80.0000 mg | ORAL_TABLET | Freq: Every day | ORAL | 0 refills | Status: AC

## 2021-12-30 MED ORDER — WARFARIN SODIUM 5 MG PO TABS
5.0000 mg | ORAL_TABLET | Freq: Once | ORAL | Status: AC
  Administered 2021-12-30: 5 mg via ORAL
  Filled 2021-12-30: qty 1

## 2021-12-30 NOTE — Discharge Summary (Signed)
Physician Discharge Summary   Patient: Kristin Mitchell MRN: [id number] DOB: May 07, 1941  Admit date:     12/28/2021  Discharge date: 12/30/21  Discharge Physician: Loletha Grayer   PCP: Latanya Maudlin, NP   Recommendations at discharge:   Follow-up PCP 5 days Check INR on Friday  Discharge Diagnoses: Principal Problem:   TIA (transient ischemic attack) Active Problems:   Subtherapeutic international normalized ratio (INR)   Paroxysmal atrial fibrillation (HCC)   Hypercholesteremia   Breast cancer of upper-outer quadrant of right female breast ([place])   Essential hypertension   Normocytic anemia   Hyponatremia   Right renal mass   CKD (chronic kidney disease) stage 2, GFR 60-89 ml/min   Hospital Course: Patient was brought in for an observation on 12/28/2021 and discharged on 12/30/2021.  Came in with 2 episodes of slurred speech and left facial droop.  The patient's INR was subtherapeutic at 1.3 on presentation.  MRI of the brain was negative.  Carotid ultrasound was negative, CT angio head and neck was negative.  Echocardiogram did not show any source of stroke and a normal EF.  Patient was increased on her Coumadin dosing here in the hospital and given 5 mg prior to discharge.  She will go back on 2.5 mg daily and check an INR on Friday.  The patient wants to be on Coumadin secondary to cost issues but I told her if her INR keeps on bouncing up and down may be safer to be on Eliquis.  Neurology did not recommend aspirin or any Lovenox bridge.  Just recommended increasing INR.  Assessment and Plan: * TIA (transient ischemic attack) Patient had 2 episodes of slurred speech and facial droop.  Symptoms resolved.  Seen by neurology and he recommended Coumadin alone without any antiplatelet agents or bridge while INR comes up.  Patient had a subtherapeutic INR of 1.3 on presentation and 1.5 upon discharge.  Echocardiogram normal. Carotid ultrasound negative.  MRI of the brain and CT  angio of the head and neck were negative.Marland Kitchen  LDL 108.  Placed on high-dose atorvastatin.  Subtherapeutic international normalized ratio (INR) Is discussed with pharmacy and will give 5 mg of Coumadin prior to discharge.  Recheck INR on Friday  Paroxysmal atrial fibrillation (HCC) Toprol-XL for rate control and Coumadin dosing.  CKD (chronic kidney disease) stage 2, GFR 60-89 ml/min Creatinine actually improved to 0.92 with a GFR greater than 60.  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  Right renal mass 3.9 exophytic hemorrhagic lesion in the right kidney has increased in size on the imaging on 07/19/2021.  Radiologist commented likely benign lesion and recommended a MRI in 6 months with contrast.  Suspecting hemorrhagic cyst.  Hyponatremia Chronic hyponatremia on salt tablets.  Last sodium 130.  Normocytic anemia Suspect anemia of chronic disease.  We will check a ferritin tomorrow.  Essential hypertension Continue Toprol-XL.  Breast cancer of upper-outer quadrant of right female breast ([place]) History of breast cancer.  Hypercholesteremia Switch to atorvastatin high intensity statin.  LDL 108.         Consultants: None Procedures performed: None Disposition: Home Diet recommendation:  Cardiac diet DISCHARGE MEDICATION: Allergies as of 12/30/2021       Reactions   Penicillins Rash        Medication List     STOP taking these medications    lovastatin 10 MG tablet Commonly known as: MEVACOR       TAKE  these medications    acetaminophen 500 MG tablet Commonly known as: TYLENOL Take 500 mg by mouth every 6 (six) hours as needed.   alendronate 70 MG tablet Commonly known as: FOSAMAX Take with a full glass of water on an empty stomach. What changed:  how much to take how to take this when to take this   atorvastatin 80 MG tablet Commonly known as: LIPITOR Take 1 tablet (80 mg total) by mouth daily.    Calcium 600+D 600-20 MG-MCG Tabs Generic drug: Calcium Carb-Cholecalciferol Take 1 tablet by mouth daily.   cyanocobalamin 1000 MCG tablet Commonly known as: VITAMIN B12 Take 1,000 mcg by mouth daily.   metoprolol succinate 50 MG 24 hr tablet Commonly known as: TOPROL-XL Take 50 mg by mouth daily.   sodium chloride 1 g tablet Take 1 g by mouth 2 (two) times daily.   Vitamin D3 50 MCG (2000 UT) Tabs Take 1 tablet by mouth daily.   warfarin 2.5 MG tablet Commonly known as: Coumadin Take 1 tablet (2.5 mg total) by mouth daily. Start taking on: December 31, 2021 What changed:  medication strength how much to take        Follow-up Information     Latanya Maudlin, NP. Go on 01/06/2022.   Specialty: Family Medicine Why: '@1'$ :00pm at [address] [place], [place] [address]  [phone] Contact information: [place] [address] [phone]                Discharge Exam: Danley Danker Weights   12/28/21 1630  Weight: 84.4 kg   Physical Exam HENT:     Head: Normocephalic.     Mouth/Throat:     Pharynx: No oropharyngeal exudate.  Eyes:     General: Lids are normal.     Conjunctiva/sclera: Conjunctivae normal.  Cardiovascular:     Rate and Rhythm: Normal rate and regular rhythm.     Heart sounds: Normal heart sounds, S1 normal and S2 normal.  Pulmonary:     Breath sounds: No decreased breath sounds, wheezing, rhonchi or rales.  Abdominal:     Palpations: Abdomen is soft.     Tenderness: There is no abdominal tenderness.  Musculoskeletal:     Right lower leg: No swelling.     Left lower leg: No swelling.  Skin:    General: Skin is warm.     Findings: No rash.  Neurological:     Mental Status: She is alert and oriented to person, place, and time.     Comments: Cranial nerves II through XII grossly intact.  Power 5 out of 5 upper and lower extremities.      Condition at discharge: stable  The results of significant diagnostics from  this hospitalization (including imaging, microbiology, ancillary and laboratory) are listed below for reference.   Imaging Studies: CT ANGIO HEAD NECK W WO CM  Result Date: 12/29/2021 CLINICAL DATA:  Transient ischemic attack (TIA) EXAM: CT ANGIOGRAPHY HEAD AND NECK TECHNIQUE: Multidetector CT imaging of the head and neck was performed using the standard protocol during bolus administration of intravenous contrast. Multiplanar CT image reconstructions and MIPs were obtained to evaluate the vascular anatomy. Carotid stenosis measurements (when applicable) are obtained utilizing NASCET criteria, using the distal internal carotid diameter as the denominator. RADIATION DOSE REDUCTION: This exam was performed according to the departmental dose-optimization program which includes automated exposure control, adjustment of the mA and/or kV according to patient size and/or use of iterative reconstruction technique. CONTRAST:  71m OMNIPAQUE IOHEXOL 350 MG/ML SOLN COMPARISON:  Recent MR and CT imaging FINDINGS: CT HEAD Brain: There is no acute intracranial hemorrhage, mass effect, or edema. No new loss of gray-white differentiation. 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 Ventricles and sulci are stable in size and configuration. No extra-axial collection. Vascular: Better evaluated on CTA portion Skull: Calvarium is unremarkable. Sinuses/Orbits: No acute finding. Other: None. Review of the MIP images confirms the above findings CTA NECK Aortic arch: Minimal calcified plaque. Great vessel origins are patent. Right carotid system: Patent. Calcified plaque at the bifurcation without stenosis. Left carotid system: Patent. Calcified plaque at the bifurcation and proximal internal carotid with minimal stenosis. Vertebral arteries: Patent and codominant. Calcified plaque at the right vertebral origin with minimal stenosis. Skeleton: Facet predominant cervical spine degenerative changes. Degenerative changes of the  temporomandibular joints. Other neck: Unremarkable. Upper chest: No apical lung mass. Review of the MIP images confirms the above findings CTA HEAD Anterior circulation: Intracranial internal carotid arteries are patent with calcified plaque but no significant stenosis. Anterior and middle cerebral arteries are patent. Posterior circulation: Intracranial vertebral arteries are patent with calcified plaque on the right but no significant stenosis. Basilar artery is patent. Major cerebellar artery origins are patent. Posterior cerebral arteries are patent. Venous sinuses: Patent as allowed by contrast bolus timing. Review of the MIP images confirms the above findings IMPRESSION: No acute intracranial abnormality. No large vessel occlusion, hemodynamically significant stenosis, or evidence of dissection. Electronically Signed   By: PMacy MisM.D.   On: 12/29/2021 18:20   ECHOCARDIOGRAM COMPLETE  Result Date: 12/29/2021    ECHOCARDIOGRAM REPORT   Patient Name:   DSHAMICA MOREELCare OneDate of Exam: 12/29/2021 Medical Rec #:  [id number]            Height:       62.0 in Accession #:    [id number]           Weight:       186.0 lb Date of Birth:  [birth year]             BSA:          1.854 m Patient Age:    89years              BP:           177/68 mmHg Patient Gender: F                     HR:           70 bpm. Exam Location:  [facility] Procedure: 2D Echo, Cardiac Doppler and Color Doppler Indications:     TIA G45.9  History:         Patient has no prior history of Echocardiogram examinations.                  Arrythmias:Atrial Fibrillation. Breast cancer, palpitations.  Sonographer:     JSherrie SportReferring Phys:  [id number] BeachDiagnosing Phys: DYolonda KidaMD  Sonographer Comments: Suboptimal parasternal window. IMPRESSIONS  1. Left ventricular ejection fraction, by estimation, is 50 to 55%. The left ventricle has normal function. The left ventricle has no regional wall motion abnormalities. There is  moderate concentric left ventricular hypertrophy. Left ventricular diastolic parameters are consistent with Grade II diastolic dysfunction (pseudonormalization).  2. Right ventricular systolic function is normal. The right ventricular size is normal.  3. The mitral valve is normal  in structure. Trivial mitral valve regurgitation.  4. The aortic valve is normal in structure. Aortic valve regurgitation is not visualized. Aortic valve sclerosis is present, with no evidence of aortic valve stenosis. FINDINGS  Left Ventricle: Left ventricular ejection fraction, by estimation, is 50 to 55%. The left ventricle has normal function. The left ventricle has no regional wall motion abnormalities. The left ventricular internal cavity size was normal in size. There is  moderate concentric left ventricular hypertrophy. Left ventricular diastolic parameters are consistent with Grade II diastolic dysfunction (pseudonormalization). Right Ventricle: The right ventricular size is normal. No increase in right ventricular wall thickness. Right ventricular systolic function is normal. Left Atrium: Left atrial size was normal in size. Right Atrium: Right atrial size was normal in size. Pericardium: There is no evidence of pericardial effusion. Mitral Valve: The mitral valve is normal in structure. There is mild thickening of the mitral valve leaflet(s). Mild mitral annular calcification. Trivial mitral valve regurgitation. Tricuspid Valve: The tricuspid valve is normal in structure. Tricuspid valve regurgitation is mild. Aortic Valve: The aortic valve is normal in structure. Aortic valve regurgitation is not visualized. Aortic valve sclerosis is present, with no evidence of aortic valve stenosis. Aortic valve mean gradient measures 2.0 mmHg. Aortic valve peak gradient measures 4.4 mmHg. Aortic valve area, by VTI measures 3.62 cm. Pulmonic Valve: The pulmonic valve was normal in structure. Pulmonic valve regurgitation is not visualized.  Aorta: The ascending aorta was not well visualized. IAS/Shunts: No atrial level shunt detected by color flow Doppler.  LEFT VENTRICLE PLAX 2D LVIDd:         4.10 cm   Diastology LVIDs:         3.00 cm   LV e' medial:    3.37 cm/s LV PW:         1.70 cm   LV E/e' medial:  18.6 LV IVS:        1.20 cm   LV e' lateral:   7.51 cm/s LVOT diam:     2.00 cm   LV E/e' lateral: 8.3 LV SV:         61 LV SV Index:   33 LVOT Area:     3.14 cm  RIGHT VENTRICLE RV Basal diam:  3.30 cm RV S prime:     11.20 cm/s TAPSE (M-mode): 2.0 cm LEFT ATRIUM             Index        RIGHT ATRIUM           Index LA diam:        3.60 cm 1.94 cm/m   RA Area:     12.10 cm LA Vol (A2C):   65.1 ml 35.12 ml/m  RA Volume:   24.80 ml  13.38 ml/m LA Vol (A4C):   49.8 ml 26.87 ml/m LA Biplane Vol: 62.7 ml 33.83 ml/m  AORTIC VALVE AV Area (Vmax):    2.54 cm AV Area (Vmean):   2.66 cm AV Area (VTI):     3.62 cm AV Vmax:           105.00 cm/s AV Vmean:          68.500 cm/s AV VTI:            0.169 m AV Peak Grad:      4.4 mmHg AV Mean Grad:      2.0 mmHg LVOT Vmax:         84.90 cm/s LVOT Vmean:  58.000 cm/s LVOT VTI:          0.195 m LVOT/AV VTI ratio: 1.15  AORTA Ao Root diam: 3.10 cm MITRAL VALVE                TRICUSPID VALVE MV Area (PHT): 4.93 cm     TR Peak grad:   13.1 mmHg MV Decel Time: 154 msec     TR Vmax:        181.00 cm/s MV E velocity: 62.60 cm/s MV A velocity: 106.00 cm/s  SHUNTS MV E/A ratio:  0.59         Systemic VTI:  0.20 m                             Systemic Diam: 2.00 cm Yolonda Kida MD Electronically signed by Yolonda Kida MD Signature Date/Time: 12/29/2021/4:24:38 PM    Final    US Carotid Bilateral  Result Date: 12/29/2021 CLINICAL DATA:  Hypertension Hyperlipidemia EXAM: BILATERAL CAROTID DUPLEX ULTRASOUND TECHNIQUE: Pearline Cables scale imaging, color Doppler and duplex ultrasound were performed of bilateral carotid and vertebral arteries in the neck. COMPARISON:  None available FINDINGS: Criteria:  Quantification of carotid stenosis is based on velocity parameters that correlate the residual internal carotid diameter with NASCET-based stenosis levels, using the diameter of the distal internal carotid lumen as the denominator for stenosis measurement. The following velocity measurements were obtained: RIGHT ICA: 139/37 cm/sec CCA: 92/42 cm/sec SYSTOLIC ICA/CCA RATIO:  2.4 ECA: 61 cm/sec LEFT ICA: 94/25 cm/sec CCA: 68/34 cm/sec SYSTOLIC ICA/CCA RATIO:  1.1 ECA: 80 cm/sec RIGHT CAROTID ARTERY: Mildly elevated peak systolic velocity in the right internal carotid artery is likely artifact given only minimal heterogeneous plaque of the carotid bifurcation. RIGHT VERTEBRAL ARTERY:  Antegrade flow. LEFT CAROTID ARTERY: Minimal atheromatous plaque of the carotid bifurcation. LEFT VERTEBRAL ARTERY:  Antegrade flow. IMPRESSION: No significant stenosis of internal carotid arteries. Electronically Signed   By: Miachel Roux M.D.   On: 12/29/2021 07:47   MR BRAIN WO CONTRAST  Result Date: 12/28/2021 CLINICAL DATA:  Transient ischemic attack, slurred and garbled speech and left-sided facial droop EXAM: MRI HEAD WITHOUT CONTRAST TECHNIQUE: Multiplanar, multiecho pulse sequences of the brain and surrounding structures were obtained without intravenous contrast. COMPARISON:  No prior MRI, correlation is made with CT head 12/28/2021 FINDINGS: Brain: No restricted diffusion to suggest acute or subacute infarct. No acute hemorrhage, mass, mass effect, or midline shift. No hemosiderin deposition to suggest remote hemorrhage. No hydrocephalus or extra-axial collection. 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 Scattered T2 hyperintense signal in the periventricular white matter, likely the sequela of mild chronic small vessel ischemic disease. Vascular: Normal arterial flow voids. Skull and upper cervical spine: Normal marrow signal. Sinuses/Orbits: Small mucous retention cyst in the right frontal sinus. Status post  bilateral lens replacements. Other: The mastoids are well aerated. IMPRESSION: No acute intracranial process. No evidence of acute or subacute infarct. Electronically Signed   By: Merilyn Baba M.D.   On: 12/28/2021 23:36   CT HEAD WO CONTRAST  Result Date: 12/28/2021 CLINICAL DATA:  Pt had slurred and garbled speech and L sided facial droop that started at 1300 and only lasted a couple minutes- pt has normal speech now, no visual changes, no facial droop noted- stroke screen negative- pt does take warfarin with last dose last night EXAM: CT HEAD WITHOUT CONTRAST TECHNIQUE: Contiguous axial images were obtained from the base of the  skull through the vertex without intravenous contrast. RADIATION DOSE REDUCTION: This exam was performed according to the departmental dose-optimization program which includes automated exposure control, adjustment of the mA and/or kV according to patient size and/or use of iterative reconstruction technique. COMPARISON:  None Available. FINDINGS: Brain: No evidence of acute infarction, hemorrhage, hydrocephalus, extra-axial collection or mass lesion/mass effect. Area of hypoattenuation lies along the anterior/inferior and lateral aspect of the right thalamus, suspected to be due to chronic ischemic change. Vascular: No hyperdense vessel or unexpected calcification. Skull: Normal. Negative for fracture or focal lesion. Sinuses/Orbits: Globes and orbits are unremarkable. Visualized sinuses are clear. Other: None. IMPRESSION: 1. No acute intracranial abnormalities. Electronically Signed   By: Lajean Manes M.D.   On: 12/28/2021 17:55   MR Abdomen W Wo Contrast  Result Date: 12/20/2021 CLINICAL DATA:  Follow-up right renal mass. EXAM: MRI ABDOMEN WITHOUT AND WITH CONTRAST TECHNIQUE: Multiplanar multisequence MR imaging of the abdomen was performed both before and after the administration of intravenous contrast. CONTRAST:  7.68m GADAVIST GADOBUTROL 1 MMOL/ML IV SOLN COMPARISON:   12/05/2020 FINDINGS: Lower chest: No acute findings. Hepatobiliary: No hepatic masses identified. Prior cholecystectomy. No evidence of biliary obstruction. Pancreas:  No mass or inflammatory changes. Spleen:  Within normal limits in size and appearance. Adrenals/Urinary Tract: The adrenal glands and left kidney are normal in appearance. An exophytic subcapsular lesion is again seen arising from the lateral upper pole of the right kidney which has increased in size since previous study, currently measuring 3.9 x 3.1 cm, compared to 3.1 by 2.8 cm previously. This lesion continues to show heterogeneous T1 hyperintense signal consistent with hemorrhage, however when allowing for mild motion artifact on dynamic subtraction imaging, there is no convincing evidence of internal contrast enhancement. No evidence of hydronephrosis. Stomach/Bowel: Unremarkable. Vascular/Lymphatic: No pathologically enlarged lymph nodes identified. No acute vascular findings. Other:  None. Musculoskeletal:  No suspicious bone lesions identified. IMPRESSION: 3.9 cm exophytic hemorrhagic lesion in the right kidney has increased in size, but shows no change in morphology, and no definite contrast enhancement. This most closely matches criteria for an indeterminate but probably benign Bosniak category 41F cystic lesion. Consider continued follow-up by abdomen MRI without and with contrast in 6 months. This recommendation follows ACR consensus guidelines: Management of the Incidental Renal Mass on CT: A White Paper of the ACR Incidental Findings Committee. J Am Coll Radiol [phone] No evidence of abdominal metastatic disease or other acute findings. Electronically Signed   By: JMarlaine HindM.D.   On: 12/20/2021 11:25    Microbiology: No results found for this or any previous visit.  Labs: CBC: Recent Labs  Lab 12/28/21 1640 12/30/21 0627  WBC 5.6 7.6  HGB 10.6* 10.3*  HCT 32.8* 32.0*  MCV 94.8 94.7  PLT 191 1102  Basic  Metabolic Panel: Recent Labs  Lab 12/28/21 1640 12/30/21 0627  NA 130* 135  K 3.9 3.8  CL 95* 107  CO2 26 23  GLUCOSE 104* 90  BUN 21 23  CREATININE 1.08* 0.92  CALCIUM 9.0 8.9   Liver Function Tests: Recent Labs  Lab 12/28/21 1640  AST 23  ALT 13  ALKPHOS 91  BILITOT 0.7  PROT 6.9  ALBUMIN 3.5   CBG: No results for input(s): "GLUCAP" in the last 168 hours.  Discharge time spent: greater than 30 minutes.  Signed: RLoletha Grayer MD [facility] 12/30/2021

## 2021-12-30 NOTE — Discharge Instructions (Signed)
Check your inr on Friday

## 2021-12-30 NOTE — TOC Initial Note (Signed)
Transition of Care [facility]) - Initial/Assessment Note    Patient Details  Name: Kristin Mitchell MRN: [id number] Date of Birth: 02/06/1941  Transition of Care [facility] CM/SW Contact:    Pete Pelt, RN Phone Number: 12/30/2021, 10:07 AM  Clinical Narrative:       Patient lives at home with husband, siblings live close by and assist when needed.  Patient is discharged today, feeling safe and comfortable going home.  Patient has walker at home, attends outpatient PT at [facility].  Patient drives to appointments, no concerns with medicaitons; uses a pill box to assist with compliance.              Expected Discharge Plan: OP Rehab Barriers to Discharge: Barriers Resolved   Patient Goals and CMS Choice        Expected Discharge Plan and Services Expected Discharge Plan: OP Rehab   Discharge Planning Services: CM Consult   Living arrangements for the past 2 months: Single Family Home Expected Discharge Date: 12/30/21                                    Prior Living Arrangements/Services Living arrangements for the past 2 months: Single Family Home Lives with:: Self, Spouse Patient language and need for interpreter reviewed:: Yes Do you feel safe going back to the place where you live?: Yes      Need for Family Participation in Patient Care: Yes (Comment)   Current home services: DME, Other (comment) (Outpatient PT, patient has walker at home,) Criminal Activity/Legal Involvement Pertinent to Current Situation/Hospitalization: No - Comment as needed  Activities of Daily Living Home Assistive Devices/Equipment: Walker (specify type) ADL Screening (condition at time of admission) Patient's cognitive ability adequate to safely complete daily activities?: Yes Is the patient deaf or have difficulty hearing?: No Does the patient have difficulty seeing, even when wearing glasses/contacts?: No Does the patient have difficulty concentrating, remembering, or  making decisions?: No Patient able to express need for assistance with ADLs?: Yes Does the patient have difficulty dressing or bathing?: No Independently performs ADLs?: Yes (appropriate for developmental age) Does the patient have difficulty walking or climbing stairs?: Yes Weakness of Legs: Both Weakness of Arms/Hands: Right  Permission Sought/Granted Permission sought to share information with : Case Manager Permission granted to share information with : Yes, Verbal Permission Granted     Permission granted to share info w AGENCY: Outpatient Therapy with [facility]        Emotional Assessment Appearance:: Appears younger than stated age Attitude/Demeanor/Rapport: Gracious, Engaged Affect (typically observed): Pleasant, Appropriate Orientation: : Oriented to Self, Oriented to Place, Oriented to  Time, Oriented to Situation Alcohol / Substance Use: Not Applicable Psych Involvement: No (comment)  Admission diagnosis:  TIA (transient ischemic attack) [G45.9] Slurred speech [R47.81] Facial droop [R29.810] Patient Active Problem List   Diagnosis Date Noted   Subtherapeutic international normalized ratio (INR) 12/29/2021   CKD (chronic kidney disease), stage III ([place]) 12/29/2021   Paroxysmal atrial fibrillation ([place]) 12/29/2021   TIA (transient ischemic attack) 12/28/2021   Adnexal cyst 03/06/2020   Right renal mass 02/20/2020   Osteopenia of spine 02/20/2020   Goals of care, counseling/discussion 02/20/2020   Normocytic anemia 12/07/2019   Hyponatremia 12/07/2019   Pain in limb 10/10/2019   Osteoporosis without current pathological fracture 11/25/2018   Gonalgia 06/10/2015   Arthritis of knee, degenerative 06/10/2015   Essential hypertension  05/10/2015   Breast cancer of upper-outer quadrant of right female breast ([place]) 11/26/2014   Primary osteoarthritis of both knees 05/01/2014   H/O bilateral mastectomy 05/01/2014   H/O malignant neoplasm of breast 05/01/2014    Hypercholesteremia 05/01/2014   Hypercholesterolemia 05/01/2014   PCP:  Latanya Maudlin, NP Pharmacy:   [id number] [place] [place] [address]Phone: [phone]: [phone]    Social Determinants of Health (SDOH) Interventions    Readmission Risk Interventions     No data to display

## 2021-12-30 NOTE — Plan of Care (Signed)
  Problem: Pain Managment: Goal: General experience of comfort will improve Outcome: Progressing   Problem: Safety: Goal: Ability to remain free from injury will improve Outcome: Progressing   Problem: Education: Goal: Knowledge of disease or condition will improve Outcome: Progressing Goal: Knowledge of secondary prevention will improve ([facility]) Outcome: Progressing

## 2021-12-30 NOTE — Therapy (Cosign Needed)
Occupational Therapy * Physical Therapy * Speech Therapy          DATE __22 Aug 2023_________________ PATIENT NAME_____[name]________________ PATIENT MRN______MRN:  [id number]______________  DIAGNOSIS/DIAGNOSIS CODE ___ TIA (transient ischemic attack) G45.9  ___________________ DATE OF DISCHARGE: ___22 Aug 2023___________  PRIMARY CARE PHYSICIAN __________________________ Latanya Maudlin, NP      General - Family Medicine    [phone]       Dear Provider (Name: __________________   Fax: ___________________________):   I certify that I have examined this patient and that occupational/physical/speech therapy is necessary on an outpatient basis.    The patient has expressed interest in completing their recommended course of therapy at your location.  Once a formal order from the patient's primary care physician has been obtained, please contact him/her to schedule an appointment for evaluation at your earliest convenience.   [ x ]  Physical Therapy Evaluate and Treat          [ x ]  Occupational Therapy Evaluate and Treat                                    [  ]  Speech Therapy Evaluate and Treat       The patient's primary care physician (listed above) must furnish and be responsible for a formal order such that the recommended services may be furnished while under the primary physician's care, and that the plan of care will be established and reviewed every 30 days (or more often if condition necessitates).

## 2021-12-30 NOTE — Assessment & Plan Note (Signed)
Creatinine actually improved to 0.92 with a GFR greater than 60.  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

## 2022-01-02 ENCOUNTER — Ambulatory Visit (INDEPENDENT_AMBULATORY_CARE_PROVIDER_SITE_OTHER): Payer: Medicare Other | Admitting: Urology

## 2022-01-02 ENCOUNTER — Encounter: Payer: Self-pay | Admitting: Urology

## 2022-01-02 VITALS — BP 123/64 | HR 73 | Ht 63.0 in | Wt 186.0 lb

## 2022-01-02 DIAGNOSIS — N2889 Other specified disorders of kidney and ureter: Secondary | ICD-10-CM | POA: Diagnosis not present

## 2022-01-02 DIAGNOSIS — N3941 Urge incontinence: Secondary | ICD-10-CM

## 2022-01-02 NOTE — Progress Notes (Signed)
01/02/22 4:04 PM   Hayven Jerline Pain Andres Labrum 01-20-1941 [id number]  Referring provider:  Latanya Maudlin, NP [address] [place],  [place] [address] Chief Complaint  Patient presents with   Follow-up    1 year w/MRI results      HPI: Kristin Mitchell is a 81 y.o.female with a personal history of a renal mass who returns today for a 1 year follow-up with MRI.   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     Given her age and comorbidities as well as absence of information about growth rate, she elected conservative management and follows up today with MRI.  Most recent MRI on 12/19/2021, visualized the 3.9 centimeter exophytic hemorrhagic lesion in the right kidney with increase in size, but showed no change in morphology and no definite contrast enhancement.  I personally reviewed the MRI and disagree with radiological interpretation I measured the lesion at 3.2 cm.   She reports some incontinence.   PMH: Past Medical History:  Diagnosis Date   A-fib [facility])    Arthritis    Breast cancer ([place]) 11/26/2014   Cancer ([place])    breast   Intermittent palpitations    in early am    Surgical History: Past Surgical History:  Procedure Laterality Date   ABDOMINAL HYSTERECTOMY     CATARACT EXTRACTION W/PHACO Left 11/26/2014   Procedure: CATARACT EXTRACTION PHACO AND INTRAOCULAR LENS PLACEMENT ([place]);  Surgeon: Estill Cotta, MD;  Location: [facility] ORS;  Service: Ophthalmology;  Laterality: Left;  US:01:24 AP:26.6 CDE:40.16 PACk KPT:[id number] H   CHOLECYSTECTOMY     EYE SURGERY     MASTECTOMY     bilateral    Home Medications:  Allergies as of 01/02/2022       Reactions   Penicillins Rash        Medication List        Accurate as of January 02, 2022 11:59 PM. If you have any questions, ask your nurse or  doctor.          acetaminophen 500 MG tablet Commonly known as: TYLENOL Take 500 mg by mouth every 6 (six) hours as needed.   alendronate 70 MG tablet Commonly known as: FOSAMAX Take with a full glass of water on an empty stomach. What changed:  how much to take how to take this when to take this   atorvastatin 80 MG tablet Commonly known as: LIPITOR Take 1 tablet (80 mg total) by mouth daily.   Calcium 600+D 600-20 MG-MCG Tabs Generic drug: Calcium Carb-Cholecalciferol Take 1 tablet by mouth daily.   cyanocobalamin 1000 MCG tablet Commonly known as: VITAMIN B12 Take 1,000 mcg by mouth daily.   metoprolol succinate 50 MG 24 hr tablet Commonly known as: TOPROL-XL Take 50 mg by mouth daily.   sodium chloride 1 g tablet Take 1 g by mouth 2 (two) times daily.   Vitamin D3 50 MCG (2000 UT) Tabs Take 1 tablet by mouth daily.   warfarin 2.5 MG tablet Commonly known as: Coumadin Take 1 tablet (2.5 mg total) by mouth daily.        Allergies:  Allergies  Allergen Reactions   Penicillins Rash    Family History: Family History  Problem Relation Age of Onset   Parkinson's disease Mother    Hodgkin's lymphoma Father  Prostate cancer Neg Hx    Bladder Cancer Neg Hx    Kidney cancer Neg Hx     Social History:  reports that she has never smoked. She has never used smokeless tobacco. She reports that she does not drink alcohol and does not use drugs.   Physical Exam: BP 123/64   Pulse 73   Ht '5\' 3"'$  (1.6 m)   Wt 186 lb (84.4 kg)   BMI 32.95 kg/m   Constitutional:  Alert and oriented, No acute distress. HEENT: [place] AT, moist mucus membranes.  Trachea midline, no masses. Cardiovascular: No clubbing, cyanosis, or edema. Respiratory: Normal respiratory effort, no increased work of breathing. Skin: No rashes, bruises or suspicious lesions. Neurologic: Grossly intact, no focal deficits, moving all 4 extremities. Psychiatric: Normal mood and affect.  Laboratory  Data:  Lab Results  Component Value Date   CREATININE 0.92 12/30/2021   Lab Results  Component Value Date   HGBA1C 5.2 12/28/2021    Urinalysis   Pertinent Imaging: CLINICAL DATA:  Follow-up right renal mass.   EXAM: MRI ABDOMEN WITHOUT AND WITH CONTRAST   TECHNIQUE: Multiplanar multisequence MR imaging of the abdomen was performed both before and after the administration of intravenous contrast.   CONTRAST:  7.52m GADAVIST GADOBUTROL 1 MMOL/ML IV SOLN   COMPARISON:  12/05/2020   FINDINGS: Lower chest: No acute findings.   Hepatobiliary: No hepatic masses identified. Prior cholecystectomy. No evidence of biliary obstruction.   Pancreas:  No mass or inflammatory changes.   Spleen:  Within normal limits in size and appearance.   Adrenals/Urinary Tract: The adrenal glands and left kidney are normal in appearance. An exophytic subcapsular lesion is again seen arising from the lateral upper pole of the right kidney which has increased in size since previous study, currently measuring 3.9 x 3.1 cm, compared to 3.1 by 2.8 cm previously. This lesion continues to show heterogeneous T1 hyperintense signal consistent with hemorrhage, however when allowing for mild motion artifact on dynamic subtraction imaging, there is no convincing evidence of internal contrast enhancement. No evidence of hydronephrosis.   Stomach/Bowel: Unremarkable.   Vascular/Lymphatic: No pathologically enlarged lymph nodes identified. No acute vascular findings.   Other:  None.   Musculoskeletal:  No suspicious bone lesions identified.   IMPRESSION: 3.9 cm exophytic hemorrhagic lesion in the right kidney has increased in size, but shows no change in morphology, and no definite contrast enhancement. This most closely matches criteria for an indeterminate but probably benign Bosniak category 52F cystic lesion. Consider continued follow-up by abdomen MRI without and with contrast in 6 months.  This recommendation follows ACR consensus guidelines: Management of the Incidental Renal Mass on CT: A White Paper of the ACR Incidental Findings Committee. J Am Coll Radiol [phone]   No evidence of abdominal metastatic disease or other acute findings.     Electronically Signed   By: JMarlaine HindM.D.   On: 12/20/2021 11:25   I have personally reviewed the images and Disagree with radiologist interpretation. Measured at 3.2c  \m.   Assessment & Plan:   1. 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 - In light of recent comorbidity will continue conservative management with observation which is very reasonable.  2. Urge incontinence - Continue behavioral modification with avoidance of beverages 4 hours before bedtime.   Return in about 6 months (around 07/05/2022) for 636mo/MRI.  I,Guy Sandiferrandon,acting as a  scribe for Hollice Espy, MD.,have documented all relevant documentation on the behalf of Hollice Espy, MD,as directed by  Hollice Espy, MD while in the presence of Hollice Espy, MD.  I have reviewed the above documentation for accuracy and completeness, and I agree with the above.   Hollice Espy, MD   [facility] [address], [place], [place] [address] [phone]

## 2022-01-15 ENCOUNTER — Emergency Department: Payer: Medicare Other

## 2022-01-15 ENCOUNTER — Inpatient Hospital Stay: Payer: Medicare Other

## 2022-01-15 ENCOUNTER — Observation Stay
Admission: EM | Admit: 2022-01-15 | Discharge: 2022-01-17 | Disposition: A | Discharge status: Home Health | Payer: Medicare Other | Attending: Internal Medicine | Admitting: Internal Medicine

## 2022-01-15 ENCOUNTER — Other Ambulatory Visit: Payer: Self-pay

## 2022-01-15 DIAGNOSIS — A419 Sepsis, unspecified organism: Secondary | ICD-10-CM | POA: Diagnosis present

## 2022-01-15 DIAGNOSIS — G459 Transient cerebral ischemic attack, unspecified: Secondary | ICD-10-CM | POA: Diagnosis present

## 2022-01-15 DIAGNOSIS — S0990XA Unspecified injury of head, initial encounter: Secondary | ICD-10-CM | POA: Insufficient documentation

## 2022-01-15 DIAGNOSIS — M6281 Muscle weakness (generalized): Secondary | ICD-10-CM | POA: Diagnosis not present

## 2022-01-15 DIAGNOSIS — Z8673 Personal history of transient ischemic attack (TIA), and cerebral infarction without residual deficits: Secondary | ICD-10-CM | POA: Diagnosis not present

## 2022-01-15 DIAGNOSIS — R2689 Other abnormalities of gait and mobility: Secondary | ICD-10-CM | POA: Insufficient documentation

## 2022-01-15 DIAGNOSIS — I5A Non-ischemic myocardial injury (non-traumatic): Secondary | ICD-10-CM | POA: Diagnosis not present

## 2022-01-15 DIAGNOSIS — R7401 Elevation of levels of liver transaminase levels: Secondary | ICD-10-CM | POA: Diagnosis not present

## 2022-01-15 DIAGNOSIS — Z79899 Other long term (current) drug therapy: Secondary | ICD-10-CM | POA: Diagnosis not present

## 2022-01-15 DIAGNOSIS — N2889 Other specified disorders of kidney and ureter: Secondary | ICD-10-CM

## 2022-01-15 DIAGNOSIS — N390 Urinary tract infection, site not specified: Secondary | ICD-10-CM | POA: Diagnosis not present

## 2022-01-15 DIAGNOSIS — I130 Hypertensive heart and chronic kidney disease with heart failure and stage 1 through stage 4 chronic kidney disease, or unspecified chronic kidney disease: Secondary | ICD-10-CM | POA: Insufficient documentation

## 2022-01-15 DIAGNOSIS — R2681 Unsteadiness on feet: Secondary | ICD-10-CM | POA: Insufficient documentation

## 2022-01-15 DIAGNOSIS — Z7901 Long term (current) use of anticoagulants: Secondary | ICD-10-CM | POA: Diagnosis not present

## 2022-01-15 DIAGNOSIS — R7989 Other specified abnormal findings of blood chemistry: Secondary | ICD-10-CM

## 2022-01-15 DIAGNOSIS — I5032 Chronic diastolic (congestive) heart failure: Secondary | ICD-10-CM | POA: Diagnosis not present

## 2022-01-15 DIAGNOSIS — E876 Hypokalemia: Secondary | ICD-10-CM

## 2022-01-15 DIAGNOSIS — Z6834 Body mass index (BMI) 34.0-34.9, adult: Secondary | ICD-10-CM | POA: Insufficient documentation

## 2022-01-15 DIAGNOSIS — Z853 Personal history of malignant neoplasm of breast: Secondary | ICD-10-CM | POA: Insufficient documentation

## 2022-01-15 DIAGNOSIS — E669 Obesity, unspecified: Secondary | ICD-10-CM | POA: Insufficient documentation

## 2022-01-15 DIAGNOSIS — B9620 Unspecified Escherichia coli [E. coli] as the cause of diseases classified elsewhere: Secondary | ICD-10-CM | POA: Diagnosis not present

## 2022-01-15 DIAGNOSIS — W19XXXA Unspecified fall, initial encounter: Secondary | ICD-10-CM

## 2022-01-15 DIAGNOSIS — I10 Essential (primary) hypertension: Secondary | ICD-10-CM

## 2022-01-15 DIAGNOSIS — E7800 Pure hypercholesterolemia, unspecified: Secondary | ICD-10-CM | POA: Diagnosis not present

## 2022-01-15 DIAGNOSIS — I4891 Unspecified atrial fibrillation: Secondary | ICD-10-CM | POA: Diagnosis not present

## 2022-01-15 DIAGNOSIS — I5043 Acute on chronic combined systolic (congestive) and diastolic (congestive) heart failure: Secondary | ICD-10-CM | POA: Diagnosis present

## 2022-01-15 DIAGNOSIS — Y92009 Unspecified place in unspecified non-institutional (private) residence as the place of occurrence of the external cause: Secondary | ICD-10-CM

## 2022-01-15 DIAGNOSIS — R000 Tachycardia, unspecified: Secondary | ICD-10-CM | POA: Diagnosis present

## 2022-01-15 DIAGNOSIS — N3090 Cystitis, unspecified without hematuria: Secondary | ICD-10-CM

## 2022-01-15 DIAGNOSIS — N182 Chronic kidney disease, stage 2 (mild): Secondary | ICD-10-CM | POA: Diagnosis not present

## 2022-01-15 DIAGNOSIS — Z20822 Contact with and (suspected) exposure to covid-19: Secondary | ICD-10-CM | POA: Diagnosis not present

## 2022-01-15 LAB — COMPREHENSIVE METABOLIC PANEL
ALT: 64 U/L — ABNORMAL HIGH (ref 0–44)
AST: 77 U/L — ABNORMAL HIGH (ref 15–41)
Albumin: 3 g/dL — ABNORMAL LOW (ref 3.5–5.0)
Alkaline Phosphatase: 280 U/L — ABNORMAL HIGH (ref 38–126)
Anion gap: 11 (ref 5–15)
BUN: 16 mg/dL (ref 8–23)
CO2: 22 mmol/L (ref 22–32)
Calcium: 8.8 mg/dL — ABNORMAL LOW (ref 8.9–10.3)
Chloride: 100 mmol/L (ref 98–111)
Creatinine, Ser: 1.15 mg/dL — ABNORMAL HIGH (ref 0.44–1.00)
GFR, Estimated: 48 mL/min — ABNORMAL LOW (ref >60)
Glucose, Bld: 103 mg/dL — ABNORMAL HIGH (ref 70–99)
Potassium: 2.9 mmol/L — ABNORMAL LOW (ref 3.5–5.1)
Sodium: 133 mmol/L — ABNORMAL LOW (ref 135–145)
Total Bilirubin: 2 mg/dL — ABNORMAL HIGH (ref 0.3–1.2)
Total Protein: 6.3 g/dL — ABNORMAL LOW (ref 6.5–8.1)

## 2022-01-15 LAB — CBC WITH DIFFERENTIAL/PLATELET
Abs Immature Granulocytes: 0.04 10*3/uL (ref 0.00–0.07)
Basophils Absolute: 0 10*3/uL (ref 0.0–0.1)
Basophils Relative: 0 %
Eosinophils Absolute: 0.4 10*3/uL (ref 0.0–0.5)
Eosinophils Relative: 5 %
HCT: 32.5 % — ABNORMAL LOW (ref 36.0–46.0)
Hemoglobin: 10.6 g/dL — ABNORMAL LOW (ref 12.0–15.0)
Immature Granulocytes: 1 %
Lymphocytes Relative: 10 %
Lymphs Abs: 0.7 10*3/uL (ref 0.7–4.0)
MCH: 29.9 pg (ref 26.0–34.0)
MCHC: 32.6 g/dL (ref 30.0–36.0)
MCV: 91.5 fL (ref 80.0–100.0)
Monocytes Absolute: 0.9 10*3/uL (ref 0.1–1.0)
Monocytes Relative: 13 %
Neutro Abs: 5.4 10*3/uL (ref 1.7–7.7)
Neutrophils Relative %: 71 %
Platelets: 151 10*3/uL (ref 150–400)
RBC: 3.55 MIL/uL — ABNORMAL LOW (ref 3.87–5.11)
RDW: 12.7 % (ref 11.5–15.5)
WBC: 7.5 10*3/uL (ref 4.0–10.5)
nRBC: 0 % (ref 0.0–0.2)

## 2022-01-15 LAB — URINALYSIS, ROUTINE W REFLEX MICROSCOPIC
Bilirubin Urine: NEGATIVE
Glucose, UA: NEGATIVE mg/dL
Ketones, ur: NEGATIVE mg/dL
Nitrite: NEGATIVE
Protein, ur: NEGATIVE mg/dL
Specific Gravity, Urine: 1.003 — ABNORMAL LOW (ref 1.005–1.030)
Squamous Epithelial / HPF: NONE SEEN (ref 0–5)
pH: 6 (ref 5.0–8.0)

## 2022-01-15 LAB — PROTIME-INR
INR: 2.1 — ABNORMAL HIGH (ref 0.8–1.2)
Prothrombin Time: 23.7 seconds — ABNORMAL HIGH (ref 11.4–15.2)

## 2022-01-15 LAB — LACTIC ACID, PLASMA
Lactic Acid, Venous: 1.8 mmol/L (ref 0.5–1.9)
Lactic Acid, Venous: 1.6 mmol/L (ref 0.5–1.9)

## 2022-01-15 LAB — HEPATITIS PANEL, ACUTE
HCV Ab: NONREACTIVE
Hep A IgM: NONREACTIVE
Hep B C IgM: NONREACTIVE
Hepatitis B Surface Ag: NONREACTIVE

## 2022-01-15 LAB — TROPONIN I (HIGH SENSITIVITY)
Troponin I (High Sensitivity): 24 ng/L — ABNORMAL HIGH (ref <18)
Troponin I (High Sensitivity): 63 ng/L — ABNORMAL HIGH (ref <18)
Troponin I (High Sensitivity): 78 ng/L — ABNORMAL HIGH (ref <18)
Troponin I (High Sensitivity): 60 ng/L — ABNORMAL HIGH (ref <18)

## 2022-01-15 LAB — BRAIN NATRIURETIC PEPTIDE: B Natriuretic Peptide: 659.3 pg/mL — ABNORMAL HIGH (ref 0.0–100.0)

## 2022-01-15 LAB — MAGNESIUM: Magnesium: 1.4 mg/dL — ABNORMAL LOW (ref 1.7–2.4)

## 2022-01-15 LAB — LIPASE, BLOOD: Lipase: 29 U/L (ref 11–51)

## 2022-01-15 LAB — PROCALCITONIN: Procalcitonin: 0.54 ng/mL

## 2022-01-15 LAB — SARS CORONAVIRUS 2 BY RT PCR: SARS Coronavirus 2 by RT PCR: NEGATIVE

## 2022-01-15 MED ORDER — POTASSIUM CHLORIDE 10 MEQ/100ML IV SOLN
10.0000 meq | INTRAVENOUS | Status: AC
  Administered 2022-01-15: 10 meq via INTRAVENOUS
  Filled 2022-01-15: qty 100

## 2022-01-15 MED ORDER — OYSTER SHELL CALCIUM/D3 500-5 MG-MCG PO TABS
1.0000 | ORAL_TABLET | Freq: Every day | ORAL | Status: DC
  Administered 2022-01-16 – 2022-01-17 (×2): 1 via ORAL
  Filled 2022-01-15 (×2): qty 1

## 2022-01-15 MED ORDER — DILTIAZEM HCL-DEXTROSE 125-5 MG/125ML-% IV SOLN (PREMIX): 5.0000 mg/h | INTRAVENOUS | Status: DC

## 2022-01-15 MED ORDER — POTASSIUM CHLORIDE IN NACL 40-0.9 MEQ/L-% IV SOLN
INTRAVENOUS | Status: DC
  Administered 2022-01-15: 250 mL/h via INTRAVENOUS
  Filled 2022-01-15: qty 1000

## 2022-01-15 MED ORDER — SODIUM CHLORIDE 0.9 % IV SOLN
1.0000 g | INTRAVENOUS | Status: DC
  Administered 2022-01-16 – 2022-01-17 (×2): 1 g via INTRAVENOUS
  Filled 2022-01-15 (×2): qty 10

## 2022-01-15 MED ORDER — ONDANSETRON HCL 4 MG/2ML IJ SOLN: 4.0000 mg | Freq: Three times a day (TID) | INTRAMUSCULAR | Status: DC | PRN

## 2022-01-15 MED ORDER — VITAMIN B-12 1000 MCG PO TABS
1000.0000 ug | ORAL_TABLET | Freq: Every day | ORAL | Status: DC
  Administered 2022-01-15 – 2022-01-17 (×3): 1000 ug via ORAL
  Filled 2022-01-15 (×3): qty 1

## 2022-01-15 MED ORDER — WARFARIN SODIUM 2.5 MG PO TABS
2.5000 mg | ORAL_TABLET | Freq: Once | ORAL | Status: AC
Start: 2022-01-15 — End: 2022-01-15
  Administered 2022-01-15: 2.5 mg via ORAL
  Filled 2022-01-15 (×2): qty 1

## 2022-01-15 MED ORDER — POTASSIUM CHLORIDE CRYS ER 20 MEQ PO TBCR
40.0000 meq | EXTENDED_RELEASE_TABLET | ORAL | Status: AC
  Administered 2022-01-15 (×2): 40 meq via ORAL
  Filled 2022-01-15 (×2): qty 2

## 2022-01-15 MED ORDER — WARFARIN - PHARMACIST DOSING INPATIENT
Freq: Every day | Status: DC
  Filled 2022-01-15: qty 1

## 2022-01-15 MED ORDER — POTASSIUM CHLORIDE 10 MEQ/100ML IV SOLN: 10.0000 meq | INTRAVENOUS | Status: DC

## 2022-01-15 MED ORDER — MAGNESIUM SULFATE 2 GM/50ML IV SOLN
2.0000 g | Freq: Once | INTRAVENOUS | Status: AC
  Administered 2022-01-15: 2 g via INTRAVENOUS
  Filled 2022-01-15: qty 50

## 2022-01-15 MED ORDER — ATORVASTATIN CALCIUM 20 MG PO TABS: 80.0000 mg | ORAL_TABLET | Freq: Every day | ORAL | Status: DC

## 2022-01-15 MED ORDER — METOPROLOL SUCCINATE ER 50 MG PO TB24
50.0000 mg | ORAL_TABLET | Freq: Every day | ORAL | Status: DC
  Administered 2022-01-15 – 2022-01-17 (×3): 50 mg via ORAL
  Filled 2022-01-15 (×3): qty 1

## 2022-01-15 MED ORDER — HYDRALAZINE HCL 20 MG/ML IJ SOLN: 5.0000 mg | INTRAMUSCULAR | Status: DC | PRN

## 2022-01-15 MED ORDER — VITAMIN D3 25 MCG (1000 UNIT) PO TABS
2000.0000 [IU] | ORAL_TABLET | Freq: Every day | ORAL | Status: DC
  Administered 2022-01-15 – 2022-01-17 (×3): 2000 [IU] via ORAL
  Filled 2022-01-15 (×5): qty 2

## 2022-01-15 MED ORDER — SODIUM CHLORIDE 1 G PO TABS
1.0000 g | ORAL_TABLET | Freq: Two times a day (BID) | ORAL | Status: DC
Start: 2022-01-15 — End: 2022-01-17
  Administered 2022-01-15 – 2022-01-17 (×5): 1 g via ORAL
  Filled 2022-01-15 (×6): qty 1

## 2022-01-15 MED ORDER — SODIUM CHLORIDE 0.9 % IV SOLN
1.0000 g | Freq: Once | INTRAVENOUS | Status: AC
  Administered 2022-01-15: 1 g via INTRAVENOUS

## 2022-01-15 NOTE — Assessment & Plan Note (Signed)
See above

## 2022-01-15 NOTE — Assessment & Plan Note (Signed)
This is a old issue. -Follow-up with PCP

## 2022-01-15 NOTE — Assessment & Plan Note (Signed)
-   Hold Lipitor due to abnormal liver function -Patient is on Coumadin for A-fib

## 2022-01-15 NOTE — Care Management CC44 (Signed)
Condition Code 44 Documentation Completed  Patient Details  Name: Kristin Mitchell MRN: [id number] Date of Birth: 18-May-1940   Condition Code 44 given:  Yes Patient signature on Condition Code 44 notice:  Yes Documentation of 2 MD's agreement:  Yes Code 44 added to claim:  Yes    Shelbie Hutching, RN 01/15/2022, 4:15 PM

## 2022-01-15 NOTE — Assessment & Plan Note (Signed)
Potassium 2.9 -Repleted potassium

## 2022-01-15 NOTE — ED Provider Notes (Signed)
[facility] Provider Note    Event Date/Time   First MD Initiated Contact with Patient 01/15/22 [id number]     (approximate)   History   Tachycardia and Fall   HPI  Kristin Mitchell is a 81 y.o. female who presents to the ED for evaluation of Tachycardia and Fall   I reviewed medical DC summary from 8/22 where patient was evaluated for possible TIA with slurred speech.  She has a history of paroxysmal A-fib on Coumadin, CKD.   Patient presents to the ED for evaluation of intermittent episodes of dizziness and falling over the past couple nights.  She reports she has fallen each night in the past 2 nights without head trauma or syncope.  Reports that she "just falls."  She reports feeling episodes of dizziness without syncope.  Reports that she has been "laid up in her recliner" for the past couple days.  Denies any chest pain, fever, abdominal pain, diarrhea.  Reports 1 episode of emesis this evening.  Physical Exam   Triage Vital Signs: ED Triage Vitals [01/15/22 0503]  Enc Vitals Group     BP      Pulse      Resp      Temp      Temp src      SpO2      Weight      Height      Head Circumference      Peak Flow      Pain Score 0     Pain Loc      Pain Edu?      Excl. in [place]?     Most recent vital signs: Vitals:   01/15/22 0507 01/15/22 0600  BP: 136/87 133/68  Pulse: (!) 135 80  Resp: (!) 24 16  Temp: 98 F (36.7 C)   SpO2: 99% 99%    General: Awake, no distress.  Looks well, pleasant and conversational. CV:  Good peripheral perfusion.  Tachycardic and irregular Resp:  Normal effort.  Abd:  No distention.  Suprapubic tenderness is present but no guarding or peritoneal features.  Upper abdomen is benign MSK:  No deformity noted.  No sign of trauma to palpation of the extremities or back, no tenderness Neuro:  No focal deficits appreciated. Cranial nerves II through XII intact 5/5 strength and sensation in all 4  extremities Other:     ED Results / Procedures / Treatments   Labs (all labs ordered are listed, but only abnormal results are displayed) Labs Reviewed  MAGNESIUM - Abnormal; Notable for the following components:      Result Value   Magnesium 1.4 (*)    All other components within normal limits  URINALYSIS, ROUTINE W REFLEX MICROSCOPIC - Abnormal; Notable for the following components:   Color, Urine YELLOW (*)    APPearance HAZY (*)    Specific Gravity, Urine 1.003 (*)    Hgb urine dipstick SMALL (*)    Leukocytes,Ua MODERATE (*)    Bacteria, UA MANY (*)    All other components within normal limits  COMPREHENSIVE METABOLIC PANEL - Abnormal; Notable for the following components:   Sodium 133 (*)    Potassium 2.9 (*)    Glucose, Bld 103 (*)    Creatinine, Ser 1.15 (*)    Calcium 8.8 (*)    Total Protein 6.3 (*)    Albumin 3.0 (*)    AST 77 (*)    ALT 64 (*)  Alkaline Phosphatase 280 (*)    Total Bilirubin 2.0 (*)    GFR, Estimated 48 (*)    All other components within normal limits  CBC WITH DIFFERENTIAL/PLATELET - Abnormal; Notable for the following components:   RBC 3.55 (*)    Hemoglobin 10.6 (*)    HCT 32.5 (*)    All other components within normal limits  BRAIN NATRIURETIC PEPTIDE - Abnormal; Notable for the following components:   B Natriuretic Peptide 659.3 (*)    All other components within normal limits  PROTIME-INR - Abnormal; Notable for the following components:   Prothrombin Time 23.7 (*)    INR 2.1 (*)    All other components within normal limits  TROPONIN I (HIGH SENSITIVITY) - Abnormal; Notable for the following components:   Troponin I (High Sensitivity) 24 (*)    All other components within normal limits  SARS CORONAVIRUS 2 BY RT PCR  URINE CULTURE  LIPASE, BLOOD  TROPONIN I (HIGH SENSITIVITY)    EKG A-fib with RVR with a rate of 139 bpm.  Normal axis.  Left bundle.  No STEMI by [place] criteria.  RADIOLOGY CXR interpreted by me without  evidence of acute cardiopulmonary pathology.  Official radiology report(s): CT HEAD WO CONTRAST (5MM)  Result Date: 01/15/2022 CLINICAL DATA:  Minor head trauma.  Weakness EXAM: CT HEAD WITHOUT CONTRAST TECHNIQUE: Contiguous axial images were obtained from the base of the skull through the vertex without intravenous contrast. RADIATION DOSE REDUCTION: This exam was performed according to the departmental dose-optimization program which includes automated exposure control, adjustment of the mA and/or kV according to patient size and/or use of iterative reconstruction technique. COMPARISON:  12/29/2021 FINDINGS: Brain: No evidence of acute infarction, hemorrhage, hydrocephalus, extra-axial collection or mass lesion/mass effect. Generalized cerebral volume loss. 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 Vascular: No hyperdense vessel or unexpected calcification. Skull: Normal. Negative for fracture or focal lesion. Sinuses/Orbits: No acute finding. IMPRESSION: No acute finding.  No evidence of intracranial injury. Electronically Signed   By: Jorje Guild M.D.   On: 01/15/2022 05:44   DG Chest Portable 1 View  Result Date: 01/15/2022 CLINICAL DATA:  Fall. Atrial fibrillation with rapid ventricular response EXAM: PORTABLE CHEST 1 VIEW COMPARISON:  02/20/2020 FINDINGS: Artifact from EKG leads. Normal heart size and mediastinal contours for portable technique. No acute infiltrate or edema. No effusion or pneumothorax. No acute osseous findings. Postoperative chest wall/axillae. IMPRESSION: No evidence of acute cardiopulmonary disease. Electronically Signed   By: Jorje Guild M.D.   On: 01/15/2022 05:30    PROCEDURES and INTERVENTIONS:  .1-3 Lead EKG Interpretation  Performed by: Vladimir Crofts, MD Authorized by: Vladimir Crofts, MD     Interpretation: abnormal     ECG rate:  134   ECG rate assessment: tachycardic     Rhythm: atrial fibrillation     Ectopy: none     Conduction:  normal   .Critical Care  Performed by: Vladimir Crofts, MD Authorized by: Vladimir Crofts, MD   Critical care provider statement:    Critical care time (minutes):  30   Critical care time was exclusive of:  Separately billable procedures and treating other patients   Critical care was necessary to treat or prevent imminent or life-threatening deterioration of the following conditions:  Circulatory failure   Critical care was time spent personally by me on the following activities:  Development of treatment plan with patient or surrogate, discussions with consultants, evaluation of patient's response to treatment,  examination of patient, ordering and review of laboratory studies, ordering and review of radiographic studies, ordering and performing treatments and interventions, pulse oximetry, re-evaluation of patient's condition and review of old charts   Medications  diltiazem (CARDIZEM) 125 mg in dextrose 5% 125 mL (1 mg/mL) infusion (0 mg/hr Intravenous Hold 01/15/22 0606)  magnesium sulfate IVPB 2 g 50 mL (2 g Intravenous New Bag/Given 01/15/22 0638)  0.9 % NaCl with KCl 40 mEq / L  infusion (250 mL/hr Intravenous New Bag/Given 01/15/22 0637)  cefTRIAXone (ROCEPHIN) 1 g in sodium chloride 0.9 % 100 mL IVPB (0 g Intravenous Stopped 01/15/22 0639)     IMPRESSION / MDM / [place] / ED COURSE  I reviewed the triage vital signs and the nursing notes.  Differential diagnosis includes, but is not limited to, sepsis, stroke, seizure, ACS, if with RVR, ICH  {Patient presents with symptoms of an acute illness or injury that is potentially life-threatening.  81 year old female presents to the ED after a fall, found of evidence of A-fib RVR, UTI and transaminitis requiring medical admission.  She looks systemically well on arrival without signs of significant trauma and without neurologic or vascular deficits.  CT head is reassuring as well as her CXR, presents with A-fib with RVR, resolving to a  normal sinus rhythm and she remains hemodynamically stable.  Blood work with no leukocytosis and I doubt sepsis considering how well she looks.  Low magnesium and potassium are repleted IV, cath urine with signs of cystitis and will be sent for culture.  She is started on ceftriaxone.  Around the time of admission, her metabolic panel returns with transaminitis for which a RUQ ultrasound is pending at the time of admission.  Clinical Course as of 01/15/22 0647  Thu Jan 15, 2022  0602 Reassessed and reexamined.  No RUQ tenderness [DS]  0603 Has converted to a normal sinus rhythm with a rate in the 70s, left bundle remains [DS]    Clinical Course User Index [DS] Vladimir Crofts, MD     FINAL CLINICAL IMPRESSION(S) / ED DIAGNOSES   Final diagnoses:  Atrial fibrillation with RVR ([place])  Fall, initial encounter  Cystitis  Transaminitis     Rx / DC Orders   ED Discharge Orders     None        Note:  This document was prepared using Dragon voice recognition software and may include unintentional dictation errors.   Vladimir Crofts, MD 01/15/22 [phone]

## 2022-01-15 NOTE — Consult Note (Signed)
ANTICOAGULATION CONSULT NOTE - Initial Consult  Pharmacy Consult for Warfarin Indication: atrial fibrillation  Allergies  Allergen Reactions   Penicillins Rash    Patient Measurements:    Vital Signs: Temp: 98.2 F (36.8 C) (09/07 0947) Temp Source: Oral (09/07 0947) BP: 133/58 (09/07 0700) Pulse Rate: 76 (09/07 0700)  Labs: Recent Labs    01/15/22 0508 01/15/22 0812  HGB 10.6*  --   HCT 32.5*  --   PLT 151  --   LABPROT 23.7*  --   INR 2.1*  --   CREATININE 1.15*  --   TROPONINIHS 24* 63*    Estimated Creatinine Clearance: 39.5 mL/min (A) (by C-G formula based on SCr of 1.15 mg/dL (H)).   Medical History: Past Medical History:  Diagnosis Date   A-fib [facility])    Arthritis    Breast cancer ([place]) 11/26/2014   Cancer ([place])    breast   Intermittent palpitations    in early am    Medications:  Warfarin 2.5 mg daily   Assessment: 81 y.o. female who presents to the ED for evaluation of Tachycardia and Fall. Pharmacy consulted to manage home warfarin while inpatient for afib. CBC stable. DDI: Ceftriaxone (increase INR)  Date INR Warfarin Dose  9/7 2.1 2.5 mg          Goal of Therapy:  INR 2-3 Monitor platelets by anticoagulation protocol: Yes   Plan:  INR is therapeutic. Will continue home warfarin dose at 2.5 mg x 1 tonight. Daily INR. CBC at least every 3 days.   Oswald Hillock, PharmD, BCPS 01/15/2022,10:43 AM

## 2022-01-15 NOTE — Assessment & Plan Note (Signed)
-  IV hydralazine as needed -Metoprolol

## 2022-01-15 NOTE — Assessment & Plan Note (Signed)
Myocardial injury due to A-fib with RVR and sepsis. Troponin level 24 -->  63 -Trend troponin -Check FLP -A1c was 5.2 on 12/28/2021, will not repeat today -Hold Lipitor due to abnormal liver function - will not start ASA since pt is on coumadin.

## 2022-01-15 NOTE — ED Triage Notes (Signed)
Pt presents to ER via ems from home with c/o weakness, and a-fib.  Per ems, pt has been feeling weak for last couple of nights.  Tonight she woke up to go to bathroom, began to feel dizzy and fell.  Denies hitting head or LOC.  Pt does take warfarin at home.  On arrival, pts HR was between 180-190 in afib w/rvr.  Pt given total of 20 mg of dilt IV.  Pt A&O x4 on arrival to ER.  NAD noted.

## 2022-01-15 NOTE — Assessment & Plan Note (Signed)
-   Hold Lipitor due to abnormal liver function

## 2022-01-15 NOTE — Assessment & Plan Note (Signed)
Sepsis due to UTI: Patient meets criteria for sepsis with tachycardia and RR 24.  Pending lactic acid level. -IV Rocephin -For urine culture -Trend lactic acid level -Check procalcitonin level -IV fluid: Will not give IV fluid unless lactic acid is significantly elevated due to elevated BNP and history of CHF

## 2022-01-15 NOTE — Assessment & Plan Note (Signed)
-   Fall precaution -PT/OT 

## 2022-01-15 NOTE — H&P (Addendum)
History and Physical    Kristin Mitchell TDD:[id number] DOB: 1940/07/29 DOA: 01/15/2022  Referring MD/NP/PA:   PCP: Latanya Maudlin, NP   Patient coming from:  The patient is coming from home.    Chief Complaint: weakness, fall, increased urinary frequency  HPI: Kristin Mitchell is a 81 y.o. female with medical history significant of hypertension, hyperlipidemia, TIA, breast cancer (s/p for bilateral mastectomy), atrial fibrillation on Coumadin, CKD-2, dCHF, who presents with weakness, fall, increased urinary frequency.  Patient states that he has generalized weakness in the past several days.  She also has dizziness and lightheadedness.  She fell 3 times in the past several days, no significant injury.  No loss of consciousness.  Denies any head or neck injury.  Patient does not have unilateral numbness, facial droop or slurred speech.  He states that he feels weak in both legs.  No chest pain, cough, shortness breath.  Patient nausea and vomited 1 time last night, which has resolved.  Currently no nausea vomiting, diarrhea or abdominal pain.  No fever or chills.  Patient reports increased urinary frequency recently, no dysuria or burning on urination.   Data reviewed independently and ED Course: pt was found to have WBC 7.5, positive urinalysis (hazy appearance, moderate amount of leukocyte, many bacteria, WBC 25-50), negative COVID PCR, BNP 659, INR 2.1, troponin level 24, slightly worsening renal function, potassium 2.9, magnesium 1.4, abnormal liver function (ALP 280, AST 77, ALT 64, total bilirubin 2.0), temperature normal, blood pressure 133/58, A fib with RVR with heart rate 135, RR 24, oxygen saturation 95% on room air.  Chest x-ray negative.  CT of head is negative for acute intracranial abnormalities.  Patient is placed in progressive bed for obs.  EKG: I have personally reviewed.  Atrial fibrillation with RVR, heart rate of 139, QTc 489, LAD.  US-RUQ: 1. No focal liver  abnormality. 2. No significant biliary prominence considering prior cholecystectomy and age. 3. Left renal mass. Similar measurements were obtained in 2021. No definite enhancement on MRI and no color flow registration on ultrasound. Agree with recommendation in the MRI report for follow-up MRI without and with contrast in 6 months (from the 12/19/2021 MRI).     Review of Systems:   General: no fevers, chills, no body weight gain, has fatigue HEENT: no blurry vision, hearing changes or sore throat Respiratory: no dyspnea, coughing, wheezing CV: no chest pain, no palpitations GI: no nausea, vomiting, abdominal pain, diarrhea, constipation GU: no dysuria, burning on urination, has increased urinary frequency, no hematuria  Ext: has trace leg edema Neuro: no unilateral weakness, numbness, or tingling, no vision change or hearing loss. Has fall and dizziness Skin: no rash, no skin tear. MSK: No muscle spasm, no deformity, no limitation of range of movement in spin Heme: No easy bruising.  Travel history: No recent long distant travel.   Allergy:  Allergies  Allergen Reactions   Penicillins Rash    Past Medical History:  Diagnosis Date   A-fib ([place])    Arthritis    Breast cancer ([place]) 11/26/2014   Cancer ([place])    breast   Intermittent palpitations    in early am    Past Surgical History:  Procedure Laterality Date   ABDOMINAL HYSTERECTOMY     CATARACT EXTRACTION W/PHACO Left 11/26/2014   Procedure: CATARACT EXTRACTION PHACO AND INTRAOCULAR LENS PLACEMENT ([place]);  Surgeon: Estill Cotta, MD;  Location: [facility] ORS;  Service: Ophthalmology;  Laterality: Left;  US:01:24 AP:26.6 CDE:40.16 PACk WCB:[id number] H  CHOLECYSTECTOMY     EYE SURGERY     MASTECTOMY     bilateral    Social History:  reports that she has never smoked. She has never used smokeless tobacco. She reports that she does not drink alcohol and does not use drugs.  Family History:  Family History   Problem Relation Age of Onset   Parkinson's disease Mother    Hodgkin's lymphoma Father    Prostate cancer Neg Hx    Bladder Cancer Neg Hx    Kidney cancer Neg Hx      Prior to Admission medications   Medication Sig Start Date End Date Taking? Authorizing Provider  acetaminophen (TYLENOL) 500 MG tablet Take 500 mg by mouth every 6 (six) hours as needed.     [provider]  alendronate (FOSAMAX) 70 MG tablet Take with a full glass of water on an empty stomach. Patient taking differently: Take 70 mg by mouth once a week. Take with a full glass of water on an empty stomach. 07/02/20   Lequita Asal, MD  atorvastatin (LIPITOR) 80 MG tablet Take 1 tablet (80 mg total) by mouth daily. 12/30/21   Loletha Grayer, MD  Calcium Carb-Cholecalciferol (CALCIUM 600+D) 600-800 MG-UNIT TABS Take 1 tablet by mouth daily.    [provider]  Cholecalciferol (VITAMIN D3) 50 MCG (2000 UT) TABS Take 1 tablet by mouth daily.    [provider]  metoprolol succinate (TOPROL-XL) 50 MG 24 hr tablet Take 50 mg by mouth daily.    [provider]  sodium chloride 1 g tablet Take 1 g by mouth 2 (two) times daily. 10/30/20   [provider]  vitamin B-12 (CYANOCOBALAMIN) 1000 MCG tablet Take 1,000 mcg by mouth daily.    [provider]  warfarin (COUMADIN) 2.5 MG tablet Take 1 tablet (2.5 mg total) by mouth daily. 12/31/21 01/30/22  Loletha Grayer, MD    Physical Exam: Vitals:   01/15/22 1100 01/15/22 1200 01/15/22 1314 01/15/22 1504  BP: (!) 112/56 (!) 123/58  122/66  Pulse: 79 80  85  Resp: '17 18  18  '$ Temp:    98.7 F (37.1 C)  TempSrc:    Oral  SpO2: 95% 96%  97%  Weight:   85 kg   Height:   '5\' 3"'$  (1.6 m)    General: Not in acute distress HEENT:       Eyes: PERRL, EOMI, no scleral icterus.       ENT: No discharge from the ears and nose, no pharynx injection, no tonsillar enlargement.        Neck: No JVD, no bruit, no mass felt. Heme: No neck  lymph node enlargement. Cardiac: S1/S2, RRR, No murmurs, No gallops or rubs. Respiratory: No rales, wheezing, rhonchi or rubs. GI: Soft, nondistended, nontender, no rebound pain, no organomegaly, BS present. GU: No hematuria Ext:  has trace leg edema bilaterally. 1+DP/PT pulse bilaterally. Musculoskeletal: No joint deformities, No joint redness or warmth, no limitation of ROM in spin. Skin: No rashes.  Neuro: Alert, oriented X3, cranial nerves II-XII grossly intact, moves all extremities normally. Muscle strength 5/5 in all extremities, sensation to light touch intact.  Psych: Patient is not psychotic, no suicidal or hemocidal ideation.  Labs on Admission: I have personally reviewed following labs and imaging studies  CBC: Recent Labs  Lab 01/15/22 0508  WBC 7.5  NEUTROABS 5.4  HGB 10.6*  HCT 32.5*  MCV 91.5  PLT 093   Basic Metabolic Panel:  Recent Labs  Lab 01/15/22 0508  NA 133*  K 2.9*  CL 100  CO2 22  GLUCOSE 103*  BUN 16  CREATININE 1.15*  CALCIUM 8.8*  MG 1.4*   GFR: Estimated Creatinine Clearance: 39.6 mL/min (A) (by C-G formula based on SCr of 1.15 mg/dL (H)). Liver Function Tests: Recent Labs  Lab 01/15/22 0508  AST 77*  ALT 64*  ALKPHOS 280*  BILITOT 2.0*  PROT 6.3*  ALBUMIN 3.0*   Recent Labs  Lab 01/15/22 0508  LIPASE 29   No results for input(s): "AMMONIA" in the last 168 hours. Coagulation Profile: Recent Labs  Lab 01/15/22 0508  INR 2.1*   Cardiac Enzymes: No results for input(s): "CKTOTAL", "CKMB", "CKMBINDEX", "TROPONINI" in the last 168 hours. BNP (last 3 results) No results for input(s): "PROBNP" in the last 8760 hours. HbA1C: No results for input(s): "HGBA1C" in the last 72 hours. CBG: No results for input(s): "GLUCAP" in the last 168 hours. Lipid Profile: No results for input(s): "CHOL", "HDL", "LDLCALC", "TRIG", "CHOLHDL", "LDLDIRECT" in the last 72 hours. Thyroid Function Tests: No results for input(s): "TSH", "T4TOTAL",  "FREET4", "T3FREE", "THYROIDAB" in the last 72 hours. Anemia Panel: No results for input(s): "VITAMINB12", "FOLATE", "FERRITIN", "TIBC", "IRON", "RETICCTPCT" in the last 72 hours. Urine analysis:    Component Value Date/Time   COLORURINE YELLOW (A) 01/15/2022 0518   APPEARANCEUR HAZY (A) 01/15/2022 0518   LABSPEC 1.003 (L) 01/15/2022 0518   PHURINE 6.0 01/15/2022 0518   GLUCOSEU NEGATIVE 01/15/2022 0518   HGBUR SMALL (A) 01/15/2022 0518   BILIRUBINUR NEGATIVE 01/15/2022 0518   KETONESUR NEGATIVE 01/15/2022 0518   PROTEINUR NEGATIVE 01/15/2022 0518   NITRITE NEGATIVE 01/15/2022 0518   LEUKOCYTESUR MODERATE (A) 01/15/2022 0518   Sepsis Labs: '@LABRCNTIP'$ (procalcitonin:4,lacticidven:4) ) Recent Results (from the past 240 hour(s))  SARS Coronavirus 2 by RT PCR (hospital order, performed in [facility] hospital lab) *cepheid single result test* Anterior Nasal Swab     Status: None   Collection Time: 01/15/22  5:18 AM   Specimen: Anterior Nasal Swab  Result Value Ref Range Status   SARS Coronavirus 2 by RT PCR NEGATIVE NEGATIVE Final    Comment: (NOTE) SARS-CoV-2 target nucleic acids are NOT DETECTED.  The SARS-CoV-2 RNA is generally detectable in upper and lower respiratory specimens during the acute phase of infection. The lowest concentration of SARS-CoV-2 viral copies this assay can detect is 250 copies / mL. A negative result does not preclude SARS-CoV-2 infection and should not be used as the sole basis for treatment or other patient management decisions.  A negative result may occur with improper specimen collection / handling, submission of specimen other than nasopharyngeal swab, presence of viral mutation(s) within the areas targeted by this assay, and inadequate number of viral copies (<250 copies / mL). A negative result must be combined with clinical observations, patient history, and epidemiological information.  Fact Sheet for Patients:    https://www.patel.info/  Fact Sheet for Healthcare Providers: https://hall.com/  This test is not yet approved or  cleared by the [place] FDA and has been authorized for detection and/or diagnosis of SARS-CoV-2 by FDA under an Emergency Use Authorization (EUA).  This EUA will remain in effect (meaning this test can be used) for the duration of the COVID-19 declaration under Section 564(b)(1) of the Act, 21 U.S.C. section 360bbb-3(b)(1), unless the authorization is terminated or revoked sooner.  Performed at [facility], [address], [place], [place] [address]      Radiological  Exams on Admission: US ABDOMEN LIMITED RUQ (LIVER/GB)  Result Date: 01/15/2022 CLINICAL DATA:  Transaminitis with history of cholecystectomy and right renal mass. EXAM: ULTRASOUND ABDOMEN LIMITED RIGHT UPPER QUADRANT COMPARISON:  Renal ultrasound 02/02/2020, MRI abdomen without and with contrast 12/19/2021 FINDINGS: Gallbladder: Surgically absent. No elicited positive right upper abdominal tenderness. Common bile duct: Diameter: Normal for age measuring 6 mm. Liver: No focal lesion identified. Within normal limits in parenchymal echogenicity with no focal abnormality noted on last month's MRI either. Portal vein is patent on color Doppler imaging with normal direction of blood flow towards the liver. Other: 3.7 x 3.6 x 2.7 cm heterogeneous solid mass of the superior pole right kidney is again noted, on MRI with no appreciable enhancement and on ultrasound with no registerable color flow. Ultrasound measurements in 2021 were 2.6 x 3.5 x 3.7 cm. IMPRESSION: 1. No focal liver abnormality. 2. No significant biliary prominence considering prior cholecystectomy and age. 3. Left renal mass. Similar measurements were obtained in 2021. No definite enhancement on MRI and no color flow registration on ultrasound. Agree with recommendation in the MRI report for follow-up  MRI without and with contrast in 6 months (from the 12/19/2021 MRI). Electronically Signed   By: T[place]ord Nab M.D.   On: 01/15/2022 07:18   CT HEAD WO CONTRAST (5MM)  Result Date: 01/15/2022 CLINICAL DATA:  Minor head trauma.  Weakness EXAM: CT HEAD WITHOUT CONTRAST TECHNIQUE: Contiguous axial images were obtained from the base of the skull through the vertex without intravenous contrast. RADIATION DOSE REDUCTION: This exam was performed according to the departmental dose-optimization program which includes automated exposure control, adjustment of the mA and/or kV according to patient size and/or use of iterative reconstruction technique. COMPARISON:  12/29/2021 FINDINGS: Brain: No evidence of acute infarction, hemorrhage, hydrocephalus, extra-axial collection or mass lesion/mass effect. Generalized cerebral volume loss. 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 Vascular: No hyperdense vessel or unexpected calcification. Skull: Normal. Negative for fracture or focal lesion. Sinuses/Orbits: No acute finding. IMPRESSION: No acute finding.  No evidence of intracranial injury. Electronically Signed   By: Jorje Guild M.D.   On: 01/15/2022 05:44   DG Chest Portable 1 View  Result Date: 01/15/2022 CLINICAL DATA:  Fall. Atrial fibrillation with rapid ventricular response EXAM: PORTABLE CHEST 1 VIEW COMPARISON:  02/20/2020 FINDINGS: Artifact from EKG leads. Normal heart size and mediastinal contours for portable technique. No acute infiltrate or edema. No effusion or pneumothorax. No acute osseous findings. Postoperative chest wall/axillae. IMPRESSION: No evidence of acute cardiopulmonary disease. Electronically Signed   By: Jorje Guild M.D.   On: 01/15/2022 05:30      Assessment/Plan Principal Problem:   Atrial fibrillation with RVR (HCC) Active Problems:   Myocardial injury   Chronic diastolic CHF (congestive heart failure) (HCC)   Sepsis (HCC)   UTI (urinary tract  infection)   Fall at home, initial encounter   Hypokalemia   Hypomagnesemia   Essential hypertension   TIA (transient ischemic attack)   Hypercholesteremia   CKD (chronic kidney disease) stage 2, GFR 60-89 ml/min   Right renal mass   Abnormal LFTs   Assessment and Plan: * Atrial fibrillation with RVR (HCC) HR is up to 135, improving,  HR is 70s now on Cardizem gtt.  -will place in PCU for obs -cardizem gtt -Continue metoprolol -Continue Coumadin per pharma  Myocardial injury Myocardial injury due to A-fib with RVR and sepsis. Troponin level 24 -->  63 -Trend troponin -Check FLP -  A1c was 5.2 on 12/28/2021, will not repeat today -Hold Lipitor due to abnormal liver function - will not start ASA since pt is on coumadin.   Chronic diastolic CHF (congestive heart failure) ([place]) 2D echo on 12/29/2021 showed EF of 50-55% with grade 2 diastolic dysfunction.  BNP is elevated at 659, but the patient has only trace leg edema, no oxygen desaturation or shortness of breath, chest x-ray is negative for pulmonary edema.  Does not seem to have CHF exacerbation.  -Will not start diuretics due to sepsis -Watch volume status closely  Sepsis ([place]) Sepsis due to UTI: Patient meets criteria for sepsis with tachycardia and RR 24.  Pending lactic acid level. -IV Rocephin -For urine culture -Trend lactic acid level -Check procalcitonin level -IV fluid: Will not give IV fluid unless lactic acid is significantly elevated due to elevated BNP and history of CHF  UTI (urinary tract infection) - See above  Fall at home, initial encounter - Fall precaution -PT/OT  Hypokalemia Potassium 2.9 -Repleted potassium  Hypomagnesemia Magnesium 1.4 -Repleted magnesium with magnesium sulfate, 2 g  Essential hypertension -IV hydralazine as needed -Metoprolol   TIA (transient ischemic attack) - Hold Lipitor due to abnormal liver function -Patient is on Coumadin for A-fib  Hypercholesteremia -  Hold Lipitor due to abnormal liver function  CKD (chronic kidney disease) stage 2, GFR 60-89 ml/min Slightly worsened than baseline.  Baseline creatinine 0.92 on 12/30/2021.  Her creatinine is 1.15, BUN 16.  Likely due to UTI -Avoid using renal toxic medications  Right renal mass This is a old issue. -Follow-up with PCP  Abnormal LFTs Etiology is not clear.  Right upper quadrant ultrasound finding is not impressive.  May be due to sepsis -Avoid using Tylenol -Check hepatitis panel -Hold Lipitor          DVT ppx: on Coumadin  Code Status: Full code  Family Communication: I have tried to call her husband, who did not pick up the phone.  I left a message to him  Disposition Plan:  Anticipate discharge back to previous environment  Consults called:  none  Admission status and Level of care: Progressive:   as inpt        Dispo: The patient is from: Home              Anticipated d/c is to: Home              Anticipated d/c date is: 1 day              Patient currently is not medically stable to d/c.    Severity of Illness:  The appropriate patient status for this patient is OBSERVATION. Observation status is judged to be reasonable and necessary in order to provide the required intensity of service to ensure the patient's safety. The patient's presenting symptoms, physical exam findings, and initial radiographic and laboratory data in the context of their medical condition is felt to place them at decreased risk for further clinical deterioration. Furthermore, it is anticipated that the patient will be medically stable for discharge from the hospital within 2 midnights of admission.     Date of Service 01/15/2022    [facility]   If 7PM-7AM, please contact night-coverage [email or web address] 01/15/2022, 3:31 PM

## 2022-01-15 NOTE — Assessment & Plan Note (Addendum)
HR is up to 135, improving,  HR is 70s now on Cardizem gtt.  -will place in PCU for obs -cardizem gtt -Continue metoprolol -Continue Coumadin per pharma

## 2022-01-15 NOTE — Assessment & Plan Note (Signed)
2D echo on 12/29/2021 showed EF of 50-55% with grade 2 diastolic dysfunction.  BNP is elevated at 659, but the patient has only trace leg edema, no oxygen desaturation or shortness of breath, chest x-ray is negative for pulmonary edema.  Does not seem to have CHF exacerbation.  -Will not start diuretics due to sepsis -Watch volume status closely

## 2022-01-15 NOTE — Assessment & Plan Note (Signed)
Etiology is not clear.  Right upper quadrant ultrasound finding is not impressive.  May be due to sepsis -Avoid using Tylenol -Check hepatitis panel -Hold Lipitor

## 2022-01-15 NOTE — ED Notes (Signed)
Pt refused ivpb potassium due to pain

## 2022-01-15 NOTE — Care Management Obs Status (Signed)
[place] NOTIFICATION   Patient Details  Name: Nikala Walsworth MRN: [id number] Date of Birth: Sep 21, 1940   Medicare Observation Status Notification Given:  Yes    Shelbie Hutching, RN 01/15/2022, 4:15 PM

## 2022-01-15 NOTE — Assessment & Plan Note (Signed)
Slightly worsened than baseline.  Baseline creatinine 0.92 on 12/30/2021.  Her creatinine is 1.15, BUN 16.  Likely due to UTI -Avoid using renal toxic medications

## 2022-01-15 NOTE — Assessment & Plan Note (Signed)
Magnesium 1.4 -Repleted magnesium with magnesium sulfate, 2 g

## 2022-01-16 DIAGNOSIS — N182 Chronic kidney disease, stage 2 (mild): Secondary | ICD-10-CM | POA: Diagnosis not present

## 2022-01-16 DIAGNOSIS — N390 Urinary tract infection, site not specified: Secondary | ICD-10-CM

## 2022-01-16 DIAGNOSIS — I4891 Unspecified atrial fibrillation: Secondary | ICD-10-CM | POA: Diagnosis not present

## 2022-01-16 LAB — PROTIME-INR
INR: 2.1 — ABNORMAL HIGH (ref 0.8–1.2)
Prothrombin Time: 22.9 seconds — ABNORMAL HIGH (ref 11.4–15.2)

## 2022-01-16 LAB — BASIC METABOLIC PANEL
Anion gap: 7 (ref 5–15)
BUN: 14 mg/dL (ref 8–23)
CO2: 22 mmol/L (ref 22–32)
Calcium: 8.6 mg/dL — ABNORMAL LOW (ref 8.9–10.3)
Chloride: 107 mmol/L (ref 98–111)
Creatinine, Ser: 1.02 mg/dL — ABNORMAL HIGH (ref 0.44–1.00)
GFR, Estimated: 55 mL/min — ABNORMAL LOW (ref >60)
Glucose, Bld: 89 mg/dL (ref 70–99)
Potassium: 4.7 mmol/L (ref 3.5–5.1)
Sodium: 136 mmol/L (ref 135–145)

## 2022-01-16 LAB — LIPID PANEL
Cholesterol: 112 mg/dL (ref 0–200)
HDL: 35 mg/dL — ABNORMAL LOW (ref >40)
LDL Cholesterol: 65 mg/dL (ref 0–99)
Total CHOL/HDL Ratio: 3.2 RATIO
Triglycerides: 58 mg/dL (ref <150)
VLDL: 12 mg/dL (ref 0–40)

## 2022-01-16 MED ORDER — WARFARIN SODIUM 2.5 MG PO TABS
2.5000 mg | ORAL_TABLET | Freq: Every day | ORAL | Status: DC
  Administered 2022-01-16: 2.5 mg via ORAL
  Filled 2022-01-16 (×2): qty 1

## 2022-01-16 NOTE — Consult Note (Signed)
ANTICOAGULATION CONSULT NOTE - Initial Consult  Pharmacy Consult for Warfarin Indication: atrial fibrillation  Allergies  Allergen Reactions   Penicillins Rash    Patient Measurements: Height: '5\' 2"'$  (157.5 cm) Weight: 86.9 kg (191 lb 9.3 oz) IBW/kg (Calculated) : 50.1  Vital Signs: Temp: 98.3 F (36.8 C) (09/08 0505) Temp Source: Oral (09/08 0505) BP: 138/64 (09/08 0505) Pulse Rate: 67 (09/08 0505)  Labs: Recent Labs    01/15/22 0508 01/15/22 5465 01/15/22 1307 01/15/22 1912 01/16/22 0432 01/16/22 0606  HGB 10.6*  --   --   --   --   --   HCT 32.5*  --   --   --   --   --   PLT 151  --   --   --   --   --   LABPROT 23.7*  --   --   --  22.9*  --   INR 2.1*  --   --   --  2.1*  --   CREATININE 1.15*  --   --   --   --  1.02*  TROPONINIHS 24* 63* 78* 60*  --   --      Estimated Creatinine Clearance: 44.3 mL/min (A) (by C-G formula based on SCr of 1.02 mg/dL (H)).   Medical History: Past Medical History:  Diagnosis Date   A-fib [facility])    Arthritis    Breast cancer ([place]) 11/26/2014   Cancer ([place])    breast   Intermittent palpitations    in early am    Medications:  Warfarin 2.5 mg daily   Assessment: 81 y.o. female who presents to the ED for evaluation of Tachycardia and Fall. Pharmacy consulted to manage home warfarin while inpatient for afib. CBC stable. New DDI: Ceftriaxone.   Noted elevated LFTs  Date INR Warfarin Dose  9/7 2.1 2.5 mg   9/8 2.1            Goal of Therapy:  INR 2-3 Monitor platelets by anticoagulation protocol: Yes   Plan:  INR remains therapeutic and unchanged since yesterday.  Will continue home warfarin dose at 2.5 mg daily.  Daily INR. CBC at least every 3 days.   Laderius Valbuena Rodriguez-Guzman PharmD, BCPS 01/16/2022 8:05 AM

## 2022-01-16 NOTE — Evaluation (Signed)
Occupational Therapy Evaluation Patient Details Name: Kristin Mitchell MRN: [id number] DOB: 22-Jan-1941 Today's Date: 01/16/2022   History of Present Illness Kristin Mitchell is a 81 y.o. female who presents to the ED for evaluation of Tachycardia and Fall. I reviewed medical DC summary from 8/22 where patient was evaluated for possible TIA with slurred speech. She has a history of paroxysmal A-fib on Coumadin, CKD. Patient presents to the ED for evaluation of intermittent episodes of dizziness and falling over the past couple nights. She reports she has fallen each night in the past 2 nights without head trauma or syncope. Reports that she "just falls."  She reports feeling episodes of dizziness without syncope.  Reports that she has been "laid up in her recliner" for the past couple days.  Denies any chest pain, fever, abdominal pain, diarrhea. Reports 1 episode of emesis this evening.   Clinical Impression   Pt was seen for OT evaluation this date. Prior to hospital admission, pt was generally independent with ADL and using a rollator for mobility and for PRN seated rest breaks during IADL task such as laundry and meal prep to support her energy. Pt lives with her spouse. Pt presents to acute OT demonstrating impaired ADL performance and functional mobility 2/2 decreased strength, activity tolerance, and balance (See OT problem list). Pt currently requires CGA for ADL transfers with RW and PRN MIN A for LB ADL tasks. Pt educated in role of OT, benefits of HHOT, and energy conservation strategies to maximize safety/indep with ADL/IADL in addition to what she already implements into daily routines. Pt verbalized understanding. Pt would benefit from skilled OT services to address noted impairments and functional limitations (see below for any additional details) in order to maximize safety and independence while minimizing falls risk and caregiver burden. Upon hospital discharge, recommend HHOT to  maximize pt safety and return to functional independence during meaningful occupations of daily life.      Recommendations for follow up therapy are one component of a multi-disciplinary discharge planning process, led by the attending physician.  Recommendations may be updated based on patient status, additional functional criteria and insurance authorization.   Follow Up Recommendations  Home health OT    Assistance Recommended at Discharge PRN  Patient can return home with the following A little help with walking and/or transfers;A little help with bathing/dressing/bathroom;Assistance with cooking/housework    Functional Status Assessment  Patient has had a recent decline in their functional status and demonstrates the ability to make significant improvements in function in a reasonable and predictable amount of time.  Equipment Recommendations  None recommended by OT    Recommendations for Other Services       Precautions / Restrictions Precautions Precautions: Fall Restrictions Weight Bearing Restrictions: No      Mobility Bed Mobility Overal bed mobility: Modified Independent                  Transfers Overall transfer level: Needs assistance Equipment used: Rolling walker (2 wheels) Transfers: Sit to/from Stand Sit to Stand: Min guard                  Balance Overall balance assessment: Needs assistance Sitting-balance support: No upper extremity supported, Feet supported Sitting balance-Leahy Scale: Good     Standing balance support: Single extremity supported Standing balance-Leahy Scale: Fair  ADL either performed or assessed with clinical judgement   ADL                                         General ADL Comments: Pt currently requires PRN assist for LB ADL Tasks, CGA for ADL transfers with RW     Vision         Perception     Praxis      Pertinent Vitals/Pain Pain  Assessment Pain Assessment: No/denies pain     Hand Dominance Right   Extremity/Trunk Assessment Upper Extremity Assessment Upper Extremity Assessment: RUE deficits/detail RUE Deficits / Details: chronic RTC injury   Lower Extremity Assessment Lower Extremity Assessment: Generalized weakness   Cervical / Trunk Assessment Cervical / Trunk Assessment: Normal   Communication Communication Communication: No difficulties   Cognition Arousal/Alertness: Awake/alert Behavior During Therapy: WFL for tasks assessed/performed Overall Cognitive Status: Within Functional Limits for tasks assessed                                       General Comments  HR: upper 70's at rest up to 90 BPM with ambulation    Exercises Other Exercises Other Exercises: Pt educated in role of OT, ECS for ADL and IADL, and falls prevention   Shoulder Instructions      Home Living Family/patient expects to be discharged to:: Private residence Living Arrangements: Spouse/significant other Available Help at Discharge: Family;Available 24 hours/day Type of Home: House Home Access: Level entry     Home Layout: One level     Bathroom Shower/Tub: Occupational psychologist: Handicapped height Bathroom Accessibility: Yes   Home Equipment: Rollator (4 wheels);Rolling Walker (2 wheels);Shower seat - built in;Grab bars - tub/shower      Lives With: Spouse    Prior Functioning/Environment Prior Level of Function : Independent/Modified Independent             Mobility Comments: Husband dealing with vertigo but typically provides transportation. Pt reports using rollator ADLs Comments: Pt generally independent. Does take sponge baths typically and completes IADL tasks while incorporating seated rest breaks with rollator.        OT Problem List: Decreased strength;Decreased activity tolerance;Impaired balance (sitting and/or standing);Decreased knowledge of use of DME or AE       OT Treatment/Interventions: Self-care/ADL training;Therapeutic exercise;Therapeutic activities;Energy conservation;DME and/or AE instruction;Patient/family education;Balance training    OT Goals(Current goals can be found in the care plan section) Acute Rehab OT Goals Patient Stated Goal: go home OT Goal Formulation: With patient Time For Goal Achievement: 01/30/22 Potential to Achieve Goals: Good ADL Goals Pt Will Perform Lower Body Dressing: with modified independence Pt Will Transfer to Toilet: with modified independence Pt Will Perform Toileting - Clothing Manipulation and hygiene: Independently Additional ADL Goal #1: Pt will verbalize plan to implement at least 1 learned ECS into daily ADL/IADL routine to maximize safety/indep and minimize falls risk.  OT Frequency: Min 2X/week    Co-evaluation              AM-PAC OT "6 Clicks" Daily Activity     Outcome Measure Help from another person eating meals?: None Help from another person taking care of personal grooming?: None Help from another person toileting, which includes using toliet, bedpan, or urinal?: A Little Help  from another person bathing (including washing, rinsing, drying)?: A Little Help from another person to put on and taking off regular upper body clothing?: None Help from another person to put on and taking off regular lower body clothing?: A Little 6 Click Score: 21   End of Session    Activity Tolerance: Patient tolerated treatment well Patient left: in bed;with call bell/phone within reach;with bed alarm set  OT Visit Diagnosis: Other abnormalities of gait and mobility (R26.89)                Time: 5615-3794 OT Time Calculation (min): 18 min Charges:  OT General Charges $OT Visit: 1 Visit OT Evaluation $OT Eval Low Complexity: 1 Low OT Treatments $Self Care/Home Management : 8-22 mins  Ardeth Perfect., MPH, MS, OTR/L ascom [phone] 01/16/22, 5:05 PM

## 2022-01-16 NOTE — Progress Notes (Signed)
PROGRESS NOTE    Kristin Mitchell  WUJ:[id number] DOB: 1941/01/11 DOA: 01/15/2022 PCP: Latanya Maudlin, NP    Assessment & Plan:   Principal Problem:   Atrial fibrillation with RVR ([place]) Active Problems:   Myocardial injury   Chronic diastolic CHF (congestive heart failure) (HCC)   Sepsis ([place])   UTI (urinary tract infection)   Fall at home, initial encounter   Hypokalemia   Hypomagnesemia   Essential hypertension   TIA (transient ischemic attack)   Hypercholesteremia   CKD (chronic kidney disease) stage 2, GFR 60-89 ml/min   Right renal mass   Abnormal LFTs  Assessment and Plan: Likely PAF: w/ RVR. D/c cardizem drip. Continue po metoprolol & warfarin. Pharmacy to dose/monitor INR/warfarin   Myocardial injury: w/ elevated troponins likely secondary to demand ischemia   Chronic diastolic CHF: echo on 9/56/2130 showed EF of 50-55% with grade 2 diastolic dysfunction.  CHF appears compensated. Monitor I/Os   Sepsis: met criteria w/ tachycardia, tachypnea, & UTI. Urine cx is pending. Continue on IV abxs Sepsis due to UTI: Patient meets criteria for sepsis with tachycardia and RR 24.     UTI: urine cx is pending. Continue on IV rocephin    Fall: at home. PT/OT consulted   Hypokalemia: WNL today    Hypomagnesemia: will continue to monitor    HTN: continue on metoprolol. IV hydralazine prn   TIA: holding statin secondary to transaminitis   HLD: holding statin secondary to transaminitis   CKDII: baseline Cr 0.92. 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 No need for a transfusion currently    Right renal mass: chronic. F/u w/ PCP    Transaminitis: etiology unclear. RUQ [place] is unremarkable.    Obesity: BMI 35.0. Complicates overall care & prognosis      DVT prophylaxis: warfarin  Code Status: full  Family Communication: called pt's husband, Tommy, but no answer Disposition Plan: depends on PT/OT recs  Level of care: Progressive  Status is:  Observation The patient remains OBS appropriate and will d/c before 2 midnights.   Consultants:    Procedures:  Antimicrobials:   Subjective: Pt c/o malaise  Objective: Vitals:   01/15/22 1834 01/15/22 2212 01/16/22 0030 01/16/22 0505  BP: 135/74 130/69 126/74 138/64  Pulse: 77 76 70 67  Resp:  '18 16 18  ' Temp: (!) 97.5 F (36.4 C) 98.2 F (36.8 C) 98.7 F (37.1 C) 98.3 F (36.8 C)  TempSrc: Oral  Oral Oral  SpO2: 100% 98% 98% 97%  Weight: 85.4 kg   86.9 kg  Height: '5\' 2"'  (1.575 m)       Intake/Output Summary (Last 24 hours) at 01/16/2022 0808 Last data filed at 01/16/2022 0650 Gross per 24 hour  Intake 535.46 ml  Output 1200 ml  Net -664.54 ml   Filed Weights   01/15/22 1314 01/15/22 1834 01/16/22 0505  Weight: 85 kg 85.4 kg 86.9 kg    Examination:  General exam: Appears calm and comfortable  Respiratory system: Clear to auscultation. Respiratory effort normal. Cardiovascular system: irregularly irregular. No  rubs, gallops or clicks.  Gastrointestinal system: Abdomen is obese, soft and nontender.Normal bowel sounds heard. Central nervous system: Alert and oriented. Moves all extremities  Psychiatry: Judgement and insight appear normal. Mood & affect appropriate.     Data Reviewed: I have personally reviewed following labs and imaging studies  CBC: Recent Labs  Lab 01/15/22 0508  WBC 7.5  NEUTROABS 5.4  HGB 10.6*  HCT  32.5*  MCV 91.5  PLT 563   Basic Metabolic Panel: Recent Labs  Lab 01/15/22 0508 01/16/22 0606  NA 133* 136  K 2.9* 4.7  CL 100 107  CO2 22 22  GLUCOSE 103* 89  BUN 16 14  CREATININE 1.15* 1.02*  CALCIUM 8.8* 8.6*  MG 1.4*  --    GFR: Estimated Creatinine Clearance: 44.3 mL/min (A) (by C-G formula based on SCr of 1.02 mg/dL (H)). Liver Function Tests: Recent Labs  Lab 01/15/22 0508  AST 77*  ALT 64*  ALKPHOS 280*  BILITOT 2.0*  PROT 6.3*  ALBUMIN 3.0*   Recent Labs  Lab 01/15/22 0508  LIPASE 29   No results  for input(s): "AMMONIA" in the last 168 hours. Coagulation Profile: Recent Labs  Lab 01/15/22 0508 01/16/22 0432  INR 2.1* 2.1*   Cardiac Enzymes: No results for input(s): "CKTOTAL", "CKMB", "CKMBINDEX", "TROPONINI" in the last 168 hours. BNP (last 3 results) No results for input(s): "PROBNP" in the last 8760 hours. HbA1C: No results for input(s): "HGBA1C" in the last 72 hours. CBG: No results for input(s): "GLUCAP" in the last 168 hours. Lipid Profile: Recent Labs    01/16/22 0606  CHOL 112  HDL 35*  LDLCALC 65  TRIG 58  CHOLHDL 3.2   Thyroid Function Tests: No results for input(s): "TSH", "T4TOTAL", "FREET4", "T3FREE", "THYROIDAB" in the last 72 hours. Anemia Panel: No results for input(s): "VITAMINB12", "FOLATE", "FERRITIN", "TIBC", "IRON", "RETICCTPCT" in the last 72 hours. Sepsis Labs: Recent Labs  Lab 01/15/22 0508 01/15/22 1307 01/15/22 1904  PROCALCITON 0.54  --   --   LATICACIDVEN  --  1.8 1.6    Recent Results (from the past 240 hour(s))  SARS Coronavirus 2 by RT PCR (hospital order, performed in [facility] hospital lab) *cepheid single result test* Anterior Nasal Swab     Status: None   Collection Time: 01/15/22  5:18 AM   Specimen: Anterior Nasal Swab  Result Value Ref Range Status   SARS Coronavirus 2 by RT PCR NEGATIVE NEGATIVE Final    Comment: (NOTE) SARS-CoV-2 target nucleic acids are NOT DETECTED.  The SARS-CoV-2 RNA is generally detectable in upper and lower respiratory specimens during the acute phase of infection. The lowest concentration of SARS-CoV-2 viral copies this assay can detect is 250 copies / mL. A negative result does not preclude SARS-CoV-2 infection and should not be used as the sole basis for treatment or other patient management decisions.  A negative result may occur with improper specimen collection / handling, submission of specimen other than nasopharyngeal swab, presence of viral mutation(s) within the areas targeted  by this assay, and inadequate number of viral copies (<250 copies / mL). A negative result must be combined with clinical observations, patient history, and epidemiological information.  Fact Sheet for Patients:   https://www.patel.info/  Fact Sheet for Healthcare Providers: https://hall.com/  This test is not yet approved or  cleared by the [place] FDA and has been authorized for detection and/or diagnosis of SARS-CoV-2 by FDA under an Emergency Use Authorization (EUA).  This EUA will remain in effect (meaning this test can be used) for the duration of the COVID-19 declaration under Section 564(b)(1) of the Act, 21 U.S.C. section 360bbb-3(b)(1), unless the authorization is terminated or revoked sooner.  Performed at [facility], [address] [place], [place] [address]          Radiology Studies: US ABDOMEN LIMITED RUQ (LIVER/GB)  Result Date: 01/15/2022 CLINICAL DATA:  Transaminitis with history of cholecystectomy and right renal mass. EXAM: ULTRASOUND ABDOMEN LIMITED RIGHT UPPER QUADRANT COMPARISON:  Renal ultrasound 02/02/2020, MRI abdomen without and with contrast 12/19/2021 FINDINGS: Gallbladder: Surgically absent. No elicited positive right upper abdominal tenderness. Common bile duct: Diameter: Normal for age measuring 6 mm. Liver: No focal lesion identified. Within normal limits in parenchymal echogenicity with no focal abnormality noted on last month's MRI either. Portal vein is patent on color Doppler imaging with normal direction of blood flow towards the liver. Other: 3.7 x 3.6 x 2.7 cm heterogeneous solid mass of the superior pole right kidney is again noted, on MRI with no appreciable enhancement and on ultrasound with no registerable color flow. Ultrasound measurements in 2021 were 2.6 x 3.5 x 3.7 cm. IMPRESSION: 1. No focal liver abnormality. 2. No significant biliary prominence considering prior  cholecystectomy and age. 3. Left renal mass. Similar measurements were obtained in 2021. No definite enhancement on MRI and no color flow registration on ultrasound. Agree with recommendation in the MRI report for follow-up MRI without and with contrast in 6 months (from the 12/19/2021 MRI). Electronically Signed   By: Telford Nab M.D.   On: 01/15/2022 07:18   CT HEAD WO CONTRAST (5MM)  Result Date: 01/15/2022 CLINICAL DATA:  Minor head trauma.  Weakness EXAM: CT HEAD WITHOUT CONTRAST TECHNIQUE: Contiguous axial images were obtained from the base of the skull through the vertex without intravenous contrast. RADIATION DOSE REDUCTION: This exam was performed according to the departmental dose-optimization program which includes automated exposure control, adjustment of the mA and/or kV according to patient size and/or use of iterative reconstruction technique. COMPARISON:  12/29/2021 FINDINGS: Brain: No evidence of acute infarction, hemorrhage, hydrocephalus, extra-axial collection or mass lesion/mass effect. Generalized cerebral volume loss. 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 Vascular: No hyperdense vessel or unexpected calcification. Skull: Normal. Negative for fracture or focal lesion. Sinuses/Orbits: No acute finding. IMPRESSION: No acute finding.  No evidence of intracranial injury. Electronically Signed   By: Jorje Guild M.D.   On: 01/15/2022 05:44   DG Chest Portable 1 View  Result Date: 01/15/2022 CLINICAL DATA:  Fall. Atrial fibrillation with rapid ventricular response EXAM: PORTABLE CHEST 1 VIEW COMPARISON:  02/20/2020 FINDINGS: Artifact from EKG leads. Normal heart size and mediastinal contours for portable technique. No acute infiltrate or edema. No effusion or pneumothorax. No acute osseous findings. Postoperative chest wall/axillae. IMPRESSION: No evidence of acute cardiopulmonary disease. Electronically Signed   By: Jorje Guild M.D.   On: 01/15/2022 05:30         Scheduled Meds:  calcium-vitamin D  1 tablet Oral Daily   cholecalciferol  2,000 Units Oral Daily   cyanocobalamin  1,000 mcg Oral Daily   metoprolol succinate  50 mg Oral Daily   sodium chloride  1 g Oral BID   warfarin  2.5 mg Oral q1600   Warfarin - Pharmacist Dosing Inpatient   Does not apply q1600   Continuous Infusions:  cefTRIAXone (ROCEPHIN)  IV 200 mL/hr at 01/16/22 0650   diltiazem (CARDIZEM) infusion Stopped (01/15/22 0606)     LOS: 1 day    Time spent: 35 mins    Wyvonnia Dusky, MD [facility] Pager [phone]  If 7PM-7AM, please contact night-coverage [email or web address] 01/16/2022, 8:08 AM

## 2022-01-16 NOTE — Evaluation (Signed)
Physical Therapy Evaluation Patient Details Name: Kristin Mitchell MRN: [id number] DOB: 11/15/1940 Today's Date: 01/16/2022  History of Present Illness  Kristin Mitchell is a 81 y.o. female who presents to the ED for evaluation of Tachycardia and Fall. I reviewed medical DC summary from 8/22 where patient was evaluated for possible TIA with slurred speech. She has a history of paroxysmal A-fib on Coumadin, CKD. Patient presents to the ED for evaluation of intermittent episodes of dizziness and falling over the past couple nights. She reports she has fallen each night in the past 2 nights without head trauma or syncope. Reports that she "just falls."  She reports feeling episodes of dizziness without syncope.  Reports that she has been "laid up in her recliner" for the past couple days.  Denies any chest pain, fever, abdominal pain, diarrhea. Reports 1 episode of emesis this evening.   Clinical Impression  Pt admitted with above diagnosis. Pt received upright in bed agreeable to PT services. Pt resting HR in upper 70s'-low 80's at rest. Reports at baseline she is mod-I with rollator and indep with ADL's/IADL's.  To date pt able to transfer from supine to sitting mod-I, stand to RW with minguard, and amb 26' with RW with supervision and slowed gait speed. Prior to return to recliner pt able to perform toileting independently. Pt returned to recliner with HR ranging from 85-90 BPM with activity. Pt with all needs in reach. Pt currently with functional limitations due to the deficits listed below (see PT Problem List). Pt will benefit from skilled PT to increase their independence and safety with mobility to allow discharge to the venue listed below.     Recommendations for follow up therapy are one component of a multi-disciplinary discharge planning process, led by the attending physician.  Recommendations may be updated based on patient status, additional functional criteria and insurance  authorization.  Follow Up Recommendations Home health PT      Assistance Recommended at Discharge PRN  Patient can return home with the following  Help with stairs or ramp for entrance;Assist for transportation    Equipment Recommendations None recommended by PT  Recommendations for Other Services       Functional Status Assessment Patient has had a recent decline in their functional status and demonstrates the ability to make significant improvements in function in a reasonable and predictable amount of time.     Precautions / Restrictions Precautions Precautions: Fall Restrictions Weight Bearing Restrictions: No      Mobility  Bed Mobility Overal bed mobility: Modified Independent               Patient Response: Cooperative  Transfers Overall transfer level: Needs assistance Equipment used: Rolling walker (2 wheels) Transfers: Sit to/from Stand Sit to Stand: Min guard           General transfer comment: Safe hand placement    Ambulation/Gait Ambulation/Gait assistance: Supervision Gait Distance (Feet): 36 Feet Assistive device: Rolling walker (2 wheels) Gait Pattern/deviations: Step-through pattern, Decreased step length - right, Decreased step length - left       General Gait Details: slowed and cautious gait. Maintains stability throughout bouts of gait with obviosu buckling or signs of weakness. Reliant on RW.  Stairs            Wheelchair Mobility    Modified Rankin (Stroke Patients Only)       Balance Overall balance assessment: Needs assistance Sitting-balance support: No upper extremity supported, Feet supported Sitting balance-Leahy Scale:  Good       Standing balance-Leahy Scale: Fair Standing balance comment: able to stand at sink and wash hands without extenral support                             Pertinent Vitals/Pain Pain Assessment Pain Assessment: No/denies pain    Home Living Family/patient expects  to be discharged to:: Private residence Living Arrangements: Spouse/significant other Available Help at Discharge: Family;Available 24 hours/day Type of Home: House Home Access: Level entry       Home Layout: One level Home Equipment: Rollator (4 wheels);Rolling Walker (2 wheels);Shower seat - built in;Grab bars - tub/shower      Prior Function Prior Level of Function : Independent/Modified Independent             Mobility Comments: Husband dealing with vertigo but typically provides transportation.       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    Lower Extremity Assessment Lower Extremity Assessment: Generalized weakness    Cervical / Trunk Assessment Cervical / Trunk Assessment: Normal  Communication   Communication: No difficulties  Cognition Arousal/Alertness: Awake/alert Behavior During Therapy: WFL for tasks assessed/performed Overall Cognitive Status: Within Functional Limits for tasks assessed                                          General Comments General comments (skin integrity, edema, etc.): HR: upper 70's at rest up to 90 BPM with ambulation    Exercises Other Exercises Other Exercises: Role of PT in acute setting, benefits of HH services   Assessment/Plan    PT Assessment Patient needs continued PT services  PT Problem List Decreased balance;Decreased mobility;Decreased activity tolerance       PT Treatment Interventions DME instruction;Balance training;Gait training;Neuromuscular re-education;Functional mobility training;Patient/family education;Therapeutic activities;Therapeutic exercise    PT Goals (Current goals can be found in the Care Plan section)  Acute Rehab PT Goals Patient Stated Goal: to improve strength and go home PT Goal Formulation: With patient Time For  Goal Achievement: 01/30/22 Potential to Achieve Goals: Good    Frequency Min 2X/week     Co-evaluation               AM-PAC PT "6 Clicks" Mobility  Outcome Measure Help needed turning from your back to your side while in a flat bed without using bedrails?: A Little Help needed moving from lying on your back to sitting on the side of a flat bed without using bedrails?: A Little Help needed moving to and from a bed to a chair (including a wheelchair)?: A Little Help needed standing up from a chair using your arms (e.g., wheelchair or bedside chair)?: A Little Help needed to walk in hospital room?: A Little Help needed climbing 3-5 steps with a railing? : A Lot 6 Click Score: 17    End of Session Equipment Utilized During Treatment: Gait belt Activity Tolerance: Patient tolerated treatment well Patient left: in chair;with chair alarm set;with call bell/phone within reach;with family/visitor present Nurse Communication: Mobility status PT Visit Diagnosis: Other abnormalities of gait and mobility (R26.89);Muscle weakness (generalized) (M62.81)    Time: 7741-2878 PT Time Calculation (min) (ACUTE ONLY): 34 min   Charges:  PT Evaluation $PT Eval Moderate Complexity: 1 Mod PT Treatments $Gait Training: 8-22 mins       Salem Caster. Fairly IV, PT, DPT Physical Therapist- [facility]  01/16/2022, 3:08 PM

## 2022-01-17 DIAGNOSIS — N390 Urinary tract infection, site not specified: Secondary | ICD-10-CM | POA: Diagnosis not present

## 2022-01-17 DIAGNOSIS — R7401 Elevation of levels of liver transaminase levels: Secondary | ICD-10-CM | POA: Diagnosis not present

## 2022-01-17 DIAGNOSIS — I4891 Unspecified atrial fibrillation: Secondary | ICD-10-CM | POA: Diagnosis not present

## 2022-01-17 LAB — BASIC METABOLIC PANEL
Anion gap: 7 (ref 5–15)
BUN: 19 mg/dL (ref 8–23)
CO2: 21 mmol/L — ABNORMAL LOW (ref 22–32)
Calcium: 8.8 mg/dL — ABNORMAL LOW (ref 8.9–10.3)
Chloride: 106 mmol/L (ref 98–111)
Creatinine, Ser: 0.87 mg/dL (ref 0.44–1.00)
GFR, Estimated: >60 mL/min (ref >60)
Glucose, Bld: 89 mg/dL (ref 70–99)
Potassium: 4.1 mmol/L (ref 3.5–5.1)
Sodium: 134 mmol/L — ABNORMAL LOW (ref 135–145)

## 2022-01-17 LAB — URINE CULTURE: Culture: >=100000 — AB

## 2022-01-17 LAB — CBC
HCT: 31.9 % — ABNORMAL LOW (ref 36.0–46.0)
Hemoglobin: 10.3 g/dL — ABNORMAL LOW (ref 12.0–15.0)
MCH: 29.5 pg (ref 26.0–34.0)
MCHC: 32.3 g/dL (ref 30.0–36.0)
MCV: 91.4 fL (ref 80.0–100.0)
Platelets: 164 10*3/uL (ref 150–400)
RBC: 3.49 MIL/uL — ABNORMAL LOW (ref 3.87–5.11)
RDW: 13.1 % (ref 11.5–15.5)
WBC: 6 10*3/uL (ref 4.0–10.5)
nRBC: 0 % (ref 0.0–0.2)

## 2022-01-17 LAB — PROTIME-INR
INR: 2.2 — ABNORMAL HIGH (ref 0.8–1.2)
Prothrombin Time: 24.2 seconds — ABNORMAL HIGH (ref 11.4–15.2)

## 2022-01-17 MED ORDER — CIPROFLOXACIN HCL 500 MG PO TABS: 500.0000 mg | ORAL_TABLET | Freq: Two times a day (BID) | ORAL | 0 refills | Status: AC

## 2022-01-17 NOTE — Consult Note (Signed)
ANTICOAGULATION CONSULT NOTE - Initial Consult  Pharmacy Consult for Warfarin Indication: atrial fibrillation  Allergies  Allergen Reactions   Penicillins Rash    Patient Measurements: Height: '5\' 2"'$  (157.5 cm) Weight: 85 kg (187 lb 6.3 oz) IBW/kg (Calculated) : 50.1  Vital Signs: Temp: 98.2 F (36.8 C) (09/09 0822) Temp Source: Oral (09/09 0450) BP: 160/80 (09/09 0822) Pulse Rate: 73 (09/09 0822)  Labs: Recent Labs    01/15/22 0508 01/15/22 3267 01/15/22 1307 01/15/22 1912 01/16/22 0432 01/16/22 0606 01/17/22 0557  HGB 10.6*  --   --   --   --   --  10.3*  HCT 32.5*  --   --   --   --   --  31.9*  PLT 151  --   --   --   --   --  164  LABPROT 23.7*  --   --   --  22.9*  --  24.2*  INR 2.1*  --   --   --  2.1*  --  2.2*  CREATININE 1.15*  --   --   --   --  1.02* 0.87  TROPONINIHS 24* 63* 78* 60*  --   --   --      Estimated Creatinine Clearance: 51.3 mL/min (by C-G formula based on SCr of 0.87 mg/dL).   Medical History: Past Medical History:  Diagnosis Date   A-fib [facility])    Arthritis    Breast cancer ([place]) 11/26/2014   Cancer ([place])    breast   Intermittent palpitations    in early am    Medications:  Warfarin 2.5 mg daily   Assessment: 81 y.o. female who presents to the ED for evaluation of Tachycardia and Fall. Pharmacy consulted to manage home warfarin while inpatient for afib. CBC stable. New DDI: Ceftriaxone.   Noted elevated LFTs  Date INR Warfarin Dose  9/7 2.1 2.5 mg   9/8 2.1 2.5 mg  9/9 2.2        Goal of Therapy:  INR 2-3 Monitor platelets by anticoagulation protocol: Yes   Plan:  INR remains therapeutic. Will continue home warfarin dose at 2.5 mg daily.  Daily INR. CBC at least every 3 days.   Pearla Dubonnet, PharmD Clinical Pharmacist 01/17/2022 11:04 AM

## 2022-01-17 NOTE — Discharge Summary (Signed)
Physician Discharge Summary  Kristin Mitchell LKG:[id number] DOB: 11-Oct-1940 DOA: 01/15/2022  PCP: Latanya Maudlin, NP  Admit date: 01/15/2022 Discharge date: 01/17/2022  Admitted From: home  Disposition:  home w/ home health   Recommendations for Outpatient Follow-up:  Follow up with PCP in 1-2 weeks   Home Health: yes Equipment/Devices:  Discharge Condition: stable  CODE STATUS: full  Diet recommendation: Heart Healthy  Brief/Interim Summary: HPI was taken from Dr. Blaine Hamper: Kristin Mitchell is a 81 y.o. female with medical history significant of hypertension, hyperlipidemia, TIA, breast cancer (s/p for bilateral mastectomy), atrial fibrillation on Coumadin, CKD-2, dCHF, who presents with weakness, fall, increased urinary frequency.   Patient states that he has generalized weakness in the past several days.  She also has dizziness and lightheadedness.  She fell 3 times in the past several days, no significant injury.  No loss of consciousness.  Denies any head or neck injury.  Patient does not have unilateral numbness, facial droop or slurred speech.  He states that he feels weak in both legs.  No chest pain, cough, shortness breath.  Patient nausea and vomited 1 time last night, which has resolved.  Currently no nausea vomiting, diarrhea or abdominal pain.  No fever or chills.  Patient reports increased urinary frequency recently, no dysuria or burning on urination.     Data reviewed independently and ED Course: pt was found to have WBC 7.5, positive urinalysis (hazy appearance, moderate amount of leukocyte, many bacteria, WBC 25-50), negative COVID PCR, BNP 659, INR 2.1, troponin level 24, slightly worsening renal function, potassium 2.9, magnesium 1.4, abnormal liver function (ALP 280, AST 77, ALT 64, total bilirubin 2.0), temperature normal, blood pressure 133/58, A fib with RVR with heart rate 135, RR 24, oxygen saturation 95% on room air.  Chest x-ray negative.  CT of head is  negative for acute intracranial abnormalities.  Patient is placed in progressive bed for obs.   EKG: I have personally reviewed.  Atrial fibrillation with RVR, heart rate of 139, QTc 489, LAD.   US-RUQ: 1. No focal liver abnormality. 2. No significant biliary prominence considering prior cholecystectomy and age. 3. Left renal mass. Similar measurements were obtained in 2021. No definite enhancement on MRI and no color flow registration on ultrasound. Agree with recommendation in the MRI report for follow-up MRI without and with contrast in 6 months (from the 12/19/2021 MRI).  As per Dr. Jimmye Norman 9/8-01/17/22: Pt presented w/ a. fib w/ RVR and IV diltiazem drip was ordered. Pt was also continue on po metoprolol and warfarin. RVR resolved prior to d/c. Of note, pt was found to have UTI secondary to e. coli. Pt initially received IV rocephin and was d/c home on po cipro to complete the course. PT/OT evaluated the pt and recommended home health. Home health was set up by CM prior to d/c.   Discharge Diagnoses:  Principal Problem:   Atrial fibrillation with RVR ([place]) Active Problems:   Myocardial injury   Chronic diastolic CHF (congestive heart failure) (HCC)   Sepsis (HCC)   UTI (urinary tract infection)   Fall at home, initial encounter   Hypokalemia   Hypomagnesemia   Essential hypertension   TIA (transient ischemic attack)   Hypercholesteremia   CKD (chronic kidney disease) stage 2, GFR 60-89 ml/min   Right renal mass   Abnormal LFTs Likely PAF: w/ RVR. D/c cardizem drip. Continue po metoprolol & warfarin. Pharmacy to dose/monitor INR/warfarin   Myocardial injury: w/ elevated troponins likely  secondary to demand ischemia   Chronic diastolic CHF: echo on 11/05/3149 showed EF of 50-55% with grade 2 diastolic dysfunction.  CHF appears compensated. Monitor I/Os   Sepsis: met criteria w/ tachycardia, tachypnea, & UTI. Urine cx is pending. Continue on abxs. Sepsis resolved      UTI:  urine cx is growing e.coli. Continue on IV rocephin while inpatient and d/c home on po cipro    Fall: at home. PT/OT recs HH    Hypokalemia: WNL today    Hypomagnesemia: will continue to monitor    HTN: continue on metoprolol. IV hydralazine prn   TIA: holding statin secondary to transaminitis while inpatient    HLD: holding statin secondary to transaminitis while inpatient    CKDII: baseline Cr 0.92. 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 No need for a transfusion currently   Left renal mass: chronic. F/u w/ PCP    Transaminitis: etiology unclear. RUQ [place] is unremarkable.    Obesity: BMI 34.2. Complicates overall care & prognosis    Discharge Instructions  Discharge Instructions     Diet - low sodium heart healthy   Complete by: As directed    Discharge instructions   Complete by: As directed    F/u w/ PCP in 1-2 weeks   Increase activity slowly   Complete by: As directed       Allergies as of 01/17/2022       Reactions   Penicillins Rash        Medication List     TAKE these medications    acetaminophen 500 MG tablet Commonly known as: TYLENOL Take 500 mg by mouth every 6 (six) hours as needed.   alendronate 70 MG tablet Commonly known as: FOSAMAX Take with a full glass of water on an empty stomach. What changed: additional instructions   atorvastatin 80 MG tablet Commonly known as: LIPITOR Take 1 tablet (80 mg total) by mouth daily.   Calcium 600+D 600-20 MG-MCG Tabs Generic drug: Calcium Carb-Cholecalciferol Take 1 tablet by mouth daily.   ciprofloxacin 500 MG tablet Commonly known as: Cipro Take 1 tablet (500 mg total) by mouth 2 (two) times daily for 4 days.   cyanocobalamin 1000 MCG tablet Commonly known as: VITAMIN B12 Take 1,000 mcg by mouth daily.   metoprolol succinate 50 MG 24 hr tablet Commonly known as: TOPROL-XL Take 50 mg by mouth daily.   sodium chloride 1 g tablet Take 1 g by mouth 2 (two) times daily.    Vitamin D3 50 MCG (2000 UT) Tabs Take 1 tablet by mouth daily.   warfarin 2.5 MG tablet Commonly known as: Coumadin Take 1 tablet (2.5 mg total) by mouth daily.        Allergies  Allergen Reactions   Penicillins Rash    Consultations:    Procedures/Studies: US ABDOMEN LIMITED RUQ (LIVER/GB)  Result Date: 01/15/2022 CLINICAL DATA:  Transaminitis with history of cholecystectomy and right renal mass. EXAM: ULTRASOUND ABDOMEN LIMITED RIGHT UPPER QUADRANT COMPARISON:  Renal ultrasound 02/02/2020, MRI abdomen without and with contrast 12/19/2021 FINDINGS: Gallbladder: Surgically absent. No elicited positive right upper abdominal tenderness. Common bile duct: Diameter: Normal for age measuring 6 mm. Liver: No focal lesion identified. Within normal limits in parenchymal echogenicity with no focal abnormality noted on last month's MRI either. Portal vein is patent on color Doppler imaging with normal direction of blood flow towards the liver. Other: 3.7 x 3.6 x 2.7 cm heterogeneous solid mass of the superior  pole right kidney is again noted, on MRI with no appreciable enhancement and on ultrasound with no registerable color flow. Ultrasound measurements in 2021 were 2.6 x 3.5 x 3.7 cm. IMPRESSION: 1. No focal liver abnormality. 2. No significant biliary prominence considering prior cholecystectomy and age. 3. Left renal mass. Similar measurements were obtained in 2021. No definite enhancement on MRI and no color flow registration on ultrasound. Agree with recommendation in the MRI report for follow-up MRI without and with contrast in 6 months (from the 12/19/2021 MRI). Electronically Signed   By: Telford Nab M.D.   On: 01/15/2022 07:18   CT HEAD WO CONTRAST (5MM)  Result Date: 01/15/2022 CLINICAL DATA:  Minor head trauma.  Weakness EXAM: CT HEAD WITHOUT CONTRAST TECHNIQUE: Contiguous axial images were obtained from the base of the skull through the vertex without intravenous contrast. RADIATION  DOSE REDUCTION: This exam was performed according to the departmental dose-optimization program which includes automated exposure control, adjustment of the mA and/or kV according to patient size and/or use of iterative reconstruction technique. COMPARISON:  12/29/2021 FINDINGS: Brain: No evidence of acute infarction, hemorrhage, hydrocephalus, extra-axial collection or mass lesion/mass effect. Generalized cerebral volume loss. 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 Vascular: No hyperdense vessel or unexpected calcification. Skull: Normal. Negative for fracture or focal lesion. Sinuses/Orbits: No acute finding. IMPRESSION: No acute finding.  No evidence of intracranial injury. Electronically Signed   By: Jorje Guild M.D.   On: 01/15/2022 05:44   DG Chest Portable 1 View  Result Date: 01/15/2022 CLINICAL DATA:  Fall. Atrial fibrillation with rapid ventricular response EXAM: PORTABLE CHEST 1 VIEW COMPARISON:  02/20/2020 FINDINGS: Artifact from EKG leads. Normal heart size and mediastinal contours for portable technique. No acute infiltrate or edema. No effusion or pneumothorax. No acute osseous findings. Postoperative chest wall/axillae. IMPRESSION: No evidence of acute cardiopulmonary disease. Electronically Signed   By: Jorje Guild M.D.   On: 01/15/2022 05:30   CT ANGIO HEAD NECK W WO CM  Result Date: 12/29/2021 CLINICAL DATA:  Transient ischemic attack (TIA) EXAM: CT ANGIOGRAPHY HEAD AND NECK TECHNIQUE: Multidetector CT imaging of the head and neck was performed using the standard protocol during bolus administration of intravenous contrast. Multiplanar CT image reconstructions and MIPs were obtained to evaluate the vascular anatomy. Carotid stenosis measurements (when applicable) are obtained utilizing NASCET criteria, using the distal internal carotid diameter as the denominator. RADIATION DOSE REDUCTION: This exam was performed according to the departmental  dose-optimization program which includes automated exposure control, adjustment of the mA and/or kV according to patient size and/or use of iterative reconstruction technique. CONTRAST:  54m OMNIPAQUE IOHEXOL 350 MG/ML SOLN COMPARISON:  Recent MR and CT imaging FINDINGS: CT HEAD Brain: There is no acute intracranial hemorrhage, mass effect, or edema. No new loss of gray-white differentiation. 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 Ventricles and sulci are stable in size and configuration. No extra-axial collection. Vascular: Better evaluated on CTA portion Skull: Calvarium is unremarkable. Sinuses/Orbits: No acute finding. Other: None. Review of the MIP images confirms the above findings CTA NECK Aortic arch: Minimal calcified plaque. Great vessel origins are patent. Right carotid system: Patent. Calcified plaque at the bifurcation without stenosis. Left carotid system: Patent. Calcified plaque at the bifurcation and proximal internal carotid with minimal stenosis. Vertebral arteries: Patent and codominant. Calcified plaque at the right vertebral origin with minimal stenosis. Skeleton: Facet predominant cervical spine degenerative changes. Degenerative changes of the temporomandibular joints. Other neck: Unremarkable.  Upper chest: No apical lung mass. Review of the MIP images confirms the above findings CTA HEAD Anterior circulation: Intracranial internal carotid arteries are patent with calcified plaque but no significant stenosis. Anterior and middle cerebral arteries are patent. Posterior circulation: Intracranial vertebral arteries are patent with calcified plaque on the right but no significant stenosis. Basilar artery is patent. Major cerebellar artery origins are patent. Posterior cerebral arteries are patent. Venous sinuses: Patent as allowed by contrast bolus timing. Review of the MIP images confirms the above findings IMPRESSION: No acute intracranial abnormality. No large vessel occlusion,  hemodynamically significant stenosis, or evidence of dissection. Electronically Signed   By: Macy Mis M.D.   On: 12/29/2021 18:20   ECHOCARDIOGRAM COMPLETE  Result Date: 12/29/2021    ECHOCARDIOGRAM REPORT   Patient Name:   Kristin Mitchell [facility] Date of Exam: 12/29/2021 Medical Rec #:  [id number]             Height:       62.0 in Accession #:    [id number]            Weight:       186.0 lb Date of Birth:  [birth year]              BSA:          1.854 m Patient Age:    63 years              BP:           177/68 mmHg Patient Gender: F                     HR:           70 bpm. Exam Location:  [facility] Procedure: 2D Echo, Cardiac Doppler and Color Doppler Indications:     TIA G45.9  History:         Patient has no prior history of Echocardiogram examinations.                  Arrythmias:Atrial Fibrillation. Breast cancer, palpitations.  Sonographer:     Sherrie Sport Referring Phys:  [id number] [place] Diagnosing Phys: Yolonda Kida MD  Sonographer Comments: Suboptimal parasternal window. IMPRESSIONS  1. Left ventricular ejection fraction, by estimation, is 50 to 55%. The left ventricle has normal function. The left ventricle has no regional wall motion abnormalities. There is moderate concentric left ventricular hypertrophy. Left ventricular diastolic parameters are consistent with Grade II diastolic dysfunction (pseudonormalization).  2. Right ventricular systolic function is normal. The right ventricular size is normal.  3. The mitral valve is normal in structure. Trivial mitral valve regurgitation.  4. The aortic valve is normal in structure. Aortic valve regurgitation is not visualized. Aortic valve sclerosis is present, with no evidence of aortic valve stenosis. FINDINGS  Left Ventricle: Left ventricular ejection fraction, by estimation, is 50 to 55%. The left ventricle has normal function. The left ventricle has no regional wall motion abnormalities. The left ventricular internal cavity size was normal  in size. There is  moderate concentric left ventricular hypertrophy. Left ventricular diastolic parameters are consistent with Grade II diastolic dysfunction (pseudonormalization). Right Ventricle: The right ventricular size is normal. No increase in right ventricular wall thickness. Right ventricular systolic function is normal. Left Atrium: Left atrial size was normal in size. Right Atrium: Right atrial size was normal in size. Pericardium: There is no evidence of pericardial effusion. Mitral Valve: The mitral valve is normal in structure.  There is mild thickening of the mitral valve leaflet(s). Mild mitral annular calcification. Trivial mitral valve regurgitation. Tricuspid Valve: The tricuspid valve is normal in structure. Tricuspid valve regurgitation is mild. Aortic Valve: The aortic valve is normal in structure. Aortic valve regurgitation is not visualized. Aortic valve sclerosis is present, with no evidence of aortic valve stenosis. Aortic valve mean gradient measures 2.0 mmHg. Aortic valve peak gradient measures 4.4 mmHg. Aortic valve area, by VTI measures 3.62 cm. Pulmonic Valve: The pulmonic valve was normal in structure. Pulmonic valve regurgitation is not visualized. Aorta: The ascending aorta was not well visualized. IAS/Shunts: No atrial level shunt detected by color flow Doppler.  LEFT VENTRICLE PLAX 2D LVIDd:         4.10 cm   Diastology LVIDs:         3.00 cm   LV e' medial:    3.37 cm/s LV PW:         1.70 cm   LV E/e' medial:  18.6 LV IVS:        1.20 cm   LV e' lateral:   7.51 cm/s LVOT diam:     2.00 cm   LV E/e' lateral: 8.3 LV SV:         61 LV SV Index:   33 LVOT Area:     3.14 cm  RIGHT VENTRICLE RV Basal diam:  3.30 cm RV S prime:     11.20 cm/s TAPSE (M-mode): 2.0 cm LEFT ATRIUM             Index        RIGHT ATRIUM           Index LA diam:        3.60 cm 1.94 cm/m   RA Area:     12.10 cm LA Vol (A2C):   65.1 ml 35.12 ml/m  RA Volume:   24.80 ml  13.38 ml/m LA Vol (A4C):   49.8 ml  26.87 ml/m LA Biplane Vol: 62.7 ml 33.83 ml/m  AORTIC VALVE AV Area (Vmax):    2.54 cm AV Area (Vmean):   2.66 cm AV Area (VTI):     3.62 cm AV Vmax:           105.00 cm/s AV Vmean:          68.500 cm/s AV VTI:            0.169 m AV Peak Grad:      4.4 mmHg AV Mean Grad:      2.0 mmHg LVOT Vmax:         84.90 cm/s LVOT Vmean:        58.000 cm/s LVOT VTI:          0.195 m LVOT/AV VTI ratio: 1.15  AORTA Ao Root diam: 3.10 cm MITRAL VALVE                TRICUSPID VALVE MV Area (PHT): 4.93 cm     TR Peak grad:   13.1 mmHg MV Decel Time: 154 msec     TR Vmax:        181.00 cm/s MV E velocity: 62.60 cm/s MV A velocity: 106.00 cm/s  SHUNTS MV E/A ratio:  0.59         Systemic VTI:  0.20 m                             Systemic Diam: 2.00 cm  Yolonda Kida MD Electronically signed by Yolonda Kida MD Signature Date/Time: 12/29/2021/4:24:38 PM    Final    US Carotid Bilateral  Result Date: 12/29/2021 CLINICAL DATA:  Hypertension Hyperlipidemia EXAM: BILATERAL CAROTID DUPLEX ULTRASOUND TECHNIQUE: Pearline Cables scale imaging, color Doppler and duplex ultrasound were performed of bilateral carotid and vertebral arteries in the neck. COMPARISON:  None available FINDINGS: Criteria: Quantification of carotid stenosis is based on velocity parameters that correlate the residual internal carotid diameter with NASCET-based stenosis levels, using the diameter of the distal internal carotid lumen as the denominator for stenosis measurement. The following velocity measurements were obtained: RIGHT ICA: 139/37 cm/sec CCA: 78/29 cm/sec SYSTOLIC ICA/CCA RATIO:  2.4 ECA: 61 cm/sec LEFT ICA: 94/25 cm/sec CCA: 56/21 cm/sec SYSTOLIC ICA/CCA RATIO:  1.1 ECA: 80 cm/sec RIGHT CAROTID ARTERY: Mildly elevated peak systolic velocity in the right internal carotid artery is likely artifact given only minimal heterogeneous plaque of the carotid bifurcation. RIGHT VERTEBRAL ARTERY:  Antegrade flow. LEFT CAROTID ARTERY: Minimal atheromatous plaque of  the carotid bifurcation. LEFT VERTEBRAL ARTERY:  Antegrade flow. IMPRESSION: No significant stenosis of internal carotid arteries. Electronically Signed   By: Miachel Roux M.D.   On: 12/29/2021 07:47   MR BRAIN WO CONTRAST  Result Date: 12/28/2021 CLINICAL DATA:  Transient ischemic attack, slurred and garbled speech and left-sided facial droop EXAM: MRI HEAD WITHOUT CONTRAST TECHNIQUE: Multiplanar, multiecho pulse sequences of the brain and surrounding structures were obtained without intravenous contrast. COMPARISON:  No prior MRI, correlation is made with CT head 12/28/2021 FINDINGS: Brain: No restricted diffusion to suggest acute or subacute infarct. No acute hemorrhage, mass, mass effect, or midline shift. No hemosiderin deposition to suggest remote hemorrhage. No hydrocephalus or extra-axial collection. 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 Scattered T2 hyperintense signal in the periventricular white matter, likely the sequela of mild chronic small vessel ischemic disease. Vascular: Normal arterial flow voids. Skull and upper cervical spine: Normal marrow signal. Sinuses/Orbits: Small mucous retention cyst in the right frontal sinus. Status post bilateral lens replacements. Other: The mastoids are well aerated. IMPRESSION: No acute intracranial process. No evidence of acute or subacute infarct. Electronically Signed   By: Merilyn Baba M.D.   On: 12/28/2021 23:36   CT HEAD WO CONTRAST  Result Date: 12/28/2021 CLINICAL DATA:  Pt had slurred and garbled speech and L sided facial droop that started at 1300 and only lasted a couple minutes- pt has normal speech now, no visual changes, no facial droop noted- stroke screen negative- pt does take warfarin with last dose last night EXAM: CT HEAD WITHOUT CONTRAST TECHNIQUE: Contiguous axial images were obtained from the base of the skull through the vertex without intravenous contrast. RADIATION DOSE REDUCTION: This exam was performed according to the  departmental dose-optimization program which includes automated exposure control, adjustment of the mA and/or kV according to patient size and/or use of iterative reconstruction technique. COMPARISON:  None Available. FINDINGS: Brain: No evidence of acute infarction, hemorrhage, hydrocephalus, extra-axial collection or mass lesion/mass effect. Area of hypoattenuation lies along the anterior/inferior and lateral aspect of the right thalamus, suspected to be due to chronic ischemic change. Vascular: No hyperdense vessel or unexpected calcification. Skull: Normal. Negative for fracture or focal lesion. Sinuses/Orbits: Globes and orbits are unremarkable. Visualized sinuses are clear. Other: None. IMPRESSION: 1. No acute intracranial abnormalities. Electronically Signed   By: Lajean Manes M.D.   On: 12/28/2021 17:55   MR Abdomen W Wo Contrast  Result Date: 12/20/2021 CLINICAL DATA:  Follow-up right renal mass. EXAM: MRI ABDOMEN WITHOUT AND WITH CONTRAST TECHNIQUE: Multiplanar multisequence MR imaging of the abdomen was performed both before and after the administration of intravenous contrast. CONTRAST:  7.36m GADAVIST GADOBUTROL 1 MMOL/ML IV SOLN COMPARISON:  12/05/2020 FINDINGS: Lower chest: No acute findings. Hepatobiliary: No hepatic masses identified. Prior cholecystectomy. No evidence of biliary obstruction. Pancreas:  No mass or inflammatory changes. Spleen:  Within normal limits in size and appearance. Adrenals/Urinary Tract: The adrenal glands and left kidney are normal in appearance. An exophytic subcapsular lesion is again seen arising from the lateral upper pole of the right kidney which has increased in size since previous study, currently measuring 3.9 x 3.1 cm, compared to 3.1 by 2.8 cm previously. This lesion continues to show heterogeneous T1 hyperintense signal consistent with hemorrhage, however when allowing for mild motion artifact on dynamic subtraction imaging, there is no convincing evidence  of internal contrast enhancement. No evidence of hydronephrosis. Stomach/Bowel: Unremarkable. Vascular/Lymphatic: No pathologically enlarged lymph nodes identified. No acute vascular findings. Other:  None. Musculoskeletal:  No suspicious bone lesions identified. IMPRESSION: 3.9 cm exophytic hemorrhagic lesion in the right kidney has increased in size, but shows no change in morphology, and no definite contrast enhancement. This most closely matches criteria for an indeterminate but probably benign Bosniak category 81F cystic lesion. Consider continued follow-up by abdomen MRI without and with contrast in 6 months. This recommendation follows ACR consensus guidelines: Management of the Incidental Renal Mass on CT: A White Paper of the ACR Incidental Findings Committee. J Am Coll Radiol [phone] No evidence of abdominal metastatic disease or other acute findings. Electronically Signed   By: JMarlaine HindM.D.   On: 12/20/2021 11:25   (Echo, Carotid, EGD, Colonoscopy, ERCP)    Subjective: Pt denies any complaints   Discharge Exam: Vitals:   01/17/22 0822 01/17/22 1127  BP: (!) 160/80   Pulse: 73 69  Resp: 18 18  Temp: 98.2 F (36.8 C) 98.1 F (36.7 C)  SpO2: 97% 97%   Vitals:   01/17/22 0450 01/17/22 0516 01/17/22 0822 01/17/22 1127  BP: (!) 158/78  (!) 160/80   Pulse: 71  73 69  Resp: '16  18 18  ' Temp: 98.2 F (36.8 C)  98.2 F (36.8 C) 98.1 F (36.7 C)  TempSrc: Oral   Oral  SpO2: 98%  97% 97%  Weight:  85 kg    Height:        General: Pt is alert, awake, not in acute distress Cardiovascular: RRR,A bilaterally, no wheezing, no rhonchi Abdominal: Soft, NT,obese, bowel sounds + Extremities: no edema, no cyanosis    The results of significant diagnostics from this hospitalization (including imaging, microbiology, ancillary and laboratory) are listed below for reference.     Microbiology: Recent Results (from the past 240 hour(s))  SARS Coronavirus 2 by RT PCR ([facility] *cepheid single result test* Anterior Nasal Swab     Status: None   Collection Time: 01/15/22  5:18 AM   Specimen: Anterior Nasal Swab  Result Value Ref Range Status   SARS Coronavirus 2 by RT PCR NEGATIVE NEGATIVE Final    Comment: (NOTE) SARS-CoV-2 target nucleic acids are NOT DETECTED.  The SARS-CoV-2 RNA is generally detectable in upper and lower respiratory specimens during the acute phase of infection. The lowest concentration of SARS-CoV-2 viral copies this assay can detect is 250 copies / mL. A negative result does not preclude SARS-CoV-2 infection and should not  be used as the sole basis for treatment or other patient management decisions.  A negative result may occur with improper specimen collection / handling, submission of specimen other than nasopharyngeal swab, presence of viral mutation(s) within the areas targeted by this assay, and inadequate number of viral copies (<250 copies / mL). A negative result must be combined with clinical observations, patient history, and epidemiological information.  Fact Sheet for Patients:   https://www.patel.info/  Fact Sheet for Healthcare Providers: https://hall.com/  This test is not yet approved or  cleared by the [place] FDA and has been authorized for detection and/or diagnosis of SARS-CoV-2 by FDA under an Emergency Use Authorization (EUA).  This EUA will remain in effect (meaning this test can be used) for the duration of the COVID-19 declaration under Section 564(b)(1) of the Act, 21 U.S.C. section 360bbb-3(b)(1), unless the authorization is terminated or revoked sooner.  Performed at [facility], [address] [place], [place] [address]   Urine Culture     Status: Abnormal   Collection Time: 01/15/22  5:18 AM   Specimen: Urine, Clean Catch  Result Value Ref Range Status   Specimen Description   Final    URINE,  CLEAN CATCH Performed at [facility], [address] [place], [place] [address]    Special Requests   Final    NONE Performed at [facility], [place],  [address]    Culture >=100,000 COLONIES/mL ESCHERICHIA COLI (A)  Final   Report Status 01/17/2022 FINAL  Final   Organism ID, Bacteria ESCHERICHIA COLI (A)  Final      Susceptibility   Escherichia coli - MIC*    AMPICILLIN <=2 SENSITIVE Sensitive     CEFAZOLIN <=4 SENSITIVE Sensitive     CEFEPIME <=0.12 SENSITIVE Sensitive     CEFTRIAXONE <=0.25 SENSITIVE Sensitive     CIPROFLOXACIN <=0.25 SENSITIVE Sensitive     GENTAMICIN <=1 SENSITIVE Sensitive     IMIPENEM <=0.25 SENSITIVE Sensitive     NITROFURANTOIN <=16 SENSITIVE Sensitive     TRIMETH/SULFA <=20 SENSITIVE Sensitive     AMPICILLIN/SULBACTAM <=2 SENSITIVE Sensitive     PIP/TAZO <=4 SENSITIVE Sensitive     * >=100,000 COLONIES/mL ESCHERICHIA COLI     Labs: BNP (last 3 results) Recent Labs    01/15/22 0508  BNP 111.5*   Basic Metabolic Panel: Recent Labs  Lab 01/15/22 0508 01/16/22 0606 01/17/22 0557  NA 133* 136 134*  K 2.9* 4.7 4.1  CL 100 107 106  CO2 22 22 21*  GLUCOSE 103* 89 89  BUN '16 14 19  ' CREATININE 1.15* 1.02* 0.87  CALCIUM 8.8* 8.6* 8.8*  MG 1.4*  --   --    Liver Function Tests: Recent Labs  Lab 01/15/22 0508  AST 77*  ALT 64*  ALKPHOS 280*  BILITOT 2.0*  PROT 6.3*  ALBUMIN 3.0*   Recent Labs  Lab 01/15/22 0508  LIPASE 29   No results for input(s): "AMMONIA" in the last 168 hours. CBC: Recent Labs  Lab 01/15/22 0508 01/17/22 0557  WBC 7.5 6.0  NEUTROABS 5.4  --   HGB 10.6* 10.3*  HCT 32.5* 31.9*  MCV 91.5 91.4  PLT 151 164   Cardiac Enzymes: No results for input(s): "CKTOTAL", "CKMB", "CKMBINDEX", "TROPONINI" in the last 168 hours. BNP: Invalid input(s): "POCBNP" CBG: No results for input(s): "GLUCAP" in the last 168 hours. D-Dimer No results for input(s): "DDIMER" in the  last 72 hours. Hgb A1c No  results for input(s): "HGBA1C" in the last 72 hours. Lipid Profile Recent Labs    01/16/22 0606  CHOL 112  HDL 35*  LDLCALC 65  TRIG 58  CHOLHDL 3.2   Thyroid function studies No results for input(s): "TSH", "T4TOTAL", "T3FREE", "THYROIDAB" in the last 72 hours.  Invalid input(s): "FREET3" Anemia work up No results for input(s): "VITAMINB12", "FOLATE", "FERRITIN", "TIBC", "IRON", "RETICCTPCT" in the last 72 hours. Urinalysis    Component Value Date/Time   COLORURINE YELLOW (A) 01/15/2022 0518   APPEARANCEUR HAZY (A) 01/15/2022 0518   LABSPEC 1.003 (L) 01/15/2022 0518   PHURINE 6.0 01/15/2022 0518   GLUCOSEU NEGATIVE 01/15/2022 0518   HGBUR SMALL (A) 01/15/2022 0518   BILIRUBINUR NEGATIVE 01/15/2022 0518   KETONESUR NEGATIVE 01/15/2022 0518   PROTEINUR NEGATIVE 01/15/2022 0518   NITRITE NEGATIVE 01/15/2022 0518   LEUKOCYTESUR MODERATE (A) 01/15/2022 0518   Sepsis Labs Recent Labs  Lab 01/15/22 0508 01/17/22 0557  WBC 7.5 6.0   Microbiology Recent Results (from the past 240 hour(s))  SARS Coronavirus 2 by RT PCR (hospital order, performed in [place] hospital lab) *cepheid single result test* Anterior Nasal Swab     Status: None   Collection Time: 01/15/22  5:18 AM   Specimen: Anterior Nasal Swab  Result Value Ref Range Status   SARS Coronavirus 2 by RT PCR NEGATIVE NEGATIVE Final    Comment: (NOTE) SARS-CoV-2 target nucleic acids are NOT DETECTED.  The SARS-CoV-2 RNA is generally detectable in upper and lower respiratory specimens during the acute phase of infection. The lowest concentration of SARS-CoV-2 viral copies this assay can detect is 250 copies / mL. A negative result does not preclude SARS-CoV-2 infection and should not be used as the sole basis for treatment or other patient management decisions.  A negative result may occur with improper specimen collection / handling, submission of specimen other than nasopharyngeal  swab, presence of viral mutation(s) within the areas targeted by this assay, and inadequate number of viral copies (<250 copies / mL). A negative result must be combined with clinical observations, patient history, and epidemiological information.  Fact Sheet for Patients:   https://www.patel.info/  Fact Sheet for Healthcare Providers: https://hall.com/  This test is not yet approved or  cleared by the [place] FDA and has been authorized for detection and/or diagnosis of SARS-CoV-2 by FDA under an Emergency Use Authorization (EUA).  This EUA will remain in effect (meaning this test can be used) for the duration of the COVID-19 declaration under Section 564(b)(1) of the Act, 21 U.S.C. section 360bbb-3(b)(1), unless the authorization is terminated or revoked sooner.  Performed at [facility], [address] [place], [place] [address]   Urine Culture     Status: Abnormal   Collection Time: 01/15/22  5:18 AM   Specimen: Urine, Clean Catch  Result Value Ref Range Status   Specimen Description   Final    URINE, CLEAN CATCH Performed at [facility], [address], [place], [place] [address]    Special Requests   Final    NONE Performed at [facility], [place], [place] [address]    Culture >=100,000 COLONIES/mL ESCHERICHIA COLI (A)  Final   Report Status 01/17/2022 FINAL  Final   Organism ID, Bacteria ESCHERICHIA COLI (A)  Final      Susceptibility   Escherichia coli - MIC*    AMPICILLIN <=2 SENSITIVE Sensitive     CEFAZOLIN <=4 SENSITIVE Sensitive  CEFEPIME <=0.12 SENSITIVE Sensitive     CEFTRIAXONE <=0.25 SENSITIVE Sensitive     CIPROFLOXACIN <=0.25 SENSITIVE Sensitive     GENTAMICIN <=1 SENSITIVE Sensitive     IMIPENEM <=0.25 SENSITIVE Sensitive     NITROFURANTOIN <=16 SENSITIVE Sensitive     TRIMETH/SULFA <=20 SENSITIVE Sensitive     AMPICILLIN/SULBACTAM <=2 SENSITIVE  Sensitive     PIP/TAZO <=4 SENSITIVE Sensitive     * >=100,000 COLONIES/mL ESCHERICHIA COLI     Time coordinating discharge: Over 30 minutes  SIGNED:   Wyvonnia Dusky, MD  [facility] 01/17/2022, 11:29 AM Pager   If 7PM-7AM, please contact night-coverage [email or web address]

## 2022-01-17 NOTE — TOC Transition Note (Signed)
Transition of Care [facility]) - CM/SW Discharge Note   Patient Details  Name: Kristin Mitchell MRN: [id number] Date of Birth: March 29, 1941  Transition of Care [facility]) CM/SW Contact:  Izola Price, RN Phone Number: 01/17/2022, 11:59 AM   Clinical Narrative:   01/17/22: Damaris Schooner with patient regarding discharge plan of [facility] PT/OT and patient was willing to have services post discharge. No preference of agency and verbal permission to seek agency. Patient has a 4 wheel wallker/rollator at home. She does not have any stairs to deal with. She was driving prior to this admission but does have transportation to appointment, store, etc until she is cleared to drive again. PCP is Dr. Thornton Dales. RX is [facility] [place]. [facility] per Malachy Mood R will pick up for [facility]. Attempted to notify patient of [facility] agency but was unable to reach by phone. Spoke with spouse and gave him the information and that start of service would be Sunday or Monday after discharge. No DME ordered or recommended. No stairs so no ramp needed per PT notes. Simmie Davies RN CM     Final next level of care: Home w Home Health Services Barriers to Discharge: Barriers Resolved   Patient Goals and CMS Choice     Choice offered to / list presented to : Patient  Discharge Placement                  Name of family member notified: Spouse notified of discharge. Patient and family notified of of transfer: 01/17/22  Discharge Plan and Services                DME Arranged: N/A DME Agency: NA       HH Arranged: PT, OT HH Agency: [place] Date [place]: 01/17/22 Time [place]: 1159 Representative spoke with at [place]: [place] ([place]) Interventions     Readmission Risk Interventions     No data to display

## 2022-02-06 ENCOUNTER — Other Ambulatory Visit: Payer: Self-pay | Admitting: Gerontology

## 2022-02-06 DIAGNOSIS — R609 Edema, unspecified: Secondary | ICD-10-CM

## 2022-02-06 DIAGNOSIS — R748 Abnormal levels of other serum enzymes: Secondary | ICD-10-CM

## 2022-02-06 DIAGNOSIS — E871 Hypo-osmolality and hyponatremia: Secondary | ICD-10-CM

## 2022-02-06 DIAGNOSIS — R7989 Other specified abnormal findings of blood chemistry: Secondary | ICD-10-CM

## 2022-02-12 ENCOUNTER — Ambulatory Visit
Admission: RE | Admit: 2022-02-12 | Discharge: 2022-02-12 | Disposition: A | Discharge status: Home / Self Care | Payer: Medicare Other | Source: Ambulatory Visit | Attending: Gerontology | Admitting: Gerontology

## 2022-02-12 DIAGNOSIS — R609 Edema, unspecified: Secondary | ICD-10-CM

## 2022-02-12 DIAGNOSIS — R748 Abnormal levels of other serum enzymes: Secondary | ICD-10-CM

## 2022-02-12 DIAGNOSIS — E871 Hypo-osmolality and hyponatremia: Secondary | ICD-10-CM

## 2022-02-12 DIAGNOSIS — R7989 Other specified abnormal findings of blood chemistry: Secondary | ICD-10-CM

## 2022-02-20 ENCOUNTER — Other Ambulatory Visit: Payer: Self-pay

## 2022-02-20 ENCOUNTER — Inpatient Hospital Stay
Admission: EM | Admit: 2022-02-20 | Discharge: 2022-02-26 | DRG: 308 | Disposition: A | Discharge status: Home Health | Payer: Medicare Other | Attending: Internal Medicine | Admitting: Internal Medicine

## 2022-02-20 ENCOUNTER — Emergency Department: Payer: Medicare Other

## 2022-02-20 DIAGNOSIS — I4891 Unspecified atrial fibrillation: Principal | ICD-10-CM | POA: Diagnosis present

## 2022-02-20 DIAGNOSIS — I10 Essential (primary) hypertension: Secondary | ICD-10-CM | POA: Diagnosis present

## 2022-02-20 DIAGNOSIS — Z880 Allergy status to penicillin: Secondary | ICD-10-CM | POA: Diagnosis not present

## 2022-02-20 DIAGNOSIS — I130 Hypertensive heart and chronic kidney disease with heart failure and stage 1 through stage 4 chronic kidney disease, or unspecified chronic kidney disease: Secondary | ICD-10-CM | POA: Diagnosis present

## 2022-02-20 DIAGNOSIS — J9601 Acute respiratory failure with hypoxia: Secondary | ICD-10-CM | POA: Diagnosis present

## 2022-02-20 DIAGNOSIS — I959 Hypotension, unspecified: Secondary | ICD-10-CM | POA: Diagnosis not present

## 2022-02-20 DIAGNOSIS — Z6834 Body mass index (BMI) 34.0-34.9, adult: Secondary | ICD-10-CM

## 2022-02-20 DIAGNOSIS — I5043 Acute on chronic combined systolic (congestive) and diastolic (congestive) heart failure: Secondary | ICD-10-CM | POA: Diagnosis present

## 2022-02-20 DIAGNOSIS — Z961 Presence of intraocular lens: Secondary | ICD-10-CM | POA: Diagnosis present

## 2022-02-20 DIAGNOSIS — E785 Hyperlipidemia, unspecified: Secondary | ICD-10-CM | POA: Diagnosis present

## 2022-02-20 DIAGNOSIS — Z820 Family history of epilepsy and other diseases of the nervous system: Secondary | ICD-10-CM

## 2022-02-20 DIAGNOSIS — I509 Heart failure, unspecified: Secondary | ICD-10-CM

## 2022-02-20 DIAGNOSIS — Z853 Personal history of malignant neoplasm of breast: Secondary | ICD-10-CM

## 2022-02-20 DIAGNOSIS — I340 Nonrheumatic mitral (valve) insufficiency: Secondary | ICD-10-CM | POA: Diagnosis present

## 2022-02-20 DIAGNOSIS — E871 Hypo-osmolality and hyponatremia: Secondary | ICD-10-CM | POA: Diagnosis present

## 2022-02-20 DIAGNOSIS — Z9013 Acquired absence of bilateral breasts and nipples: Secondary | ICD-10-CM

## 2022-02-20 DIAGNOSIS — Z8673 Personal history of transient ischemic attack (TIA), and cerebral infarction without residual deficits: Secondary | ICD-10-CM

## 2022-02-20 DIAGNOSIS — E876 Hypokalemia: Secondary | ICD-10-CM | POA: Diagnosis present

## 2022-02-20 DIAGNOSIS — I739 Peripheral vascular disease, unspecified: Secondary | ICD-10-CM | POA: Diagnosis present

## 2022-02-20 DIAGNOSIS — Z807 Family history of other malignant neoplasms of lymphoid, hematopoietic and related tissues: Secondary | ICD-10-CM | POA: Diagnosis not present

## 2022-02-20 DIAGNOSIS — N182 Chronic kidney disease, stage 2 (mild): Secondary | ICD-10-CM | POA: Diagnosis present

## 2022-02-20 DIAGNOSIS — I5032 Chronic diastolic (congestive) heart failure: Secondary | ICD-10-CM | POA: Diagnosis present

## 2022-02-20 DIAGNOSIS — Z9842 Cataract extraction status, left eye: Secondary | ICD-10-CM | POA: Diagnosis not present

## 2022-02-20 DIAGNOSIS — I480 Paroxysmal atrial fibrillation: Principal | ICD-10-CM | POA: Diagnosis present

## 2022-02-20 DIAGNOSIS — Z66 Do not resuscitate: Secondary | ICD-10-CM | POA: Diagnosis present

## 2022-02-20 DIAGNOSIS — Z79899 Other long term (current) drug therapy: Secondary | ICD-10-CM

## 2022-02-20 DIAGNOSIS — C50411 Malignant neoplasm of upper-outer quadrant of right female breast: Secondary | ICD-10-CM | POA: Diagnosis present

## 2022-02-20 DIAGNOSIS — E669 Obesity, unspecified: Secondary | ICD-10-CM | POA: Diagnosis present

## 2022-02-20 DIAGNOSIS — I43 Cardiomyopathy in diseases classified elsewhere: Secondary | ICD-10-CM | POA: Diagnosis present

## 2022-02-20 DIAGNOSIS — Z7901 Long term (current) use of anticoagulants: Secondary | ICD-10-CM | POA: Diagnosis not present

## 2022-02-20 LAB — CBC WITH DIFFERENTIAL/PLATELET
Abs Immature Granulocytes: 0.03 10*3/uL (ref 0.00–0.07)
Basophils Absolute: 0.1 10*3/uL (ref 0.0–0.1)
Basophils Relative: 1 %
Eosinophils Absolute: 0.2 10*3/uL (ref 0.0–0.5)
Eosinophils Relative: 3 %
HCT: 33.4 % — ABNORMAL LOW (ref 36.0–46.0)
Hemoglobin: 10.4 g/dL — ABNORMAL LOW (ref 12.0–15.0)
Immature Granulocytes: 1 %
Lymphocytes Relative: 14 %
Lymphs Abs: 0.8 10*3/uL (ref 0.7–4.0)
MCH: 29.9 pg (ref 26.0–34.0)
MCHC: 31.1 g/dL (ref 30.0–36.0)
MCV: 96 fL (ref 80.0–100.0)
Monocytes Absolute: 0.5 10*3/uL (ref 0.1–1.0)
Monocytes Relative: 9 %
Neutro Abs: 4 10*3/uL (ref 1.7–7.7)
Neutrophils Relative %: 72 %
Platelets: 151 10*3/uL (ref 150–400)
RBC: 3.48 MIL/uL — ABNORMAL LOW (ref 3.87–5.11)
RDW: 16 % — ABNORMAL HIGH (ref 11.5–15.5)
WBC: 5.4 10*3/uL (ref 4.0–10.5)
nRBC: 0 % (ref 0.0–0.2)

## 2022-02-20 LAB — TROPONIN I (HIGH SENSITIVITY)
Troponin I (High Sensitivity): 9 ng/L (ref <18)
Troponin I (High Sensitivity): 12 ng/L (ref <18)

## 2022-02-20 LAB — COMPREHENSIVE METABOLIC PANEL
ALT: 44 U/L (ref 0–44)
AST: 67 U/L — ABNORMAL HIGH (ref 15–41)
Albumin: 3 g/dL — ABNORMAL LOW (ref 3.5–5.0)
Alkaline Phosphatase: 107 U/L (ref 38–126)
Anion gap: 8 (ref 5–15)
BUN: 16 mg/dL (ref 8–23)
CO2: 22 mmol/L (ref 22–32)
Calcium: 8.6 mg/dL — ABNORMAL LOW (ref 8.9–10.3)
Chloride: 106 mmol/L (ref 98–111)
Creatinine, Ser: 0.92 mg/dL (ref 0.44–1.00)
GFR, Estimated: >60 mL/min (ref >60)
Glucose, Bld: 117 mg/dL — ABNORMAL HIGH (ref 70–99)
Potassium: 3.1 mmol/L — ABNORMAL LOW (ref 3.5–5.1)
Sodium: 136 mmol/L (ref 135–145)
Total Bilirubin: 1.1 mg/dL (ref 0.3–1.2)
Total Protein: 5.8 g/dL — ABNORMAL LOW (ref 6.5–8.1)

## 2022-02-20 LAB — BRAIN NATRIURETIC PEPTIDE: B Natriuretic Peptide: 990.9 pg/mL — ABNORMAL HIGH (ref 0.0–100.0)

## 2022-02-20 LAB — TSH: TSH: 1.138 u[IU]/mL (ref 0.350–4.500)

## 2022-02-20 LAB — PROTIME-INR
INR: 2.5 — ABNORMAL HIGH (ref 0.8–1.2)
Prothrombin Time: 26.5 seconds — ABNORMAL HIGH (ref 11.4–15.2)

## 2022-02-20 LAB — MAGNESIUM: Magnesium: 1.4 mg/dL — ABNORMAL LOW (ref 1.7–2.4)

## 2022-02-20 MED ORDER — FUROSEMIDE 10 MG/ML IJ SOLN
40.0000 mg | Freq: Two times a day (BID) | INTRAMUSCULAR | Status: DC
  Administered 2022-02-20: 40 mg via INTRAVENOUS
  Filled 2022-02-20: qty 4

## 2022-02-20 MED ORDER — METOPROLOL TARTRATE 25 MG PO TABS
25.0000 mg | ORAL_TABLET | Freq: Once | ORAL | Status: AC
  Administered 2022-02-20: 25 mg via ORAL
  Filled 2022-02-20: qty 1

## 2022-02-20 MED ORDER — METOPROLOL TARTRATE 50 MG PO TABS
50.0000 mg | ORAL_TABLET | Freq: Two times a day (BID) | ORAL | Status: DC
  Administered 2022-02-20: 50 mg via ORAL
  Filled 2022-02-20: qty 1

## 2022-02-20 MED ORDER — MAGNESIUM SULFATE 4 GM/100ML IV SOLN
4.0000 g | Freq: Once | INTRAVENOUS | Status: AC
  Administered 2022-02-20: 4 g via INTRAVENOUS
  Filled 2022-02-20: qty 100

## 2022-02-20 MED ORDER — METOPROLOL TARTRATE 5 MG/5ML IV SOLN
2.5000 mg | Freq: Once | INTRAVENOUS | Status: AC
  Administered 2022-02-20: 2.5 mg via INTRAVENOUS
  Filled 2022-02-20: qty 5

## 2022-02-20 MED ORDER — SODIUM CHLORIDE 0.9 % IV SOLN: 250.0000 mL | INTRAVENOUS | Status: DC | PRN

## 2022-02-20 MED ORDER — WARFARIN - PHARMACIST DOSING INPATIENT
Freq: Every day | Status: DC
  Filled 2022-02-20: qty 1

## 2022-02-20 MED ORDER — SODIUM CHLORIDE 0.9% FLUSH: 3.0000 mL | INTRAVENOUS | Status: DC | PRN

## 2022-02-20 MED ORDER — WARFARIN SODIUM 5 MG PO TABS
5.0000 mg | ORAL_TABLET | Freq: Once | ORAL | Status: AC
  Administered 2022-02-20: 5 mg via ORAL
  Filled 2022-02-20: qty 1

## 2022-02-20 MED ORDER — POTASSIUM CHLORIDE CRYS ER 20 MEQ PO TBCR
60.0000 meq | EXTENDED_RELEASE_TABLET | Freq: Once | ORAL | Status: AC
  Administered 2022-02-20: 60 meq via ORAL
  Filled 2022-02-20: qty 3

## 2022-02-20 MED ORDER — FUROSEMIDE 10 MG/ML IJ SOLN
40.0000 mg | Freq: Once | INTRAMUSCULAR | Status: AC
  Administered 2022-02-20: 40 mg via INTRAVENOUS
  Filled 2022-02-20: qty 4

## 2022-02-20 MED ORDER — SODIUM CHLORIDE 0.9% FLUSH
3.0000 mL | Freq: Two times a day (BID) | INTRAVENOUS | Status: DC
  Administered 2022-02-20 – 2022-02-25 (×10): 3 mL via INTRAVENOUS

## 2022-02-20 MED ORDER — ATORVASTATIN CALCIUM 80 MG PO TABS
80.0000 mg | ORAL_TABLET | Freq: Every day | ORAL | Status: DC
  Administered 2022-02-20 – 2022-02-25 (×6): 80 mg via ORAL
  Filled 2022-02-20 (×2): qty 1
  Filled 2022-02-20: qty 4
  Filled 2022-02-20 (×3): qty 1

## 2022-02-20 NOTE — Consult Note (Addendum)
ANTICOAGULATION CONSULT NOTE - Initial Consult  Pharmacy Consult for warfarin Indication: atrial fibrillation  Allergies  Allergen Reactions   Penicillins Rash    Patient Measurements: Height: '5\' 2"'$  (157.5 cm) Weight: 88 kg (194 lb) IBW/kg (Calculated) : 50.1  Vital Signs: Temp: 98 F (36.7 C) (10/13 1000) Temp Source: Oral (10/13 1000) BP: 126/89 (10/13 1053) Pulse Rate: 114 (10/13 1055)  Labs: Recent Labs    02/20/22 0652 02/20/22 0850  HGB 10.4*  --   HCT 33.4*  --   PLT 151  --   LABPROT 26.5*  --   INR 2.5*  --   CREATININE 0.92  --   TROPONINIHS 9 12    Estimated Creatinine Clearance: 49.4 mL/min (by C-G formula based on SCr of 0.92 mg/dL).   Medical History: Past Medical History:  Diagnosis Date   A-fib [facility])    Arthritis    Breast cancer ([place]) 11/26/2014   Cancer ([place])    breast   Intermittent palpitations    in early am    Medications:  PTA warfarin 5 mg daily Per discussion with med rec technician who interviewed the patient: pt ran out of 2.5 mg tablets and was instructed by doctor to take the 5 mg tablets. However, latest documentation I can find that specifically references warfarin dosing is from 12/2021 and patient was instructed to take 2.5 mg. Latest dispense was warfarin 5 mg tablets.  Assessment: 81 yo F with PMH paroxysmal atrial fibrillation (CHADSVASc 5, on warfarin), HFpEF, HTN presenting with palpitations and dyspnea. Pt found to be in afib with RVR (HR 110s-120s) which is likely to have also triggered a HF exacerbation. Regarding anticoagulation for afib, pt's INR currently within goal 2-3. See above Medications section for information about PTA warfarin dosing. Per chart review, pt has h/o labile INR and Eliquis has been recommended to her in the past but is difficult for patient to afford.  Drug-drug interactions: None significant at this point  Goal of Therapy:  INR 2-3   Plan:  Give warfarin 5 mg today in accordance with  home regimen Continue to dose warfarin daily given h/o labile INR Monitor INR daily Monitor CBC at least every 7 days for patients on warfarin  Dara Hoyer, PharmD PGY-1 Pharmacy Resident 02/20/2022 1:36 PM

## 2022-02-20 NOTE — H&P (Signed)
History and Physical    George Alcantar UEK:[id number] DOB: September 07, 1940 DOA: 02/20/2022  PCP: Latanya Maudlin, NP  Patient coming from: home   Chief Complaint: palpitations, dyspnea  HPI: Kristin Mitchell is a 81 y.o. female with medical history significant for paroxysmal a fib, hfpef, htn, presenting with the above.  Admitted last month for a fib with rvr, uti.  Couple of weeks ago visited PCP complaining of leg swelling and some shortness of breath. Reports that for the last few days has had worsening fatigue and shortness of breath, worse with activity or recumbence. No cough or fever or chest pain. No vomiting or diarrhea. PCP started torsemide. Reports legs remain swollen. Also with palpitations and presyncope. Because of worsening SOB husband alerted EMS. Reportedly hypoxic for them and here per EDP 88% on room air. Found to be in a fib with rvr via ems. Given 10 of dilt per EMS. In our ED also given IV metop and lasix. Currently reports feeling somewhat improved  Review of Systems: As per HPI otherwise 10 point review of systems negative.    Past Medical History:  Diagnosis Date   A-fib [facility])    Arthritis    Breast cancer ([place]) 11/26/2014   Cancer ([place])    breast   Intermittent palpitations    in early am    Past Surgical History:  Procedure Laterality Date   ABDOMINAL HYSTERECTOMY     CATARACT EXTRACTION W/PHACO Left 11/26/2014   Procedure: CATARACT EXTRACTION PHACO AND INTRAOCULAR LENS PLACEMENT ([place]);  Surgeon: Estill Cotta, MD;  Location: [facility] ORS;  Service: Ophthalmology;  Laterality: Left;  US:01:24 AP:26.6 CDE:40.16 PACk XTA:[id number] H   CHOLECYSTECTOMY     EYE SURGERY     MASTECTOMY     bilateral     reports that she has never smoked. She has never used smokeless tobacco. She reports that she does not drink alcohol and does not use drugs.  Allergies  Allergen Reactions   Penicillins Rash    Family History  Problem Relation Age of Onset    Parkinson's disease Mother    Hodgkin's lymphoma Father    Prostate cancer Neg Hx    Bladder Cancer Neg Hx    Kidney cancer Neg Hx     Prior to Admission medications   Medication Sig Start Date End Date Taking? Authorizing Provider  acetaminophen (TYLENOL) 500 MG tablet Take 500 mg by mouth every 6 (six) hours as needed.    Yes [provider]  atorvastatin (LIPITOR) 80 MG tablet Take 1 tablet (80 mg total) by mouth daily. 12/30/21  Yes Wieting, Richard, MD  Calcium Carb-Cholecalciferol (CALCIUM 600+D) 600-800 MG-UNIT TABS Take 1 tablet by mouth daily.   Yes [provider]  Cholecalciferol (VITAMIN D3) 50 MCG (2000 UT) TABS Take 1 tablet by mouth daily.   Yes [provider]  metoprolol succinate (TOPROL-XL) 50 MG 24 hr tablet Take 50 mg by mouth daily.   Yes [provider]  sodium chloride 1 g tablet Take 1 g by mouth 2 (two) times daily. 10/30/20  Yes [provider]  torsemide (DEMADEX) 5 MG tablet Take 5 mg by mouth daily. 02/03/22 02/03/23 Yes [provider]  vitamin B-12 (CYANOCOBALAMIN) 1000 MCG tablet Take 1,000 mcg by mouth daily.   Yes [provider]  warfarin (COUMADIN) 5 MG tablet Take 5 mg by mouth daily at 4 PM. 01/23/22  Yes [provider]  alendronate (FOSAMAX) 70 MG tablet Take with a full  glass of water on an empty stomach. Patient taking differently: No sig reported 07/02/20   Lequita Asal, MD  warfarin (COUMADIN) 2.5 MG tablet Take 1 tablet (2.5 mg total) by mouth daily. Patient not taking: Reported on 02/20/2022 12/31/21 01/30/22  Loletha Grayer, MD    Physical Exam: Vitals:   02/20/22 0956 02/20/22 1040 02/20/22 1053 02/20/22 1055  BP: (!) 136/101 126/89 126/89   Pulse: (!) 119 (!) 114 (!) 118 (!) 114  Resp: (!) 29 (!) 23  (!) 24  Temp:      TempSrc:      SpO2: 97% 91%  97%  Weight:      Height:        Constitutional: No acute distress Head: Atraumatic Eyes: Conjunctiva  clear ENM: Moist mucous membranes. Normal dentition.  Neck: Supple Respiratory: rales at bases Cardiovascular: tachycardic, irreg irreg Abdomen: Non-tender, non-distended. No masses. No rebound or guarding. Positive bowel sounds. Musculoskeletal: No joint deformity upper and lower extremities. Normal ROM, no contractures. Normal muscle tone.  Skin: No rashes, lesions, or ulcers.  Extremities: mod peripheral edema Neurologic: Alert, moving all 4 extremities. Psychiatric: Normal insight and judgement.   Labs on Admission: I have personally reviewed following labs and imaging studies  CBC: Recent Labs  Lab 02/20/22 0652  WBC 5.4  NEUTROABS 4.0  HGB 10.4*  HCT 33.4*  MCV 96.0  PLT 570   Basic Metabolic Panel: Recent Labs  Lab 02/20/22 0652 02/20/22 0850  NA 136  --   K 3.1*  --   CL 106  --   CO2 22  --   GLUCOSE 117*  --   BUN 16  --   CREATININE 0.92  --   CALCIUM 8.6*  --   MG  --  1.4*   GFR: Estimated Creatinine Clearance: 49.4 mL/min (by C-G formula based on SCr of 0.92 mg/dL). Liver Function Tests: Recent Labs  Lab 02/20/22 0652  AST 67*  ALT 44  ALKPHOS 107  BILITOT 1.1  PROT 5.8*  ALBUMIN 3.0*   No results for input(s): "LIPASE", "AMYLASE" in the last 168 hours. No results for input(s): "AMMONIA" in the last 168 hours. Coagulation Profile: Recent Labs  Lab 02/20/22 0652  INR 2.5*   Cardiac Enzymes: No results for input(s): "CKTOTAL", "CKMB", "CKMBINDEX", "TROPONINI" in the last 168 hours. BNP (last 3 results) No results for input(s): "PROBNP" in the last 8760 hours. HbA1C: No results for input(s): "HGBA1C" in the last 72 hours. CBG: No results for input(s): "GLUCAP" in the last 168 hours. Lipid Profile: No results for input(s): "CHOL", "HDL", "LDLCALC", "TRIG", "CHOLHDL", "LDLDIRECT" in the last 72 hours. Thyroid Function Tests: No results for input(s): "TSH", "T4TOTAL", "FREET4", "T3FREE", "THYROIDAB" in the last 72 hours. Anemia  Panel: No results for input(s): "VITAMINB12", "FOLATE", "FERRITIN", "TIBC", "IRON", "RETICCTPCT" in the last 72 hours. Urine analysis:    Component Value Date/Time   COLORURINE YELLOW (A) 01/15/2022 0518   APPEARANCEUR HAZY (A) 01/15/2022 0518   LABSPEC 1.003 (L) 01/15/2022 0518   PHURINE 6.0 01/15/2022 0518   GLUCOSEU NEGATIVE 01/15/2022 0518   HGBUR SMALL (A) 01/15/2022 0518   BILIRUBINUR NEGATIVE 01/15/2022 0518   KETONESUR NEGATIVE 01/15/2022 0518   PROTEINUR NEGATIVE 01/15/2022 0518   NITRITE NEGATIVE 01/15/2022 0518   LEUKOCYTESUR MODERATE (A) 01/15/2022 0518    Radiological Exams on Admission: DG Chest 2 View  Result Date: 02/20/2022 CLINICAL DATA:  Palpitations and shortness of breath.  Tachycardia. EXAM: CHEST - 2 VIEW COMPARISON:  One-view chest x-ray 01/15/2022 FINDINGS: The heart is enlarged, exaggerated by low lung volumes. Right perihilar and left basilar airspace opacities are present. Left greater than right pleural effusions are present. Mild interstitial edema is noted. Surgical clips are present in the axillary regions bilaterally. Remote right acromial fracture noted. IMPRESSION: 1. Cardiomegaly with mild interstitial edema and bilateral pleural effusions compatible with congestive heart failure. 2. Right perihilar and left basilar airspace disease likely reflects atelectasis. Electronically Signed   By: San Morelle M.D.   On: 02/20/2022 07:08    EKG: Independently reviewed. A fib  Assessment/Plan Principal Problem:   Atrial fibrillation with rapid ventricular response (HCC) Active Problems:   Chronic diastolic CHF (congestive heart failure) (HCC)   Essential hypertension   Breast cancer of upper-outer quadrant of right female breast ([place])   # A-fib with RVR Hemodynamically stable. Received dilt IV with EMS and IV and then oral metop here. On metop succinate 50 qd at home - increase metop tart to 50 bid - continue lasix - continue tele - f/u TTE -  consider adding dilt, would prefer to know EF before we do this - f/u tsh - coumadin, daily inr  # HFpEF with exacerbation Exacerbation likely mediated by a fib. Pulm edema, hypoxia, and elevated bnp. No chest pain and trop wnl, doubt ischemia. - lasix 40 IV bid, has had robust response to 40 thus far - TTE - metop as above  # Acute hypoxic respiratory failure 2/2 pulm edema from a fib and chf. Normal o2 and breathing comfortably on 2 L. Per EDP was 88% on room air in the ED. - [place] O2, wean as able  # Hypokalemia # Hypomagnesemia 3.1, 1.4 - kcl 60 - mg 4 g - monitor  # HTN Here bp wnl - metop as above  DVT prophylaxis: coumadin Code Status: dnr  Family Communication: husband updated telephonically 10/13  Consults called: none   Level of care: Progressive Status is: Inpatient Remains inpatient appropriate because: need for IV therapeutics    Desma Maxim MD [facility] Pager [phone]  If 7PM-7AM, please contact night-coverage [email or web address] Password [facility]  02/20/2022, 12:25 PM

## 2022-02-20 NOTE — ED Triage Notes (Signed)
Hx of afib. Reports woke this morning with feeling of palpitations and SOB. EMS reports afib RVR with HR 150's on arrival. Pt has hx of same. 10 mg dilt given en route with 300 mL NS. Pt arrives in afib with HR between 94-112. Pt alert and oriented. Denies chest pain. SOB continued. Pt hypoxic on EMS arrival. 2 L [place] in use. Pt does not use oxygen at home.

## 2022-02-20 NOTE — ED Notes (Signed)
Pt placed on cardiac monitor. EDP at bedside

## 2022-02-20 NOTE — ED Notes (Signed)
On room air, pt oxygen saturation dropped to 88%. MD [facility] aware. Pt placed on 2L nasal cannula at this time

## 2022-02-20 NOTE — ED Notes (Signed)
Patient transported to X-ray 

## 2022-02-20 NOTE — ED Provider Notes (Signed)
[facility] Provider Note    Event Date/Time   First MD Initiated Contact with Patient 02/20/22 [phone]     (approximate)   History   Atrial Fibrillation   HPI  Kristin Mitchell is a 81 y.o. female with history of hypertension, hyperlipidemia, TIA, breast cancer status post bilateral mastectomy, A-fib on Coumadin, CKD, CHF who comes in with concerns for elevated heart rates.  I reviewed the records where patient was seen on 9/9 for elevated heart rates treated with IV diltiazem and discharged on p.o. metoprolol and warfarin.  She was also treated for UTI.  Patient reports that she woke up feeling palpitations and short of breath.  Patient was given 10 mg of diltiazem patient noted to be hypoxic on EMS arrival 2 L of nasal cannula was placed.  Patient does not typically use oxygen at home.  Patient reports that she was recently started on torsemide due to some leg swelling.  She reports that she did not take it the last 2 days due to being out and about and not wanting to have to pee.  She does report being on her metoprolol that she takes at nighttime.  Denies any missed doses of this or her warfarin.  She reports being in chronic atrial fibrillation.   Physical Exam   Triage Vital Signs: ED Triage Vitals  Enc Vitals Group     BP 02/20/22 0640 131/86     Pulse Rate 02/20/22 0644 84     Resp 02/20/22 0640 20     Temp 02/20/22 0640 98.1 F (36.7 C)     Temp Source 02/20/22 0640 Oral     SpO2 02/20/22 0644 (!) 38 %     Weight 02/20/22 0639 194 lb (88 kg)     Height 02/20/22 0639 '5\' 2"'$  (1.575 m)     Head Circumference --      Peak Flow --      Pain Score 02/20/22 0639 0     Pain Loc --      Pain Edu? --      Excl. in [place]? --     Most recent vital signs: Vitals:   02/20/22 0652 02/20/22 0822  BP:  (!) 134/101  Pulse:  (!) 123  Resp:  20  Temp:    SpO2: 93% 99%     General: Awake, no distress.  CV:  Good peripheral perfusion.  Irregular,  tachycardic Resp:  Normal effort.  On 2 L Abd:  No distention.  Other:  No swelling the legs.   ED Results / Procedures / Treatments   Labs (all labs ordered are listed, but only abnormal results are displayed) Labs Reviewed  CBC WITH DIFFERENTIAL/PLATELET - Abnormal; Notable for the following components:      Result Value   RBC 3.48 (*)    Hemoglobin 10.4 (*)    HCT 33.4 (*)    RDW 16.0 (*)    All other components within normal limits  COMPREHENSIVE METABOLIC PANEL - Abnormal; Notable for the following components:   Potassium 3.1 (*)    Glucose, Bld 117 (*)    Calcium 8.6 (*)    Total Protein 5.8 (*)    Albumin 3.0 (*)    AST 67 (*)    All other components within normal limits  TROPONIN I (HIGH SENSITIVITY)  TROPONIN I (HIGH SENSITIVITY)     EKG  My interpretation of EKG:  Atrial fibrillation rate of 82 without any ST elevation or  T wave inversions except aVL, normal intervals  RADIOLOGY I have reviewed the xray personally and interpreted and patient has findings of pulmonary edema as well as a left pleural effusion  PROCEDURES:  Critical Care performed: Yes, see critical care procedure note(s)  .1-3 Lead EKG Interpretation  Performed by: Vanessa Hartline, MD Authorized by: Vanessa [place], MD     Interpretation: abnormal     ECG rate:  120   ECG rate assessment: tachycardic     Rhythm: atrial fibrillation     Ectopy: none     Conduction: normal   .Critical Care  Performed by: Vanessa [place], MD Authorized by: Vanessa [place], MD   Critical care provider statement:    Critical care time (minutes):  30   Critical care was necessary to treat or prevent imminent or life-threatening deterioration of the following conditions:  Cardiac failure   Critical care was time spent personally by me on the following activities:  Development of treatment plan with patient or surrogate, discussions with consultants, evaluation of patient's response to treatment, examination of  patient, ordering and review of laboratory studies, ordering and review of radiographic studies, ordering and performing treatments and interventions, pulse oximetry, re-evaluation of patient's condition and review of old charts    MEDICATIONS ORDERED IN ED: Medications  metoprolol tartrate (LOPRESSOR) injection 2.5 mg (has no administration in time range)  furosemide (LASIX) injection 40 mg (has no administration in time range)     IMPRESSION / MDM / ASSESSMENT AND PLAN / ED COURSE  I reviewed the triage vital signs and the nursing notes.   Patient's presentation is most consistent with acute presentation with potential threat to life or bodily function.   Patient came in with A-fib with RVR initially treated in the field with IV diltiazem so heart rates were in the 80s on initial EKG so was in the waiting room for 3 hours but upon returning to a room patient's heart rates have climbed to 120-1 130.  She does report feeling some palpitations and increased shortness of breath.  She reports being compliant with her medications other than the torsemide she held for 2 days.  Denies any falls or bleeding issues.   Tropes are negative x2.  CBC shows stable hemoglobin.  CMP shows low potassium of 3.1 we will give some IV and oral repletion  We will give patient 2.5 of IV metoprolol as well as a dose of IV Lasix given chest x-ray shows pulmonary edema.  Patient is placed on 2 L of oxygen due to hypoxia with EMS.   Heart rates have come down with the metoprolol to less than 110.  We will give some oral metoprolol and discussed with the hospitalist for admission   The patient is on the cardiac monitor to evaluate for evidence of arrhythmia and/or significant heart rate changes.      FINAL CLINICAL IMPRESSION(S) / ED DIAGNOSES   Final diagnoses:  Atrial fibrillation with rapid ventricular response (HCC)  Acute congestive heart failure, unspecified heart failure type ([place])  Acute  respiratory failure with hypoxia ([place])     Rx / DC Orders   ED Discharge Orders     None        Note:  This document was prepared using Dragon voice recognition software and may include unintentional dictation errors.   Vanessa Hughes, MD 02/20/22 1020

## 2022-02-20 NOTE — Evaluation (Addendum)
Physical Therapy Evaluation Patient Details Name: Kristin Mitchell MRN: [id number] DOB: Sep 24, 1940 Today's Date: 02/20/2022  History of Present Illness  Pt is an 81 y/o F admitted on 02/20/22 after presenting with c/c of palpitations & dyspnea. Pt is being treated for a-fib with RVR. PMH: paroxysmal a-fib, HFpEF, HTN, breast CA, arthritis  Clinical Impression  Pt seen for PT evaluation with pt agreeable to tx. Pt reports prior to admission she was ambulatory with rollator for short distances. On this date, pt requires supplemental O2, on 2L/min with lowest SpO2 88%. Pt is able to complete supine<>sit with HOB elevated & use of step stool to get in elevated bed (pt reports this is what she does at home too) with supervision. Pt is able to ambulate short distance in room with RW & supervision with decreased step length, decreased stride length, & decreased gait speed. Pt reports she is not far from her baseline but will continue to follow pt acutely to address balance, endurance & gait with LRAD. Will also recommend HHPT f/u 2/2 pt having hx of falls.      Recommendations for follow up therapy are one component of a multi-disciplinary discharge planning process, led by the attending physician.  Recommendations may be updated based on patient status, additional functional criteria and insurance authorization.  Follow Up Recommendations Home health PT      Assistance Recommended at Discharge Intermittent Supervision/Assistance  Patient can return home with the following  A little help with bathing/dressing/bathroom;A little help with walking and/or transfers;Assistance with cooking/housework;Help with stairs or ramp for entrance    Equipment Recommendations None recommended by PT  Recommendations for Other Services       Functional Status Assessment Patient has had a recent decline in their functional status and demonstrates the ability to make significant improvements in function in a  reasonable and predictable amount of time.     Precautions / Restrictions Precautions Precautions: Fall Restrictions Weight Bearing Restrictions: No      Mobility  Bed Mobility Overal bed mobility: Needs Assistance Bed Mobility: Supine to Sit, Sit to Supine     Supine to sit: Supervision, HOB elevated Sit to supine: Min guard, Supervision, HOB elevated   General bed mobility comments: extra time, use of BUE to elevate BLE onto bed, uses step stool to transfer in to high bed    Transfers Overall transfer level: Needs assistance Equipment used: Rolling walker (2 wheels) Transfers: Sit to/from Stand Sit to Stand: Supervision           General transfer comment: STS from elevated EOB    Ambulation/Gait Ambulation/Gait assistance: Supervision Gait Distance (Feet): 20 Feet Assistive device: Rolling walker (2 wheels) Gait Pattern/deviations: Decreased step length - right, Decreased step length - left, Decreased stride length, Decreased dorsiflexion - right, Decreased dorsiflexion - left Gait velocity: decreased        Stairs            Wheelchair Mobility    Modified Rankin (Stroke Patients Only)       Balance Overall balance assessment: Needs assistance Sitting-balance support: Feet supported, Bilateral upper extremity supported Sitting balance-Leahy Scale: Good     Standing balance support: Bilateral upper extremity supported, During functional activity, Reliant on assistive device for balance Standing balance-Leahy Scale: Good                               Pertinent Vitals/Pain Pain Assessment Pain Assessment: No/denies  pain    Home Living Family/patient expects to be discharged to:: Private residence Living Arrangements: Spouse/significant other Available Help at Discharge: Family;Available 24 hours/day Type of Home: House Home Access: Level entry       Home Layout: One level Home Equipment: Rollator (4 wheels);Rolling Walker  (2 wheels);Shower seat - built in;Grab bars - tub/shower      Prior Function Prior Level of Function : Independent/Modified Independent             Mobility Comments: Pt reports she's ambulatory with rollator, uses step stool to get in/out of elevated EOB. Reports 3 falls in the past 6 months.       Hand Dominance        Extremity/Trunk Assessment   Upper Extremity Assessment Upper Extremity Assessment: Overall WFL for tasks assessed    Lower Extremity Assessment Lower Extremity Assessment: Overall WFL for tasks assessed;Generalized weakness (pt with large BLE habitus, reports mild edema)       Communication   Communication: No difficulties  Cognition Arousal/Alertness: Awake/alert Behavior During Therapy: WFL for tasks assessed/performed Overall Cognitive Status: Within Functional Limits for tasks assessed                                          General Comments General comments (skin integrity, edema, etc.): Pt on 2L/min via nasal cannula with SpO2 as low as 88%, max HR 127 bpm    Exercises     Assessment/Plan    PT Assessment Patient needs continued PT services  PT Problem List Decreased strength;Decreased mobility;Decreased range of motion;Decreased activity tolerance;Decreased balance;Decreased knowledge of use of DME;Cardiopulmonary status limiting activity       PT Treatment Interventions DME instruction;Therapeutic exercise;Balance training;Neuromuscular re-education;Gait training;Functional mobility training;Therapeutic activities    PT Goals (Current goals can be found in the Care Plan section)  Acute Rehab PT Goals Patient Stated Goal: get better PT Goal Formulation: With patient Time For Goal Achievement: 03/06/22 Potential to Achieve Goals: Good    Frequency Min 2X/week     Co-evaluation               AM-PAC PT "6 Clicks" Mobility  Outcome Measure Help needed turning from your back to your side while in a flat bed  without using bedrails?: None Help needed moving from lying on your back to sitting on the side of a flat bed without using bedrails?: None Help needed moving to and from a bed to a chair (including a wheelchair)?: A Little Help needed standing up from a chair using your arms (e.g., wheelchair or bedside chair)?: None Help needed to walk in hospital room?: A Little Help needed climbing 3-5 steps with a railing? : A Little 6 Click Score: 21    End of Session Equipment Utilized During Treatment: Oxygen Activity Tolerance: Patient tolerated treatment well Patient left: in bed;with call bell/phone within reach   PT Visit Diagnosis: Muscle weakness (generalized) (M62.81);History of falling (Z91.81)    Time: 8338-2505 PT Time Calculation (min) (ACUTE ONLY): 14 min   Charges:   PT Evaluation $PT Eval Moderate Complexity: [place], PT, DPT 02/20/22, 4:02 PM   Waunita Schooner 02/20/2022, 4:00 PM

## 2022-02-20 NOTE — ED Notes (Signed)
New oxygen tank hooked up to patients tubing

## 2022-02-21 DIAGNOSIS — I4891 Unspecified atrial fibrillation: Secondary | ICD-10-CM | POA: Diagnosis not present

## 2022-02-21 LAB — BASIC METABOLIC PANEL
Anion gap: 8 (ref 5–15)
BUN: 21 mg/dL (ref 8–23)
CO2: 25 mmol/L (ref 22–32)
Calcium: 9 mg/dL (ref 8.9–10.3)
Chloride: 100 mmol/L (ref 98–111)
Creatinine, Ser: 1.06 mg/dL — ABNORMAL HIGH (ref 0.44–1.00)
GFR, Estimated: 53 mL/min — ABNORMAL LOW (ref >60)
Glucose, Bld: 94 mg/dL (ref 70–99)
Potassium: 3.7 mmol/L (ref 3.5–5.1)
Sodium: 133 mmol/L — ABNORMAL LOW (ref 135–145)

## 2022-02-21 LAB — PROTIME-INR
INR: 2.7 — ABNORMAL HIGH (ref 0.8–1.2)
Prothrombin Time: 28.8 seconds — ABNORMAL HIGH (ref 11.4–15.2)

## 2022-02-21 LAB — MAGNESIUM: Magnesium: 1.8 mg/dL (ref 1.7–2.4)

## 2022-02-21 MED ORDER — POTASSIUM CHLORIDE CRYS ER 20 MEQ PO TBCR: 40.0000 meq | EXTENDED_RELEASE_TABLET | Freq: Once | ORAL | Status: DC

## 2022-02-21 MED ORDER — DILTIAZEM HCL-DEXTROSE 125-5 MG/125ML-% IV SOLN (PREMIX)
5.0000 mg/h | INTRAVENOUS | Status: DC
  Administered 2022-02-21: 5 mg/h via INTRAVENOUS
  Filled 2022-02-21: qty 125

## 2022-02-21 MED ORDER — FUROSEMIDE 10 MG/ML IJ SOLN
20.0000 mg | Freq: Two times a day (BID) | INTRAMUSCULAR | Status: DC
  Administered 2022-02-21: 20 mg via INTRAVENOUS
  Filled 2022-02-21: qty 4

## 2022-02-21 MED ORDER — ORAL CARE MOUTH RINSE: 15.0000 mL | OROMUCOSAL | Status: DC | PRN

## 2022-02-21 MED ORDER — METOPROLOL TARTRATE 50 MG PO TABS
100.0000 mg | ORAL_TABLET | Freq: Two times a day (BID) | ORAL | Status: DC
  Administered 2022-02-21: 100 mg via ORAL
  Filled 2022-02-21: qty 2

## 2022-02-21 MED ORDER — WARFARIN SODIUM 5 MG PO TABS
5.0000 mg | ORAL_TABLET | Freq: Once | ORAL | Status: AC
  Administered 2022-02-21: 5 mg via ORAL
  Filled 2022-02-21: qty 1

## 2022-02-21 MED ORDER — DILTIAZEM LOAD VIA INFUSION
10.0000 mg | Freq: Once | INTRAVENOUS | Status: AC
  Administered 2022-02-21: 10 mg via INTRAVENOUS
  Filled 2022-02-21: qty 10

## 2022-02-21 MED ORDER — MAGNESIUM SULFATE IN D5W 1-5 GM/100ML-% IV SOLN
1.0000 g | Freq: Once | INTRAVENOUS | Status: AC
  Administered 2022-02-21: 1 g via INTRAVENOUS
  Filled 2022-02-21: qty 100

## 2022-02-21 MED ORDER — POTASSIUM CHLORIDE CRYS ER 20 MEQ PO TBCR
40.0000 meq | EXTENDED_RELEASE_TABLET | Freq: Once | ORAL | Status: AC
  Administered 2022-02-21: 40 meq via ORAL
  Filled 2022-02-21: qty 2

## 2022-02-21 MED ORDER — DILTIAZEM HCL 30 MG PO TABS
30.0000 mg | ORAL_TABLET | Freq: Four times a day (QID) | ORAL | Status: DC
  Filled 2022-02-21: qty 1

## 2022-02-21 NOTE — Progress Notes (Addendum)
PROGRESS NOTE    Kristin Mitchell  HYQ:[id number] DOB: 06-16-1940 DOA: 02/20/2022 PCP: Latanya Maudlin, NP  Outpatient Specialists: cardiology    Brief Narrative:   From admission h and p Admitted last month for a fib with rvr, uti.   Couple of weeks ago visited PCP complaining of leg swelling and some shortness of breath. Reports that for the last few days has had worsening fatigue and shortness of breath, worse with activity or recumbence. No cough or fever or chest pain. No vomiting or diarrhea. PCP started torsemide. Reports legs remain swollen. Also with palpitations and presyncope. Because of worsening SOB husband alerted EMS. Reportedly hypoxic for them and here per EDP 88% on room air. Found to be in a fib with rvr via ems. Given 10 of dilt per EMS. In our ED also given IV metop and lasix. Currently reports feeling somewhat improved   Assessment & Plan:   Principal Problem:   Atrial fibrillation with rapid ventricular response (HCC) Active Problems:   Chronic diastolic CHF (congestive heart failure) (HCC)   Essential hypertension   Breast cancer of upper-outer quadrant of right female breast ([place])   # A-fib with RVR Hemodynamically stable. Received dilt IV with EMS and IV and then oral metop here. On metop succinate 50 qd at home. Metoprolol up-titrated yesterday but today given chf and lack of control with metop will switch to diltiazam. Normal EF 2 months ago so think safe to proceed. - dilt bolus and gtt - continue lasix as below - continue tele - f/u TTE - coumadin per pharmacy, daily inr - PT advises [facility]   # HFpEF with exacerbation Exacerbation likely mediated by a fib. Pulm edema, hypoxia, and elevated bnp. No chest pain and trop wnl, doubt ischemia. Robust response to lasix 40 IV x2, out 4.9 liters, symptomatically improved - reduce lasix to 20 IV bid - TTE pending - dilt as above   # Acute hypoxic respiratory failure 2/2 pulm edema from a fib and  chf. Normal o2 and breathing comfortably on 2 L on admission. Per EDP was 88% on room air in the ED. This morning mid-90s and symptomatically much improved - monitor   # Hypokalemia # Hypomagnesemia Resolved with repletion - monitor   # HTN Here bp wnl  DVT prophylaxis: home coumadin Code Status: dnr Family Communication: husband updated telephonically 10/14  Level of care: Progressive Status is: Inpatient Remains inpatient appropriate because: need for IV diuresis    Consultants:  none  Procedures: none  Antimicrobials:  none    Subjective: Reports breathing much improved. Tolerating diet, no chest pain  Objective: Vitals:   02/21/22 0400 02/21/22 0401 02/21/22 0500 02/21/22 0600  BP: 119/74 119/74 100/60 (!) 114/99  Pulse: 98 (!) 137 90 (!) 109  Resp: 19 (!) 21 14 (!) 21  Temp:  97.8 F (36.6 C)    TempSrc:  Oral    SpO2: 96% 95% 96% 99%  Weight:      Height:        Intake/Output Summary (Last 24 hours) at 02/21/2022 0829 Last data filed at 02/21/2022 0407 Gross per 24 hour  Intake --  Output 4900 ml  Net -4900 ml   Filed Weights   02/20/22 0639  Weight: 88 kg    Examination:  Constitutional: No acute distress Head: Atraumatic Eyes: Conjunctiva clear ENM: Moist mucous membranes. Normal dentition.  Respiratory: rales at bases Cardiovascular: tachycardic, irreg irreg Abdomen: Non-tender, non-distended. No masses. No rebound or  guarding. Positive bowel sounds. Musculoskeletal: No joint deformity upper and lower extremities. Normal ROM, no contractures. Normal muscle tone.  Skin: No rashes, lesions, or ulcers.  Extremities: mod peripheral edema Neurologic: Alert, moving all 4 extremities. Psychiatric: Normal insight and judgement.    Data Reviewed: I have personally reviewed following labs and imaging studies  CBC: Recent Labs  Lab 02/20/22 0652  WBC 5.4  NEUTROABS 4.0  HGB 10.4*  HCT 33.4*  MCV 96.0  PLT 825   Basic Metabolic  Panel: Recent Labs  Lab 02/20/22 0652 02/20/22 0850 02/21/22 0403 02/21/22 0412  NA 136  --  133*  --   K 3.1*  --  3.7  --   CL 106  --  100  --   CO2 22  --  25  --   GLUCOSE 117*  --  94  --   BUN 16  --  21  --   CREATININE 0.92  --  1.06*  --   CALCIUM 8.6*  --  9.0  --   MG  --  1.4*  --  1.8   GFR: Estimated Creatinine Clearance: 42.9 mL/min (A) (by C-G formula based on SCr of 1.06 mg/dL (H)). Liver Function Tests: Recent Labs  Lab 02/20/22 0652  AST 67*  ALT 44  ALKPHOS 107  BILITOT 1.1  PROT 5.8*  ALBUMIN 3.0*   No results for input(s): "LIPASE", "AMYLASE" in the last 168 hours. No results for input(s): "AMMONIA" in the last 168 hours. Coagulation Profile: Recent Labs  Lab 02/20/22 0652 02/21/22 0403  INR 2.5* 2.7*   Cardiac Enzymes: No results for input(s): "CKTOTAL", "CKMB", "CKMBINDEX", "TROPONINI" in the last 168 hours. BNP (last 3 results) No results for input(s): "PROBNP" in the last 8760 hours. HbA1C: No results for input(s): "HGBA1C" in the last 72 hours. CBG: No results for input(s): "GLUCAP" in the last 168 hours. Lipid Profile: No results for input(s): "CHOL", "HDL", "LDLCALC", "TRIG", "CHOLHDL", "LDLDIRECT" in the last 72 hours. Thyroid Function Tests: Recent Labs    02/20/22 1127  TSH 1.138   Anemia Panel: No results for input(s): "VITAMINB12", "FOLATE", "FERRITIN", "TIBC", "IRON", "RETICCTPCT" in the last 72 hours. Urine analysis:    Component Value Date/Time   COLORURINE YELLOW (A) 01/15/2022 0518   APPEARANCEUR HAZY (A) 01/15/2022 0518   LABSPEC 1.003 (L) 01/15/2022 0518   PHURINE 6.0 01/15/2022 0518   GLUCOSEU NEGATIVE 01/15/2022 0518   HGBUR SMALL (A) 01/15/2022 0518   BILIRUBINUR NEGATIVE 01/15/2022 0518   KETONESUR NEGATIVE 01/15/2022 0518   PROTEINUR NEGATIVE 01/15/2022 0518   NITRITE NEGATIVE 01/15/2022 0518   LEUKOCYTESUR MODERATE (A) 01/15/2022 0518   Sepsis Labs: '@LABRCNTIP'$ (procalcitonin:4,lacticidven:4)  )No  results found for this or any previous visit (from the past 240 hour(s)).       Radiology Studies: DG Chest 2 View  Result Date: 02/20/2022 CLINICAL DATA:  Palpitations and shortness of breath.  Tachycardia. EXAM: CHEST - 2 VIEW COMPARISON:  One-view chest x-ray 01/15/2022 FINDINGS: The heart is enlarged, exaggerated by low lung volumes. Right perihilar and left basilar airspace opacities are present. Left greater than right pleural effusions are present. Mild interstitial edema is noted. Surgical clips are present in the axillary regions bilaterally. Remote right acromial fracture noted. IMPRESSION: 1. Cardiomegaly with mild interstitial edema and bilateral pleural effusions compatible with congestive heart failure. 2. Right perihilar and left basilar airspace disease likely reflects atelectasis. Electronically Signed   By: San Morelle M.D.   On: 02/20/2022 07:08  Scheduled Meds:  atorvastatin  80 mg Oral QHS   metoprolol tartrate  50 mg Oral BID   sodium chloride flush  3 mL Intravenous Q12H   Warfarin - Pharmacist Dosing Inpatient   Does not apply q1600   Continuous Infusions:  sodium chloride       LOS: 1 day     Desma Maxim, MD [facility]   If 7PM-7AM, please contact night-coverage [email or web address] Password [email or web address] 02/21/2022, 8:29 AM

## 2022-02-21 NOTE — Plan of Care (Signed)
Patient remains on diltiazem infusion for HR control. 2 L/min of supplemental O2 via [place] for comfort.   Problem: Education: Goal: Knowledge of General Education information will improve Description: Including pain rating scale, medication(s)/side effects and non-pharmacologic comfort measures Outcome: Progressing   Problem: Health Behavior/Discharge Planning: Goal: Ability to manage health-related needs will improve Outcome: Progressing   Problem: Clinical Measurements: Goal: Ability to maintain clinical measurements within normal limits will improve Outcome: Progressing Goal: Will remain free from infection Outcome: Progressing Goal: Diagnostic test results will improve Outcome: Progressing Goal: Respiratory complications will improve Outcome: Progressing Goal: Cardiovascular complication will be avoided Outcome: Progressing   Problem: Activity: Goal: Risk for activity intolerance will decrease Outcome: Progressing   Problem: Nutrition: Goal: Adequate nutrition will be maintained Outcome: Progressing   Problem: Coping: Goal: Level of anxiety will decrease Outcome: Progressing   Problem: Elimination: Goal: Will not experience complications related to bowel motility Outcome: Progressing Goal: Will not experience complications related to urinary retention Outcome: Progressing   Problem: Pain Managment: Goal: General experience of comfort will improve Outcome: Progressing   Problem: Safety: Goal: Ability to remain free from injury will improve Outcome: Progressing   Problem: Skin Integrity: Goal: Risk for impaired skin integrity will decrease Outcome: Progressing

## 2022-02-21 NOTE — ED Notes (Signed)
Provider Si Raider, MD made aware of pt's HR 135.

## 2022-02-21 NOTE — Consult Note (Signed)
ANTICOAGULATION CONSULT NOTE - Initial Consult  Pharmacy Consult for warfarin Indication: atrial fibrillation  Allergies  Allergen Reactions   Penicillins Rash    Patient Measurements: Height: '5\' 2"'$  (157.5 cm) Weight: 88 kg (194 lb) IBW/kg (Calculated) : 50.1  Vital Signs: Temp: 98 F (36.7 C) (10/14 0900) Temp Source: Oral (10/14 0401) BP: 94/53 (10/14 0900) Pulse Rate: 95 (10/14 0900)  Labs: Recent Labs    02/20/22 0652 02/20/22 0850 02/21/22 0403  HGB 10.4*  --   --   HCT 33.4*  --   --   PLT 151  --   --   LABPROT 26.5*  --  28.8*  INR 2.5*  --  2.7*  CREATININE 0.92  --  1.06*  TROPONINIHS 9 12  --      Estimated Creatinine Clearance: 42.9 mL/min (A) (by C-G formula based on SCr of 1.06 mg/dL (H)).   Medical History: Past Medical History:  Diagnosis Date   A-fib [facility])    Arthritis    Breast cancer ([place]) 11/26/2014   Cancer ([place])    breast   Intermittent palpitations    in early am    Medications:  PTA warfarin 5 mg daily Per discussion with med rec technician who interviewed the patient: pt ran out of 2.5 mg tablets and was instructed by doctor to take the 5 mg tablets. However, latest documentation I can find that specifically references warfarin dosing is from 12/2021 and patient was instructed to take 2.5 mg. Latest dispense was warfarin 5 mg tablets.  Assessment: 81 yo F with PMH paroxysmal atrial fibrillation (CHADSVASc 5, on warfarin), HFpEF, HTN presenting with palpitations and dyspnea. Pt found to be in afib with RVR (HR 110s-120s) which is likely to have also triggered a HF exacerbation. Regarding anticoagulation for afib, pt's INR currently within goal 2-3. See above Medications section for information about PTA warfarin dosing. Per chart review, pt has h/o labile INR and Eliquis has been recommended to her in the past but is difficult for patient to afford.  Drug-drug interactions: None significant at this  point  Date Dose INR Comment 10/13 '5mg'$  2.5      10/14 '5mg'$  2.7 (Delta 0.2); therapeutic   Goal of Therapy:  INR 2-3   Plan:  INR therapeutic, continue with home regimen of warfarin 5 mg daily. Will continue to assess dose with INR daily, given h/o labile INRs Monitor INR daily Monitor CBC at least every 7 days for patients on warfarin  Lorna Dibble, PharmD, [facility] Clinical Pharmacist 02/21/2022 11:19 AM

## 2022-02-22 ENCOUNTER — Inpatient Hospital Stay
Admit: 2022-02-22 | Discharge: 2022-02-22 | Disposition: A | Discharge status: Home / Self Care | Payer: Medicare Other | Attending: Obstetrics and Gynecology | Admitting: Obstetrics and Gynecology

## 2022-02-22 DIAGNOSIS — I4891 Unspecified atrial fibrillation: Secondary | ICD-10-CM | POA: Diagnosis not present

## 2022-02-22 LAB — OSMOLALITY: Osmolality: 272 mOsm/kg — ABNORMAL LOW (ref 275–295)

## 2022-02-22 LAB — ECHOCARDIOGRAM COMPLETE
AR max vel: 1.23 cm2
AV Peak grad: 7.6 mmHg
Ao pk vel: 1.38 m/s
Area-P 1/2: 3.63 cm2
Calc EF: 40.1 %
Height: 62 in
MV M vel: 5.15 m/s
MV Peak grad: 106.1 mmHg
Radius: 0.4 cm
S' Lateral: 3.5 cm
Single Plane A2C EF: 38.7 %
Single Plane A4C EF: 44.1 %
Weight: 2987.67 oz

## 2022-02-22 LAB — BASIC METABOLIC PANEL
Anion gap: 9 (ref 5–15)
Anion gap: 6 (ref 5–15)
BUN: 19 mg/dL (ref 8–23)
BUN: 22 mg/dL (ref 8–23)
CO2: 24 mmol/L (ref 22–32)
CO2: 24 mmol/L (ref 22–32)
Calcium: 8.3 mg/dL — ABNORMAL LOW (ref 8.9–10.3)
Calcium: 8.1 mg/dL — ABNORMAL LOW (ref 8.9–10.3)
Chloride: 94 mmol/L — ABNORMAL LOW (ref 98–111)
Chloride: 94 mmol/L — ABNORMAL LOW (ref 98–111)
Creatinine, Ser: 1.05 mg/dL — ABNORMAL HIGH (ref 0.44–1.00)
Creatinine, Ser: 1.18 mg/dL — ABNORMAL HIGH (ref 0.44–1.00)
GFR, Estimated: 53 mL/min — ABNORMAL LOW (ref >60)
GFR, Estimated: 46 mL/min — ABNORMAL LOW (ref >60)
Glucose, Bld: 86 mg/dL (ref 70–99)
Glucose, Bld: 117 mg/dL — ABNORMAL HIGH (ref 70–99)
Potassium: 3.8 mmol/L (ref 3.5–5.1)
Potassium: 3.7 mmol/L (ref 3.5–5.1)
Sodium: 127 mmol/L — ABNORMAL LOW (ref 135–145)
Sodium: 124 mmol/L — ABNORMAL LOW (ref 135–145)

## 2022-02-22 LAB — SODIUM, URINE, RANDOM: Sodium, Ur: 62 mmol/L

## 2022-02-22 LAB — BRAIN NATRIURETIC PEPTIDE: B Natriuretic Peptide: 317.4 pg/mL — ABNORMAL HIGH (ref 0.0–100.0)

## 2022-02-22 LAB — PROTIME-INR
INR: 3.1 — ABNORMAL HIGH (ref 0.8–1.2)
Prothrombin Time: 32 seconds — ABNORMAL HIGH (ref 11.4–15.2)

## 2022-02-22 MED ORDER — FUROSEMIDE 10 MG/ML IJ SOLN
20.0000 mg | Freq: Every day | INTRAMUSCULAR | Status: DC
  Administered 2022-02-22: 20 mg via INTRAVENOUS
  Filled 2022-02-22: qty 2

## 2022-02-22 MED ORDER — LOSARTAN POTASSIUM 25 MG PO TABS
25.0000 mg | ORAL_TABLET | Freq: Every day | ORAL | Status: DC
  Filled 2022-02-22: qty 1

## 2022-02-22 MED ORDER — MIDODRINE HCL 5 MG PO TABS
2.5000 mg | ORAL_TABLET | Freq: Three times a day (TID) | ORAL | Status: DC
  Administered 2022-02-22 – 2022-02-23 (×3): 2.5 mg via ORAL
  Filled 2022-02-22 (×3): qty 1

## 2022-02-22 MED ORDER — METOPROLOL TARTRATE 25 MG PO TABS
25.0000 mg | ORAL_TABLET | Freq: Two times a day (BID) | ORAL | Status: DC
  Administered 2022-02-22 (×2): 25 mg via ORAL
  Filled 2022-02-22 (×2): qty 1

## 2022-02-22 MED ORDER — HYDRALAZINE HCL 25 MG PO TABS
12.5000 mg | ORAL_TABLET | Freq: Three times a day (TID) | ORAL | Status: DC
  Administered 2022-02-22 – 2022-02-23 (×2): 12.5 mg via ORAL
  Filled 2022-02-22 (×2): qty 1

## 2022-02-22 NOTE — Consult Note (Signed)
ANTICOAGULATION CONSULT NOTE - Initial Consult  Pharmacy Consult for warfarin Indication: atrial fibrillation  Allergies  Allergen Reactions   Penicillins Rash    Patient Measurements: Height: '5\' 2"'$  (157.5 cm) Weight: 84.7 kg (186 lb 11.7 oz) IBW/kg (Calculated) : 50.1  Vital Signs: Temp: 98.2 F (36.8 C) (10/15 0807) Temp Source: Oral (10/15 0400) BP: 96/56 (10/15 0807) Pulse Rate: 94 (10/15 0807)  Labs: Recent Labs    02/20/22 0652 02/20/22 0850 02/21/22 0403 02/22/22 0508  HGB 10.4*  --   --   --   HCT 33.4*  --   --   --   PLT 151  --   --   --   LABPROT 26.5*  --  28.8* 32.0*  INR 2.5*  --  2.7* 3.1*  CREATININE 0.92  --  1.06* 1.05*  TROPONINIHS 9 12  --   --      Estimated Creatinine Clearance: 42.4 mL/min (A) (by C-G formula based on SCr of 1.05 mg/dL (H)).   Medical History: Past Medical History:  Diagnosis Date   A-fib [facility])    Arthritis    Breast cancer ([place]) 11/26/2014   Cancer ([place])    breast   Intermittent palpitations    in early am    Medications:  PTA warfarin 5 mg daily Per discussion with med rec technician who interviewed the patient: pt ran out of 2.5 mg tablets and was instructed by doctor to take the 5 mg tablets. However, latest documentation I can find that specifically references warfarin dosing is from 12/2021 and patient was instructed to take 2.5 mg. Latest dispense was warfarin 5 mg tablets.  Assessment: 81 yo F with PMH paroxysmal atrial fibrillation (CHADSVASc 5, on warfarin), HFpEF, HTN presenting with palpitations and dyspnea. Pt found to be in afib with RVR (HR 110s-120s) which is likely to have also triggered a HF exacerbation. Regarding anticoagulation for afib, pt's INR currently within goal 2-3. See above Medications section for information about PTA warfarin dosing. Per chart review, pt has h/o labile INR and Eliquis has been recommended to her in the past but is difficult for patient to afford.  Drug-drug  interactions: Minor inhibitory effect of diltiazem on warfarin could increase INR.  Date Dose INR Comment 10/13 '5mg'$  2.5      10/14 '5mg'$  2.7 (Delta 0.2); therapeutic 10/15 Zero 3.1 (Delta 0.4); slightly supratherapeutic 10/16   Goal of Therapy:  INR 2-3   Plan:  INR slightly supratherapeutic at 3.1, INR uptrending quickly (delta 0.4) on warfarin 5 mg daily.  Will hold single dose on 10/15 PM and reduce to 2.'5mg'$  daily thereafter pending next INR.  Pt on Med Rec stated '5mg'$  daily, however per chart review:  last hospital discharge note on 02/05/2022 states '5mg'$  x1; then 2.'5mg'$  daily. Last clinic note before this on 12/2021 also states 2.'5mg'$  daily. Monitor INR daily (h/o labile INRs) Monitor CBC at least every 7 days for patients on warfarin  Lorna Dibble, PharmD, [facility] Clinical Pharmacist 02/22/2022 11:29 AM

## 2022-02-22 NOTE — Plan of Care (Signed)
Patient no longer requiring supplemental O2. Patient has been transitioned off of Diltiazem infusion. Patient remains in PCU.   Problem: Education: Goal: Knowledge of General Education information will improve Description: Including pain rating scale, medication(s)/side effects and non-pharmacologic comfort measures Outcome: Progressing   Problem: Health Behavior/Discharge Planning: Goal: Ability to manage health-related needs will improve Outcome: Progressing   Problem: Clinical Measurements: Goal: Ability to maintain clinical measurements within normal limits will improve Outcome: Progressing Goal: Will remain free from infection Outcome: Progressing Goal: Diagnostic test results will improve Outcome: Progressing Goal: Respiratory complications will improve Outcome: Progressing Goal: Cardiovascular complication will be avoided Outcome: Progressing   Problem: Activity: Goal: Risk for activity intolerance will decrease Outcome: Progressing   Problem: Nutrition: Goal: Adequate nutrition will be maintained Outcome: Progressing   Problem: Coping: Goal: Level of anxiety will decrease Outcome: Progressing   Problem: Elimination: Goal: Will not experience complications related to bowel motility Outcome: Progressing Goal: Will not experience complications related to urinary retention Outcome: Progressing   Problem: Pain Managment: Goal: General experience of comfort will improve Outcome: Progressing   Problem: Safety: Goal: Ability to remain free from injury will improve Outcome: Progressing   Problem: Skin Integrity: Goal: Risk for impaired skin integrity will decrease Outcome: Progressing

## 2022-02-22 NOTE — Consult Note (Signed)
CARDIOLOGY CONSULT NOTE               Patient ID: Kristin Mitchell MRN: [id number] DOB/AGE: 1940-07-07 81 y.o.  Admit date: 02/20/2022 Referring Physician [place] Primary Physician Thornton Dales, NP Primary Cardiologist  Reason for Consultation atrial fibrillation rapid ventricular response congestive heart failure shortness of breath  HPI: 81 year old female history of paroxysmal atrial fibrillation cardiomyopathy congestive heart failure hypertension presented with worsening shortness of breath over the last few days with dyspnea and lower extremity edema patient has had recurrently with presyncope lightheaded dizziness and required extra help by EMS to help her out.  She has had hypoxemia with sats in the 80s she was treated with diltiazem by EMS metoprolol Lasix in the emergency room advised to be admitted the patient states she feels better with less dyspnea  Review of systems complete and found to be negative unless listed above     Past Medical History:  Diagnosis Date   A-fib [facility])    Arthritis    Breast cancer ([place]) 11/26/2014   Cancer ([place])    breast   Intermittent palpitations    in early am    Past Surgical History:  Procedure Laterality Date   ABDOMINAL HYSTERECTOMY     CATARACT EXTRACTION W/PHACO Left 11/26/2014   Procedure: CATARACT EXTRACTION PHACO AND INTRAOCULAR LENS PLACEMENT ([place]);  Surgeon: Estill Cotta, MD;  Location: [facility] ORS;  Service: Ophthalmology;  Laterality: Left;  US:01:24 AP:26.6 CDE:40.16 PACk [id number]   CHOLECYSTECTOMY     EYE SURGERY     MASTECTOMY     bilateral    Medications Prior to Admission  Medication Sig Dispense Refill Last Dose   acetaminophen (TYLENOL) 500 MG tablet Take 500 mg by mouth every 6 (six) hours as needed.    Past Week   atorvastatin (LIPITOR) 80 MG tablet Take 1 tablet (80 mg total) by mouth daily. 30 tablet 0 02/19/2022 at NIGHT   Calcium Carb-Cholecalciferol (CALCIUM 600+D) 600-800  MG-UNIT TABS Take 1 tablet by mouth daily.   02/19/2022 at NIGHT   Cholecalciferol (VITAMIN D3) 50 MCG (2000 UT) TABS Take 1 tablet by mouth daily.   02/19/2022 at NIGHT   metoprolol succinate (TOPROL-XL) 50 MG 24 hr tablet Take 50 mg by mouth daily.   02/19/2022 at NIGHT   sodium chloride 1 g tablet Take 1 g by mouth 2 (two) times daily.   02/19/2022 at NIGHT   torsemide (DEMADEX) 5 MG tablet Take 5 mg by mouth daily.   02/15/2022   vitamin B-12 (CYANOCOBALAMIN) 1000 MCG tablet Take 1,000 mcg by mouth daily.   02/19/2022 at NIGHT   warfarin (COUMADIN) 5 MG tablet Take 5 mg by mouth daily at 4 PM.   02/19/2022 at NIGHT   alendronate (FOSAMAX) 70 MG tablet Take with a full glass of water on an empty stomach. (Patient taking differently: No sig reported) 12 tablet 0 02/16/2022 at AM   warfarin (COUMADIN) 2.5 MG tablet Take 1 tablet (2.5 mg total) by mouth daily. (Patient not taking: Reported on 02/20/2022) 30 tablet 0 Not Taking   Social History   Socioeconomic History   Marital status: Married    Spouse name: Not on file   Number of children: Not on file   Years of education: Not on file   Highest education level: Not on file  Occupational History   Not on file  Tobacco Use   Smoking status: Never   Smokeless tobacco: Never  Substance and Sexual  Activity   Alcohol use: No   Drug use: Never   Sexual activity: Not on file  Other Topics Concern   Not on file  Social History Narrative   Not on file   Social Determinants of Health   Financial Resource Strain: Not on file  Food Insecurity: No Food Insecurity (01/15/2022)   Hunger Vital Sign    Worried About Running Out of Food in the Last Year: Never true    Ran Out of Food in the Last Year: Never true  Transportation Needs: No Transportation Needs (01/15/2022)   PRAPARE - Hydrologist (Medical): No    Lack of Transportation (Non-Medical): No  Physical Activity: Not on file  Stress: Not on file  Social  Connections: Not on file  Intimate Partner Violence: Not At Risk (01/15/2022)   Humiliation, Afraid, Rape, and Kick questionnaire    Fear of Current or Ex-Partner: No    Emotionally Abused: No    Physically Abused: No    Sexually Abused: No    Family History  Problem Relation Age of Onset   Parkinson's disease Mother    Hodgkin's lymphoma Father    Prostate cancer Neg Hx    Bladder Cancer Neg Hx    Kidney cancer Neg Hx       Review of systems complete and found to be negative unless listed above      PHYSICAL EXAM  General: Well developed, well nourished, in no acute distress HEENT:  Normocephalic and atramatic Neck:  No JVD.  Lungs: Clear bilaterally to auscultation and percussion. Heart: Irregularly irregular. Normal S1 and S2 without gallops or murmurs.  Abdomen: Bowel sounds are positive, abdomen soft and non-tender  Msk:  Back normal, normal gait. Normal strength and tone for age. Extremities: No clubbing, cyanosis or 2+edema.   Neuro: Alert and oriented X 3. Psych:  Good affect, responds appropriately  Labs:   Lab Results  Component Value Date   WBC 5.4 02/20/2022   HGB 10.4 (L) 02/20/2022   HCT 33.4 (L) 02/20/2022   MCV 96.0 02/20/2022   PLT 151 02/20/2022    Recent Labs  Lab 02/20/22 0652 02/21/22 0403 02/22/22 0508  NA 136   < > 127*  K 3.1*   < > 3.8  CL 106   < > 94*  CO2 22   < > 24  BUN 16   < > 19  CREATININE 0.92   < > 1.05*  CALCIUM 8.6*   < > 8.3*  PROT 5.8*  --   --   BILITOT 1.1  --   --   ALKPHOS 107  --   --   ALT 44  --   --   AST 67*  --   --   GLUCOSE 117*   < > 86   < > = values in this interval not displayed.   No results found for: "CKTOTAL", "CKMB", "CKMBINDEX", "TROPONINI"  Lab Results  Component Value Date   CHOL 112 01/16/2022   CHOL 170 12/29/2021   Lab Results  Component Value Date   HDL 35 (L) 01/16/2022   HDL 52 12/29/2021   Lab Results  Component Value Date   LDLCALC 65 01/16/2022   LDLCALC 108 (H)  12/29/2021   Lab Results  Component Value Date   TRIG 58 01/16/2022   TRIG 48 12/29/2021   Lab Results  Component Value Date   CHOLHDL 3.2 01/16/2022   CHOLHDL 3.3 12/29/2021  No results found for: "LDLDIRECT"    Radiology: ECHOCARDIOGRAM COMPLETE  Result Date: 02/22/2022    ECHOCARDIOGRAM REPORT   Patient Name:   CHEREE FOWLES [facility] Date of Exam: 02/22/2022 Medical Rec #:  [id number]             Height:       62.0 in Accession #:    [id number]            Weight:       186.7 lb Date of Birth:  1940-08-18              BSA:          1.857 m Patient Age:    61 years              BP:           112/98 mmHg Patient Gender: F                     HR:           94 bpm. Exam Location:  [facility] Procedure: 2D Echo Indications:     CHF I50.31  History:         Patient has prior history of Echocardiogram examinations, most                  recent 12/29/2021.  Sonographer:     Kathlen Brunswick RDCS Referring Phys:  [place] [facility] Diagnosing Phys: Yolonda Kida MD IMPRESSIONS  1. Left ventricular ejection fraction, by estimation, is 35 to 40%. The left ventricle has moderately decreased function. The left ventricle demonstrates global hypokinesis. Left ventricular diastolic parameters are consistent with Grade I diastolic dysfunction (impaired relaxation).  2. Right ventricular systolic function is normal. The right ventricular size is normal.  3. The mitral valve is normal in structure. Moderate to severe mitral valve regurgitation.  4. Tricuspid valve regurgitation is moderate.  5. The aortic valve is normal in structure. Aortic valve regurgitation is not visualized. FINDINGS  Left Ventricle: Left ventricular ejection fraction, by estimation, is 35 to 40%. The left ventricle has moderately decreased function. The left ventricle demonstrates global hypokinesis. The left ventricular internal cavity size was normal in size. There is no left ventricular hypertrophy. Left ventricular diastolic parameters  are consistent with Grade I diastolic dysfunction (impaired relaxation). Right Ventricle: The right ventricular size is normal. No increase in right ventricular wall thickness. Right ventricular systolic function is normal. Left Atrium: Left atrial size was normal in size. Right Atrium: Right atrial size was normal in size. Pericardium: There is no evidence of pericardial effusion. Mitral Valve: The mitral valve is normal in structure. Moderate to severe mitral valve regurgitation. Tricuspid Valve: The tricuspid valve is normal in structure. Tricuspid valve regurgitation is moderate. Aortic Valve: The aortic valve is normal in structure. Aortic valve regurgitation is not visualized. Aortic valve peak gradient measures 7.6 mmHg. Pulmonic Valve: The pulmonic valve was normal in structure. Pulmonic valve regurgitation is mild. Aorta: The ascending aorta was not well visualized. IAS/Shunts: No atrial level shunt detected by color flow Doppler.  LEFT VENTRICLE PLAX 2D LVIDd:         4.60 cm     Diastology LVIDs:         3.50 cm     LV e' medial:    7.62 cm/s LV PW:         1.10 cm     LV E/e' medial:  15.5 LV  IVS:        1.30 cm     LV e' lateral:   12.75 cm/s LVOT diam:     1.80 cm     LV E/e' lateral: 9.3 LV SV:         31 LV SV Index:   17 LVOT Area:     2.54 cm  LV Volumes (MOD) LV vol d, MOD A2C: 52.7 ml LV vol d, MOD A4C: 51.2 ml LV vol s, MOD A2C: 32.3 ml LV vol s, MOD A4C: 28.6 ml LV SV MOD A2C:     20.4 ml LV SV MOD A4C:     51.2 ml LV SV MOD BP:      21.4 ml RIGHT VENTRICLE RV Basal diam:  2.80 cm RV S prime:     7.24 cm/s TAPSE (M-mode): 1.6 cm LEFT ATRIUM           Index        RIGHT ATRIUM           Index LA diam:      4.30 cm 2.32 cm/m   RA Area:     14.90 cm LA Vol (A4C): 49.6 ml 26.71 ml/m  RA Volume:   34.90 ml  18.80 ml/m  AORTIC VALVE                 PULMONIC VALVE AV Area (Vmax): 1.23 cm     PV Vmax:          1.09 m/s AV Vmax:        138.25 cm/s  PV Peak grad:     4.8 mmHg AV Peak Grad:   7.6  mmHg     PR End Diast Vel: 7.84 msec LVOT Vmax:      66.67 cm/s LVOT Vmean:     44.000 cm/s LVOT VTI:       0.121 m  AORTA Ao Root diam: 3.10 cm Ao Asc diam:  2.80 cm MITRAL VALVE                  TRICUSPID VALVE MV Area (PHT): 3.63 cm       TV Peak grad:   27.8 mmHg MV Decel Time: 209 msec       TV Vmax:        2.64 m/s MR Peak grad:    106.1 mmHg MR Mean grad:    62.0 mmHg    SHUNTS MR Vmax:         515.00 cm/s  Systemic VTI:  0.12 m MR Vmean:        365.0 cm/s   Systemic Diam: 1.80 cm MR PISA:         1.01 cm MR PISA Eff ROA: 6 mm MR PISA Radius:  0.40 cm MV E velocity: 118.00 cm/s Yolonda Kida MD Electronically signed by Yolonda Kida MD Signature Date/Time: 02/22/2022/9:53:14 AM    Final    DG Chest 2 View  Result Date: 02/20/2022 CLINICAL DATA:  Palpitations and shortness of breath.  Tachycardia. EXAM: CHEST - 2 VIEW COMPARISON:  One-view chest x-ray 01/15/2022 FINDINGS: The heart is enlarged, exaggerated by low lung volumes. Right perihilar and left basilar airspace opacities are present. Left greater than right pleural effusions are present. Mild interstitial edema is noted. Surgical clips are present in the axillary regions bilaterally. Remote right acromial fracture noted. IMPRESSION: 1. Cardiomegaly with mild interstitial edema and bilateral pleural effusions compatible with congestive heart failure. 2. Right perihilar  and left basilar airspace disease likely reflects atelectasis. Electronically Signed   By: San Morelle M.D.   On: 02/20/2022 07:08   US Abdomen Limited RUQ (LIVER/GB)  Result Date: 02/12/2022 CLINICAL DATA:  Abnormal LFTs.  Prior cholecystectomy. EXAM: ULTRASOUND ABDOMEN LIMITED RIGHT UPPER QUADRANT COMPARISON:  Ultrasound abdomen January 15, 2022; MRI abdomen December 19, 2021 FINDINGS: Gallbladder: Surgically absent Common bile duct: Diameter: 4 mm Liver: No focal lesion identified. Within normal limits in parenchymal echogenicity. Portal vein is patent on  color Doppler imaging with normal direction of blood flow towards the liver. Other: Redemonstrated 3.7 x 3.1 x 3.6 cm exophytic mass off the superior pole of the right kidney, previously measuring 3.7 x 3.6 x 2.7 cm. Right pleural effusion. IMPRESSION: 1. Redemonstrated exophytic mass off the superior pole of the right kidney. This was better evaluated on MRI abdomen December 20, 2021. Recommend follow-up as per report on that examination (MRI abdomen with and without contrast 6 months from prior MR abdomen). 2. Prior cholecystectomy. 3. Right pleural effusion. Electronically Signed   By: Lovey Newcomer M.D.   On: 02/12/2022 10:03    EKG: Atrial fibrillation rate around 80 nonspecific ST-T wave changes  ASSESSMENT AND PLAN:  Atrial fibrillation rapid ventricular response we will advance metoprolol therapy to help with rate control continue warfarin for anticoagulation Discontinue diltiazem for rate management because of cardiomyopathy and relative hypotension switch to metoprolol Consider adding amiodarone therapy for rhythm management and control Blood pressure management with low-dose hydralazine 12.5 every 8 hours in addition to metoprolol.  Patient currently a poor candidate for ARB ACE or Bertell Maria because of renal insufficiency.  Once renal function improves will consider starting low-dose losartan Cardiomyopathy ejection fraction between 35 and 40% recommend aggressive heart failure cardiomyopathy therapy losartan beta-blockers diuretics consider adding spironolactone Moderate to severe mitral regurgitation recommend diuretic therapy as well as ACE ARB continue hydralazine in the interim Correct all electrolytes including hyponatremia hypokalemia Do not necessarily recommend invasive procedure at this point Functional study can be accomplished as an outpatient   Signed: Yolonda Kida MD,  02/22/2022, 10:17 AM

## 2022-02-22 NOTE — TOC Progression Note (Signed)
Transition of Care [facility]) - Progression Note    Patient Details  Name: Kristin Mitchell MRN: [id number] Date of Birth: [birth year]  [facility] CM/SW Contact  Izola Price, RN Phone Number: 02/22/2022, 12:54 PM  Clinical Narrative:   10/15: Spoke with patient regarding TOC role/discharge plan of HH. Patient immediately declined any HH services. Informed that if changes her mind to contact her PCP post discharge from acute care. Patient was not interested in further conversation at this time. Simmie Davies RN CM          Expected Discharge Plan and Services                                                 Social Determinants of Health (SDOH) Interventions    Readmission Risk Interventions     No data to display

## 2022-02-22 NOTE — Progress Notes (Signed)
PROGRESS NOTE    Kristin Mitchell  ZYS:[id number] DOB: 1941/03/19 DOA: 02/20/2022 PCP: Latanya Maudlin, NP  Outpatient Specialists: cardiology    Brief Narrative:   From admission h and p Admitted last month for a fib with rvr, uti.   Couple of weeks ago visited PCP complaining of leg swelling and some shortness of breath. Reports that for the last few days has had worsening fatigue and shortness of breath, worse with activity or recumbence. No cough or fever or chest pain. No vomiting or diarrhea. PCP started torsemide. Reports legs remain swollen. Also with palpitations and presyncope. Because of worsening SOB husband alerted EMS. Reportedly hypoxic for them and here per EDP 88% on room air. Found to be in a fib with rvr via ems. Given 10 of dilt per EMS. In our ED also given IV metop and lasix. Currently reports feeling somewhat improved   Assessment & Plan:   Principal Problem:   Atrial fibrillation with rapid ventricular response (HCC) Active Problems:   Chronic diastolic CHF (congestive heart failure) (HCC)   Essential hypertension   Breast cancer of upper-outer quadrant of right female breast ([place])   # A-fib with RVR Hemodynamically stable. Received dilt IV with EMS and IV and then oral metop here. On metop succinate 50 qd at home. Metoprolol up-titrated but given chf and lack of control with metop have switched to diltiazem. Hospitalized last month for this. However is borderline hypotensive with dilt and new systolic dysfunction on TTE this morning - continue dilt bolus for now - lasix paused given robust response first 24 hours, up-trending creatinine, improvement in respiratory symptoms - continue tele - coumadin per pharmacy, daily inr - PT advises HH PT   # HFrEF with exacerbation Exacerbation likely mediated by a fib. Pulm edema, hypoxia, and elevated bnp. No chest pain and trop wnl, doubt ischemia. Robust response to lasix  is out over 6 liters. TTE this  morning now with reduced ef of 35-40 (normal 2 months ago), grade 1 dd, moderate to severe MR, mod TR - holding lasix for now as above - cardiology to see   # Acute hypoxic respiratory failure 2/2 pulm edema from a fib and chf. Normal o2 and breathing comfortably on 2 L on admission. Per EDP was 88% on room air in the ED. This morning mid-90s on room air - monitor   # Hypokalemia # Hypomagnesemia Resolved with repletion - monitor  # Hyponatremia Likely 2/2 diuresis, na is 127 - will repeat this afternoon   # HTN Here bp wnl  DVT prophylaxis: home coumadin Code Status: dnr Family Communication: husband updated telephonically 10/15  Level of care: Progressive Status is: Inpatient Remains inpatient appropriate because: need for ongoing inpt w/u   Consultants:  cardiology  Procedures: none  Antimicrobials:  none    Subjective: Reports breathing much improved. Tolerating diet, no chest pain  Objective: Vitals:   02/22/22 0000 02/22/22 0115 02/22/22 0400 02/22/22 0807  BP: (!) 101/55  (!) 112/98 (!) 96/56  Pulse: 100  (!) 105 94  Resp: '18 16 16 18  '$ Temp: 99.1 F (37.3 C)  98.3 F (36.8 C) 98.2 F (36.8 C)  TempSrc: Axillary  Oral   SpO2: 94%  99% 92%  Weight:  84.7 kg    Height:        Intake/Output Summary (Last 24 hours) at 02/22/2022 1023 Last data filed at 02/22/2022 0100 Gross per 24 hour  Intake 151.94 ml  Output 1850 ml  Net -1698.06 ml   Filed Weights   02/20/22 0639 02/21/22 1854 02/22/22 0115  Weight: 88 kg 87.2 kg 84.7 kg    Examination:  Constitutional: No acute distress Head: Atraumatic Eyes: Conjunctiva clear ENM: Moist mucous membranes. Normal dentition.  Respiratory: rales at bases Cardiovascular: tachycardic, irreg irreg Abdomen: Non-tender, non-distended. No masses. No rebound or guarding. Positive bowel sounds. Musculoskeletal: No joint deformity upper and lower extremities. Normal ROM, no contractures. Normal muscle tone.   Skin: No rashes, lesions, or ulcers.  Extremities: mod peripheral edema Neurologic: Alert, moving all 4 extremities. Psychiatric: Normal insight and judgement.    Data Reviewed: I have personally reviewed following labs and imaging studies  CBC: Recent Labs  Lab 02/20/22 0652  WBC 5.4  NEUTROABS 4.0  HGB 10.4*  HCT 33.4*  MCV 96.0  PLT 546   Basic Metabolic Panel: Recent Labs  Lab 02/20/22 0652 02/20/22 0850 02/21/22 0403 02/21/22 0412 02/22/22 0508  NA 136  --  133*  --  127*  K 3.1*  --  3.7  --  3.8  CL 106  --  100  --  94*  CO2 22  --  25  --  24  GLUCOSE 117*  --  94  --  86  BUN 16  --  21  --  19  CREATININE 0.92  --  1.06*  --  1.05*  CALCIUM 8.6*  --  9.0  --  8.3*  MG  --  1.4*  --  1.8  --    GFR: Estimated Creatinine Clearance: 42.4 mL/min (A) (by C-G formula based on SCr of 1.05 mg/dL (H)). Liver Function Tests: Recent Labs  Lab 02/20/22 0652  AST 67*  ALT 44  ALKPHOS 107  BILITOT 1.1  PROT 5.8*  ALBUMIN 3.0*   No results for input(s): "LIPASE", "AMYLASE" in the last 168 hours. No results for input(s): "AMMONIA" in the last 168 hours. Coagulation Profile: Recent Labs  Lab 02/20/22 0652 02/21/22 0403 02/22/22 0508  INR 2.5* 2.7* 3.1*   Cardiac Enzymes: No results for input(s): "CKTOTAL", "CKMB", "CKMBINDEX", "TROPONINI" in the last 168 hours. BNP (last 3 results) No results for input(s): "PROBNP" in the last 8760 hours. HbA1C: No results for input(s): "HGBA1C" in the last 72 hours. CBG: No results for input(s): "GLUCAP" in the last 168 hours. Lipid Profile: No results for input(s): "CHOL", "HDL", "LDLCALC", "TRIG", "CHOLHDL", "LDLDIRECT" in the last 72 hours. Thyroid Function Tests: Recent Labs    02/20/22 1127  TSH 1.138   Anemia Panel: No results for input(s): "VITAMINB12", "FOLATE", "FERRITIN", "TIBC", "IRON", "RETICCTPCT" in the last 72 hours. Urine analysis:    Component Value Date/Time   COLORURINE YELLOW (A)  01/15/2022 0518   APPEARANCEUR HAZY (A) 01/15/2022 0518   LABSPEC 1.003 (L) 01/15/2022 0518   PHURINE 6.0 01/15/2022 0518   GLUCOSEU NEGATIVE 01/15/2022 0518   HGBUR SMALL (A) 01/15/2022 0518   BILIRUBINUR NEGATIVE 01/15/2022 0518   KETONESUR NEGATIVE 01/15/2022 0518   PROTEINUR NEGATIVE 01/15/2022 0518   NITRITE NEGATIVE 01/15/2022 0518   LEUKOCYTESUR MODERATE (A) 01/15/2022 0518   Sepsis Labs: '@LABRCNTIP'$ (procalcitonin:4,lacticidven:4)  )No results found for this or any previous visit (from the past 240 hour(s)).       Radiology Studies: ECHOCARDIOGRAM COMPLETE  Result Date: 02/22/2022    ECHOCARDIOGRAM REPORT   Patient Name:   Kristin Mitchell Date of Exam: 02/22/2022 Medical Rec #:  [id number]  Height:       62.0 in Accession #:    [id number]            Weight:       186.7 lb Date of Birth:  Jan 27, 1941              BSA:          1.857 m Patient Age:    42 years              BP:           112/98 mmHg Patient Gender: F                     HR:           94 bpm. Exam Location:  [facility] Procedure: 2D Echo Indications:     CHF I50.31  History:         Patient has prior history of Echocardiogram examinations, most                  recent 12/29/2021.  Sonographer:     Kathlen Brunswick RDCS Referring Phys:  [place] ZHGD Diagnosing Phys: Yolonda Kida MD IMPRESSIONS  1. Left ventricular ejection fraction, by estimation, is 35 to 40%. The left ventricle has moderately decreased function. The left ventricle demonstrates global hypokinesis. Left ventricular diastolic parameters are consistent with Grade I diastolic dysfunction (impaired relaxation).  2. Right ventricular systolic function is normal. The right ventricular size is normal.  3. The mitral valve is normal in structure. Moderate to severe mitral valve regurgitation.  4. Tricuspid valve regurgitation is moderate.  5. The aortic valve is normal in structure. Aortic valve regurgitation is not visualized. FINDINGS   Left Ventricle: Left ventricular ejection fraction, by estimation, is 35 to 40%. The left ventricle has moderately decreased function. The left ventricle demonstrates global hypokinesis. The left ventricular internal cavity size was normal in size. There is no left ventricular hypertrophy. Left ventricular diastolic parameters are consistent with Grade I diastolic dysfunction (impaired relaxation). Right Ventricle: The right ventricular size is normal. No increase in right ventricular wall thickness. Right ventricular systolic function is normal. Left Atrium: Left atrial size was normal in size. Right Atrium: Right atrial size was normal in size. Pericardium: There is no evidence of pericardial effusion. Mitral Valve: The mitral valve is normal in structure. Moderate to severe mitral valve regurgitation. Tricuspid Valve: The tricuspid valve is normal in structure. Tricuspid valve regurgitation is moderate. Aortic Valve: The aortic valve is normal in structure. Aortic valve regurgitation is not visualized. Aortic valve peak gradient measures 7.6 mmHg. Pulmonic Valve: The pulmonic valve was normal in structure. Pulmonic valve regurgitation is mild. Aorta: The ascending aorta was not well visualized. IAS/Shunts: No atrial level shunt detected by color flow Doppler.  LEFT VENTRICLE PLAX 2D LVIDd:         4.60 cm     Diastology LVIDs:         3.50 cm     LV e' medial:    7.62 cm/s LV PW:         1.10 cm     LV E/e' medial:  15.5 LV IVS:        1.30 cm     LV e' lateral:   12.75 cm/s LVOT diam:     1.80 cm     LV E/e' lateral: 9.3 LV SV:         31 LV SV  Index:   17 LVOT Area:     2.54 cm  LV Volumes (MOD) LV vol d, MOD A2C: 52.7 ml LV vol d, MOD A4C: 51.2 ml LV vol s, MOD A2C: 32.3 ml LV vol s, MOD A4C: 28.6 ml LV SV MOD A2C:     20.4 ml LV SV MOD A4C:     51.2 ml LV SV MOD BP:      21.4 ml RIGHT VENTRICLE RV Basal diam:  2.80 cm RV S prime:     7.24 cm/s TAPSE (M-mode): 1.6 cm LEFT ATRIUM           Index        RIGHT  ATRIUM           Index LA diam:      4.30 cm 2.32 cm/m   RA Area:     14.90 cm LA Vol (A4C): 49.6 ml 26.71 ml/m  RA Volume:   34.90 ml  18.80 ml/m  AORTIC VALVE                 PULMONIC VALVE AV Area (Vmax): 1.23 cm     PV Vmax:          1.09 m/s AV Vmax:        138.25 cm/s  PV Peak grad:     4.8 mmHg AV Peak Grad:   7.6 mmHg     PR End Diast Vel: 7.84 msec LVOT Vmax:      66.67 cm/s LVOT Vmean:     44.000 cm/s LVOT VTI:       0.121 m  AORTA Ao Root diam: 3.10 cm Ao Asc diam:  2.80 cm MITRAL VALVE                  TRICUSPID VALVE MV Area (PHT): 3.63 cm       TV Peak grad:   27.8 mmHg MV Decel Time: 209 msec       TV Vmax:        2.64 m/s MR Peak grad:    106.1 mmHg MR Mean grad:    62.0 mmHg    SHUNTS MR Vmax:         515.00 cm/s  Systemic VTI:  0.12 m MR Vmean:        365.0 cm/s   Systemic Diam: 1.80 cm MR PISA:         1.01 cm MR PISA Eff ROA: 6 mm MR PISA Radius:  0.40 cm MV E velocity: 118.00 cm/s Dwayne D Callwood MD Electronically signed by Yolonda Kida MD Signature Date/Time: 02/22/2022/9:53:14 AM    Final         Scheduled Meds:  atorvastatin  80 mg Oral QHS   sodium chloride flush  3 mL Intravenous Q12H   Warfarin - Pharmacist Dosing Inpatient   Does not apply q1600   Continuous Infusions:  sodium chloride     diltiazem (CARDIZEM) infusion 5 mg/hr (02/22/22 0100)     LOS: 2 days     Desma Maxim, MD [facility]   If 7PM-7AM, please contact night-coverage [email or web address] Password [email or web address] 02/22/2022, 10:23 AM

## 2022-02-22 NOTE — Progress Notes (Signed)
*  PRELIMINARY RESULTS* Echocardiogram 2D Echocardiogram has been performed.  Kristin Mitchell 02/22/2022, 9:36 AM

## 2022-02-23 DIAGNOSIS — I4891 Unspecified atrial fibrillation: Secondary | ICD-10-CM | POA: Diagnosis not present

## 2022-02-23 LAB — PROTIME-INR
INR: 2.9 — ABNORMAL HIGH (ref 0.8–1.2)
Prothrombin Time: 29.9 seconds — ABNORMAL HIGH (ref 11.4–15.2)

## 2022-02-23 LAB — BASIC METABOLIC PANEL
Anion gap: 7 (ref 5–15)
BUN: 22 mg/dL (ref 8–23)
CO2: 24 mmol/L (ref 22–32)
Calcium: 8.6 mg/dL — ABNORMAL LOW (ref 8.9–10.3)
Chloride: 98 mmol/L (ref 98–111)
Creatinine, Ser: 1.03 mg/dL — ABNORMAL HIGH (ref 0.44–1.00)
GFR, Estimated: 55 mL/min — ABNORMAL LOW (ref >60)
Glucose, Bld: 86 mg/dL (ref 70–99)
Potassium: 3.7 mmol/L (ref 3.5–5.1)
Sodium: 129 mmol/L — ABNORMAL LOW (ref 135–145)

## 2022-02-23 LAB — CORTISOL-AM, BLOOD: Cortisol - AM: 15 ug/dL (ref 6.7–22.6)

## 2022-02-23 MED ORDER — POTASSIUM CHLORIDE CRYS ER 20 MEQ PO TBCR
40.0000 meq | EXTENDED_RELEASE_TABLET | Freq: Once | ORAL | Status: AC
  Administered 2022-02-23: 40 meq via ORAL
  Filled 2022-02-23: qty 2

## 2022-02-23 MED ORDER — WARFARIN SODIUM 2 MG PO TABS: 2.0000 mg | ORAL_TABLET | Freq: Once | ORAL | Status: DC

## 2022-02-23 MED ORDER — METOPROLOL TARTRATE 50 MG PO TABS
50.0000 mg | ORAL_TABLET | Freq: Two times a day (BID) | ORAL | Status: DC
  Administered 2022-02-23 (×2): 50 mg via ORAL
  Filled 2022-02-23 (×3): qty 1

## 2022-02-23 NOTE — Progress Notes (Signed)
[facility] Encounter Note  Patient: Kristin Mitchell / Admit Date: 02/20/2022 / Date of Encounter: 02/23/2022, 8:48 AM   Subjective: Patient overall feels better today than yesterday and is ambulating slightly with no evidence of significant cardiac symptoms.  The patient has had atrial fibrillation with rapid ventricular rate now slightly better controlled with metoprolol and will need increased doses of metoprolol for better heart rate control.  In addition to that the patient has been on warfarin for which she currently is appropriate for further risk reduction of cardiovascular event.  Echocardiogram has shown LV systolic dysfunction which may be due to above and may resolve with better heart rate control and management of atrial fibrillation.  There some heart failure symptoms with pulmonary edema on admission now resolved.  Review of Systems: Positive for: Shortness of breath Negative for: Vision change, hearing change, syncope, dizziness, nausea, vomiting,diarrhea, bloody stool, stomach pain, cough, congestion, diaphoresis, urinary frequency, urinary pain,skin lesions, skin rashes Others previously listed  Objective: Telemetry: Atrial fibrillation with controlled ventricular rate Physical Exam: Blood pressure 120/78, pulse (!) 108, temperature 97.8 F (36.6 C), resp. rate 18, height '5\' 2"'$  (1.575 m), weight 84.7 kg, SpO2 95 %. Body mass index is 34.15 kg/m. General: Well developed, well nourished, in no acute distress. Head: Normocephalic, atraumatic, sclera non-icteric, no xanthomas, nares are without discharge. Neck: No apparent masses Lungs: Normal respirations with few wheezes, no rhonchi, no rales , no crackles   Heart: Irregular rate and rhythm, normal S1 S2, no murmur, no rub, no gallop, PMI is normal size and placement, carotid upstroke normal without bruit, jugular venous pressure normal Abdomen: Soft, non-tender, non-distended with normoactive  bowel sounds. No hepatosplenomegaly. Abdominal aorta is normal size without bruit Extremities: No edema, no clubbing, no cyanosis, no ulcers,  Peripheral: 2+ radial, 2+ femoral, 2+ dorsal pedal pulses Neuro: Alert and oriented. Moves all extremities spontaneously. Psych:  Responds to questions appropriately with a normal affect.   Intake/Output Summary (Last 24 hours) at 02/23/2022 0848 Last data filed at 02/23/2022 0451 Gross per 24 hour  Intake 62.86 ml  Output 1900 ml  Net -1837.14 ml    Inpatient Medications:   atorvastatin  80 mg Oral QHS   hydrALAZINE  12.5 mg Oral Q8H   metoprolol tartrate  50 mg Oral BID   midodrine  2.5 mg Oral TID WC   sodium chloride flush  3 mL Intravenous Q12H   Warfarin - Pharmacist Dosing Inpatient   Does not apply q1600   Infusions:   sodium chloride      Labs: Recent Labs    02/20/22 0850 02/21/22 0403 02/21/22 0412 02/22/22 0508 02/22/22 1359 02/23/22 0538  NA  --    < >  --    < > 124* 129*  K  --    < >  --    < > 3.7 3.7  CL  --    < >  --    < > 94* 98  CO2  --    < >  --    < > 24 24  GLUCOSE  --    < >  --    < > 117* 86  BUN  --    < >  --    < > 22 22  CREATININE  --    < >  --    < > 1.18* 1.03*  CALCIUM  --    < >  --    < >  8.1* 8.6*  MG 1.4*  --  1.8  --   --   --    < > = values in this interval not displayed.   No results for input(s): "AST", "ALT", "ALKPHOS", "BILITOT", "PROT", "ALBUMIN" in the last 72 hours. No results for input(s): "WBC", "NEUTROABS", "HGB", "HCT", "MCV", "PLT" in the last 72 hours. No results for input(s): "CKTOTAL", "CKMB", "TROPONINI" in the last 72 hours. Invalid input(s): "POCBNP" No results for input(s): "HGBA1C" in the last 72 hours.   Weights: Filed Weights   02/20/22 0639 02/21/22 1854 02/22/22 0115  Weight: 88 kg 87.2 kg 84.7 kg     Radiology/Studies:  ECHOCARDIOGRAM COMPLETE  Result Date: 02/22/2022    ECHOCARDIOGRAM REPORT   Patient Name:   Kristin Mitchell [facility] Date of  Exam: 02/22/2022 Medical Rec #:  [id number]             Height:       62.0 in Accession #:    [id number]            Weight:       186.7 lb Date of Birth:  Oct 01, 1940              BSA:          1.857 m Patient Age:    81 years              BP:           112/98 mmHg Patient Gender: F                     HR:           94 bpm. Exam Location:  [facility] Procedure: 2D Echo Indications:     CHF I50.31  History:         Patient has prior history of Echocardiogram examinations, most                  recent 12/29/2021.  Sonographer:     Kathlen Brunswick RDCS Referring Phys:  [place] WPYK Diagnosing Phys: Yolonda Kida MD IMPRESSIONS  1. Left ventricular ejection fraction, by estimation, is 35 to 40%. The left ventricle has moderately decreased function. The left ventricle demonstrates global hypokinesis. Left ventricular diastolic parameters are consistent with Grade I diastolic dysfunction (impaired relaxation).  2. Right ventricular systolic function is normal. The right ventricular size is normal.  3. The mitral valve is normal in structure. Moderate to severe mitral valve regurgitation.  4. Tricuspid valve regurgitation is moderate.  5. The aortic valve is normal in structure. Aortic valve regurgitation is not visualized. FINDINGS  Left Ventricle: Left ventricular ejection fraction, by estimation, is 35 to 40%. The left ventricle has moderately decreased function. The left ventricle demonstrates global hypokinesis. The left ventricular internal cavity size was normal in size. There is no left ventricular hypertrophy. Left ventricular diastolic parameters are consistent with Grade I diastolic dysfunction (impaired relaxation). Right Ventricle: The right ventricular size is normal. No increase in right ventricular wall thickness. Right ventricular systolic function is normal. Left Atrium: Left atrial size was normal in size. Right Atrium: Right atrial size was normal in size. Pericardium: There is no evidence of  pericardial effusion. Mitral Valve: The mitral valve is normal in structure. Moderate to severe mitral valve regurgitation. Tricuspid Valve: The tricuspid valve is normal in structure. Tricuspid valve regurgitation is moderate. Aortic Valve: The aortic valve is normal in structure. Aortic valve regurgitation is not visualized.  Aortic valve peak gradient measures 7.6 mmHg. Pulmonic Valve: The pulmonic valve was normal in structure. Pulmonic valve regurgitation is mild. Aorta: The ascending aorta was not well visualized. IAS/Shunts: No atrial level shunt detected by color flow Doppler.  LEFT VENTRICLE PLAX 2D LVIDd:         4.60 cm     Diastology LVIDs:         3.50 cm     LV e' medial:    7.62 cm/s LV PW:         1.10 cm     LV E/e' medial:  15.5 LV IVS:        1.30 cm     LV e' lateral:   12.75 cm/s LVOT diam:     1.80 cm     LV E/e' lateral: 9.3 LV SV:         31 LV SV Index:   17 LVOT Area:     2.54 cm  LV Volumes (MOD) LV vol d, MOD A2C: 52.7 ml LV vol d, MOD A4C: 51.2 ml LV vol s, MOD A2C: 32.3 ml LV vol s, MOD A4C: 28.6 ml LV SV MOD A2C:     20.4 ml LV SV MOD A4C:     51.2 ml LV SV MOD BP:      21.4 ml RIGHT VENTRICLE RV Basal diam:  2.80 cm RV S prime:     7.24 cm/s TAPSE (M-mode): 1.6 cm LEFT ATRIUM           Index        RIGHT ATRIUM           Index LA diam:      4.30 cm 2.32 cm/m   RA Area:     14.90 cm LA Vol (A4C): 49.6 ml 26.71 ml/m  RA Volume:   34.90 ml  18.80 ml/m  AORTIC VALVE                 PULMONIC VALVE AV Area (Vmax): 1.23 cm     PV Vmax:          1.09 m/s AV Vmax:        138.25 cm/s  PV Peak grad:     4.8 mmHg AV Peak Grad:   7.6 mmHg     PR End Diast Vel: 7.84 msec LVOT Vmax:      66.67 cm/s LVOT Vmean:     44.000 cm/s LVOT VTI:       0.121 m  AORTA Ao Root diam: 3.10 cm Ao Asc diam:  2.80 cm MITRAL VALVE                  TRICUSPID VALVE MV Area (PHT): 3.63 cm       TV Peak grad:   27.8 mmHg MV Decel Time: 209 msec       TV Vmax:        2.64 m/s MR Peak grad:    106.1 mmHg MR Mean grad:     62.0 mmHg    SHUNTS MR Vmax:         515.00 cm/s  Systemic VTI:  0.12 m MR Vmean:        365.0 cm/s   Systemic Diam: 1.80 cm MR PISA:         1.01 cm MR PISA Eff ROA: 6 mm MR PISA Radius:  0.40 cm MV E velocity: 118.00 cm/s Yolonda Kida MD Electronically signed by Yolonda Kida MD  Signature Date/Time: 02/22/2022/9:53:14 AM    Final    DG Chest 2 View  Result Date: 02/20/2022 CLINICAL DATA:  Palpitations and shortness of breath.  Tachycardia. EXAM: CHEST - 2 VIEW COMPARISON:  One-view chest x-ray 01/15/2022 FINDINGS: The heart is enlarged, exaggerated by low lung volumes. Right perihilar and left basilar airspace opacities are present. Left greater than right pleural effusions are present. Mild interstitial edema is noted. Surgical clips are present in the axillary regions bilaterally. Remote right acromial fracture noted. IMPRESSION: 1. Cardiomegaly with mild interstitial edema and bilateral pleural effusions compatible with congestive heart failure. 2. Right perihilar and left basilar airspace disease likely reflects atelectasis. Electronically Signed   By: San Morelle M.D.   On: 02/20/2022 07:08   US Abdomen Limited RUQ (LIVER/GB)  Result Date: 02/12/2022 CLINICAL DATA:  Abnormal LFTs.  Prior cholecystectomy. EXAM: ULTRASOUND ABDOMEN LIMITED RIGHT UPPER QUADRANT COMPARISON:  Ultrasound abdomen January 15, 2022; MRI abdomen December 19, 2021 FINDINGS: Gallbladder: Surgically absent Common bile duct: Diameter: 4 mm Liver: No focal lesion identified. Within normal limits in parenchymal echogenicity. Portal vein is patent on color Doppler imaging with normal direction of blood flow towards the liver. Other: Redemonstrated 3.7 x 3.1 x 3.6 cm exophytic mass off the superior pole of the right kidney, previously measuring 3.7 x 3.6 x 2.7 cm. Right pleural effusion. IMPRESSION: 1. Redemonstrated exophytic mass off the superior pole of the right kidney. This was better evaluated on MRI abdomen  December 20, 2021. Recommend follow-up as per report on that examination (MRI abdomen with and without contrast 6 months from prior MR abdomen). 2. Prior cholecystectomy. 3. Right pleural effusion. Electronically Signed   By: Lovey Newcomer M.D.   On: 02/12/2022 10:03     Assessment and Recommendation  81 y.o. female with acute systolic dysfunction congestive heart failure likely secondary to atrial fibrillation with rapid ventricular rate now more controlled on metoprolol without evidence of myocardial infarction 1.  Increase metoprolol to 50 mg twice per day for better heart rate control with a heart rate goal below 100 bpm with ambulation 2.  Avoid diltiazem at this point due to concerns of LV dysfunction 3.  If more hypotension would discontinue hydralazine if necessary and will consider the possibility of ACE inhibitor or an angiotensin receptor blocker instead if glomerular filtration rate stable 4.  Continue anticoagulation for further risk reduction in stroke with atrial fibrillation with warfarin with a goal INR between 2 and 3 5.  If patient ambulating well with no further significant symptoms on current medical regimen would consider possible discharged home with further adjustments of medication management after there  Signed, Serafina Royals M.D. FACC

## 2022-02-23 NOTE — Progress Notes (Signed)
PROGRESS NOTE    Kristin Mitchell  CXK:[id number] DOB: 02/20/1941 DOA: 02/20/2022 PCP: Latanya Maudlin, NP  Outpatient Specialists: cardiology    Brief Narrative:   From admission h and p Admitted last month for a fib with rvr, uti.   Couple of weeks ago visited PCP complaining of leg swelling and some shortness of breath. Reports that for the last few days has had worsening fatigue and shortness of breath, worse with activity or recumbence. No cough or fever or chest pain. No vomiting or diarrhea. PCP started torsemide. Reports legs remain swollen. Also with palpitations and presyncope. Because of worsening SOB husband alerted EMS. Reportedly hypoxic for them and here per EDP 88% on room air. Found to be in a fib with rvr via ems. Given 10 of dilt per EMS. In our ED also given IV metop and lasix. Currently reports feeling somewhat improved   Assessment & Plan:   Principal Problem:   Atrial fibrillation with rapid ventricular response (HCC) Active Problems:   Chronic diastolic CHF (congestive heart failure) (HCC)   Essential hypertension   Breast cancer of upper-outer quadrant of right female breast ([place])   # A-fib with RVR Hemodynamically stable. Received dilt IV with EMS and IV and then oral metop here. On metop succinate 50 qd at home. Metoprolol up-titrated to 50 bid without resolution of RVR. Dilt gtt started but patient hypotensive with that and TTE showed new systolic dysfunction so that was stopped. Cardiology brought on board 10/15.  - lasix paused given robust response first 24 hours, up-trending creatinine, improvement in respiratory symptoms - metoprolol re-instituted by cardiology today, will plan on up-titrating - continue tele - coumadin per pharmacy, daily inr - PT advises [facility] PT   # HFrEF with exacerbation Exacerbation likely mediated by a fib. Pulm edema, hypoxia, and elevated bnp. No chest pain and trop wnl, doubt ischemia. Robust response to lasix  is out  over 6 liters. TTE this morning now with reduced ef of 35-40 (normal 2 months ago), grade 1 dd, moderate to severe MR, mod TR - holding lasix for now as above - cardiology advises getting HR under control here, further w/u and therapeutics as outpt   # Acute hypoxic respiratory failure 2/2 pulm edema from a fib and chf. Normal o2 and breathing comfortably on 2 L on admission. Per EDP was 88% on room air in the ED. Now satting normally on room air.   # Hypokalemia # Hypomagnesemia Resolved with repletion - monitor  # Hyponatremia Likely 2/2 diuresis, na nadir to 124, improved to 129 today with pause in diuresis. AM cortisol wnl, TSH wnl. Baseline sodium is low normal, around 135. - monitor   # HTN Here bp low normal  DVT prophylaxis: home coumadin Code Status: dnr Family Communication: husband updated telephonically 10/16  Level of care: Progressive Status is: Inpatient Remains inpatient appropriate because: need for ongoing inpt w/u   Consultants:  cardiology  Procedures: none  Antimicrobials:  none    Subjective: Reports breathing improved but sob when ambulating to toilet. Tolerating diet, no chest pain  Objective: Vitals:   02/22/22 1927 02/22/22 2333 02/23/22 0451 02/23/22 0811  BP: (!) 110/59 101/75 127/79 120/78  Pulse: 89 (!) 103 (!) 107 (!) 108  Resp: '19 19 14 18  '$ Temp: 98.1 F (36.7 C) 98.4 F (36.9 C) 98.2 F (36.8 C) 97.8 F (36.6 C)  TempSrc: Oral Oral Oral   SpO2: 96% 95% 97% 95%  Weight:  Height:        Intake/Output Summary (Last 24 hours) at 02/23/2022 1018 Last data filed at 02/23/2022 0451 Gross per 24 hour  Intake 17.85 ml  Output 1900 ml  Net -1882.15 ml   Filed Weights   02/20/22 0639 02/21/22 1854 02/22/22 0115  Weight: 88 kg 87.2 kg 84.7 kg    Examination:  Constitutional: No acute distress Head: Atraumatic Eyes: Conjunctiva clear ENM: Moist mucous membranes. Normal dentition.  Respiratory: rales at  bases Cardiovascular: tachycardic, irreg irreg Abdomen: Non-tender, non-distended. No masses. No rebound or guarding. Positive bowel sounds. Musculoskeletal: No joint deformity upper and lower extremities. Normal ROM, no contractures. Normal muscle tone.  Skin: No rashes, lesions, or ulcers.  Extremities: mod peripheral edema Neurologic: Alert, moving all 4 extremities. Psychiatric: Normal insight and judgement.    Data Reviewed: I have personally reviewed following labs and imaging studies  CBC: Recent Labs  Lab 02/20/22 0652  WBC 5.4  NEUTROABS 4.0  HGB 10.4*  HCT 33.4*  MCV 96.0  PLT 706   Basic Metabolic Panel: Recent Labs  Lab 02/20/22 0652 02/20/22 0850 02/21/22 0403 02/21/22 0412 02/22/22 0508 02/22/22 1359 02/23/22 0538  NA 136  --  133*  --  127* 124* 129*  K 3.1*  --  3.7  --  3.8 3.7 3.7  CL 106  --  100  --  94* 94* 98  CO2 22  --  25  --  '24 24 24  '$ GLUCOSE 117*  --  94  --  86 117* 86  BUN 16  --  21  --  '19 22 22  '$ CREATININE 0.92  --  1.06*  --  1.05* 1.18* 1.03*  CALCIUM 8.6*  --  9.0  --  8.3* 8.1* 8.6*  MG  --  1.4*  --  1.8  --   --   --    GFR: Estimated Creatinine Clearance: 43.2 mL/min (A) (by C-G formula based on SCr of 1.03 mg/dL (H)). Liver Function Tests: Recent Labs  Lab 02/20/22 0652  AST 67*  ALT 44  ALKPHOS 107  BILITOT 1.1  PROT 5.8*  ALBUMIN 3.0*   No results for input(s): "LIPASE", "AMYLASE" in the last 168 hours. No results for input(s): "AMMONIA" in the last 168 hours. Coagulation Profile: Recent Labs  Lab 02/20/22 0652 02/21/22 0403 02/22/22 0508 02/23/22 0538  INR 2.5* 2.7* 3.1* 2.9*   Cardiac Enzymes: No results for input(s): "CKTOTAL", "CKMB", "CKMBINDEX", "TROPONINI" in the last 168 hours. BNP (last 3 results) No results for input(s): "PROBNP" in the last 8760 hours. HbA1C: No results for input(s): "HGBA1C" in the last 72 hours. CBG: No results for input(s): "GLUCAP" in the last 168 hours. Lipid  Profile: No results for input(s): "CHOL", "HDL", "LDLCALC", "TRIG", "CHOLHDL", "LDLDIRECT" in the last 72 hours. Thyroid Function Tests: Recent Labs    02/20/22 1127  TSH 1.138   Anemia Panel: No results for input(s): "VITAMINB12", "FOLATE", "FERRITIN", "TIBC", "IRON", "RETICCTPCT" in the last 72 hours. Urine analysis:    Component Value Date/Time   COLORURINE YELLOW (A) 01/15/2022 0518   APPEARANCEUR HAZY (A) 01/15/2022 0518   LABSPEC 1.003 (L) 01/15/2022 0518   PHURINE 6.0 01/15/2022 0518   GLUCOSEU NEGATIVE 01/15/2022 0518   HGBUR SMALL (A) 01/15/2022 0518   BILIRUBINUR NEGATIVE 01/15/2022 0518   KETONESUR NEGATIVE 01/15/2022 0518   PROTEINUR NEGATIVE 01/15/2022 0518   NITRITE NEGATIVE 01/15/2022 0518   LEUKOCYTESUR MODERATE (A) 01/15/2022 0518   Sepsis Labs: '@LABRCNTIP'$ (procalcitonin:4,lacticidven:4)  )  No results found for this or any previous visit (from the past 240 hour(s)).       Radiology Studies: ECHOCARDIOGRAM COMPLETE  Result Date: 02/22/2022    ECHOCARDIOGRAM REPORT   Patient Name:   Kristin Mitchell [facility] Date of Exam: 02/22/2022 Medical Rec #:  [id number]             Height:       62.0 in Accession #:    [id number]            Weight:       186.7 lb Date of Birth:  02-26-1941              BSA:          1.857 m Patient Age:    29 years              BP:           112/98 mmHg Patient Gender: F                     HR:           94 bpm. Exam Location:  [facility] Procedure: 2D Echo Indications:     CHF I50.31  History:         Patient has prior history of Echocardiogram examinations, most                  recent 12/29/2021.  Sonographer:     Kathlen Brunswick RDCS Referring Phys:  [place] [facility] Diagnosing Phys: Yolonda Kida MD IMPRESSIONS  1. Left ventricular ejection fraction, by estimation, is 35 to 40%. The left ventricle has moderately decreased function. The left ventricle demonstrates global hypokinesis. Left ventricular diastolic parameters are consistent  with Grade I diastolic dysfunction (impaired relaxation).  2. Right ventricular systolic function is normal. The right ventricular size is normal.  3. The mitral valve is normal in structure. Moderate to severe mitral valve regurgitation.  4. Tricuspid valve regurgitation is moderate.  5. The aortic valve is normal in structure. Aortic valve regurgitation is not visualized. FINDINGS  Left Ventricle: Left ventricular ejection fraction, by estimation, is 35 to 40%. The left ventricle has moderately decreased function. The left ventricle demonstrates global hypokinesis. The left ventricular internal cavity size was normal in size. There is no left ventricular hypertrophy. Left ventricular diastolic parameters are consistent with Grade I diastolic dysfunction (impaired relaxation). Right Ventricle: The right ventricular size is normal. No increase in right ventricular wall thickness. Right ventricular systolic function is normal. Left Atrium: Left atrial size was normal in size. Right Atrium: Right atrial size was normal in size. Pericardium: There is no evidence of pericardial effusion. Mitral Valve: The mitral valve is normal in structure. Moderate to severe mitral valve regurgitation. Tricuspid Valve: The tricuspid valve is normal in structure. Tricuspid valve regurgitation is moderate. Aortic Valve: The aortic valve is normal in structure. Aortic valve regurgitation is not visualized. Aortic valve peak gradient measures 7.6 mmHg. Pulmonic Valve: The pulmonic valve was normal in structure. Pulmonic valve regurgitation is mild. Aorta: The ascending aorta was not well visualized. IAS/Shunts: No atrial level shunt detected by color flow Doppler.  LEFT VENTRICLE PLAX 2D LVIDd:         4.60 cm     Diastology LVIDs:         3.50 cm     LV e' medial:    7.62 cm/s LV PW:  1.10 cm     LV E/e' medial:  15.5 LV IVS:        1.30 cm     LV e' lateral:   12.75 cm/s LVOT diam:     1.80 cm     LV E/e' lateral: 9.3 LV SV:          31 LV SV Index:   17 LVOT Area:     2.54 cm  LV Volumes (MOD) LV vol d, MOD A2C: 52.7 ml LV vol d, MOD A4C: 51.2 ml LV vol s, MOD A2C: 32.3 ml LV vol s, MOD A4C: 28.6 ml LV SV MOD A2C:     20.4 ml LV SV MOD A4C:     51.2 ml LV SV MOD BP:      21.4 ml RIGHT VENTRICLE RV Basal diam:  2.80 cm RV S prime:     7.24 cm/s TAPSE (M-mode): 1.6 cm LEFT ATRIUM           Index        RIGHT ATRIUM           Index LA diam:      4.30 cm 2.32 cm/m   RA Area:     14.90 cm LA Vol (A4C): 49.6 ml 26.71 ml/m  RA Volume:   34.90 ml  18.80 ml/m  AORTIC VALVE                 PULMONIC VALVE AV Area (Vmax): 1.23 cm     PV Vmax:          1.09 m/s AV Vmax:        138.25 cm/s  PV Peak grad:     4.8 mmHg AV Peak Grad:   7.6 mmHg     PR End Diast Vel: 7.84 msec LVOT Vmax:      66.67 cm/s LVOT Vmean:     44.000 cm/s LVOT VTI:       0.121 m  AORTA Ao Root diam: 3.10 cm Ao Asc diam:  2.80 cm MITRAL VALVE                  TRICUSPID VALVE MV Area (PHT): 3.63 cm       TV Peak grad:   27.8 mmHg MV Decel Time: 209 msec       TV Vmax:        2.64 m/s MR Peak grad:    106.1 mmHg MR Mean grad:    62.0 mmHg    SHUNTS MR Vmax:         515.00 cm/s  Systemic VTI:  0.12 m MR Vmean:        365.0 cm/s   Systemic Diam: 1.80 cm MR PISA:         1.01 cm MR PISA Eff ROA: 6 mm MR PISA Radius:  0.40 cm MV E velocity: 118.00 cm/s Dwayne D Callwood MD Electronically signed by Yolonda Kida MD Signature Date/Time: 02/22/2022/9:53:14 AM    Final         Scheduled Meds:  atorvastatin  80 mg Oral QHS   metoprolol tartrate  50 mg Oral BID   sodium chloride flush  3 mL Intravenous Q12H   Warfarin - Pharmacist Dosing Inpatient   Does not apply q1600   Continuous Infusions:  sodium chloride       LOS: 3 days     Desma Maxim, MD [facility]   If 7PM-7AM, please contact night-coverage [email or web address] Password [email or web address]  02/23/2022, 10:18 AM

## 2022-02-23 NOTE — Progress Notes (Signed)
Physical Therapy Treatment Patient Details Name: Kristin Mitchell MRN: [id number] DOB: [birth year] Today's Date: 02/23/2022   History of Present Illness Pt is an 81 y/o F admitted on 02/20/22 after presenting with c/c of palpitations & dyspnea. Pt is being treated for a-fib with RVR. PMH: paroxysmal a-fib, HFpEF, HTN, breast CA, arthritis    PT Comments    Pt had been up in recliner for hours and wanted to get back in bed.  Pt was willing to work with this PTA in room.  Pt demonstrates progressing with functional balance and activity tolerance performing transfers and household type gait with RW at supervision level on RA (SPO2 stayed around 96-98%, HR 118 bpm) and only needed light Min A with getting LE in bed.  Pt reviewed/performed bil LE strengthening in bed and tolerated well.  Current PT d/c plan is appropriate.  Recommendations for follow up therapy are one component of a multi-disciplinary discharge planning process, led by the attending physician.  Recommendations may be updated based on patient status, additional functional criteria and insurance authorization.  Follow Up Recommendations  Home health PT     Assistance Recommended at Discharge Intermittent Supervision/Assistance  Patient can return home with the following A little help with bathing/dressing/bathroom;A little help with walking and/or transfers;Assistance with cooking/housework;Help with stairs or ramp for entrance   Equipment Recommendations       Recommendations for Other Services       Precautions / Restrictions Precautions Precautions: Fall Restrictions Weight Bearing Restrictions: No     Mobility  Bed Mobility Overal bed mobility: Needs Assistance Bed Mobility: Sit to Supine       Sit to supine: Min assist (Assist with getting one LE in bed.)   General bed mobility comments: cues for sequencing and how to scoot in bed.    Transfers Overall transfer level: Needs assistance Equipment used:  Rolling walker (2 wheels) Transfers: Sit to/from Stand Sit to Stand: Supervision           General transfer comment: no trouble getting up from recliner.    Ambulation/Gait Ambulation/Gait assistance: Supervision Gait Distance (Feet): 15 Feet Assistive device: Rolling walker (2 wheels) Gait Pattern/deviations: Decreased step length - right, Decreased step length - left, Decreased stride length, Decreased dorsiflexion - right, Decreased dorsiflexion - left Gait velocity: decreased         Stairs             Wheelchair Mobility    Modified Rankin (Stroke Patients Only)       Balance Overall balance assessment: Modified Independent Sitting-balance support: No upper extremity supported, Feet supported Sitting balance-Leahy Scale: Good     Standing balance support: Bilateral upper extremity supported, During functional activity, Reliant on assistive device for balance Standing balance-Leahy Scale: Good                              Cognition Arousal/Alertness: Awake/alert Behavior During Therapy: WFL for tasks assessed/performed Overall Cognitive Status: Within Functional Limits for tasks assessed                                          Exercises Total Joint Exercises Ankle Circles/Pumps: Strengthening, AROM, Both, 5 reps Quad Sets: AROM, Strengthening, Both, 5 reps Gluteal Sets: AROM, Strengthening, Both, 5 reps    General Comments  Pertinent Vitals/Pain Pain Assessment Pain Assessment: No/denies pain    Home Living                          Prior Function            PT Goals (current goals can now be found in the care plan section) Acute Rehab PT Goals Patient Stated Goal: get better PT Goal Formulation: With patient Time For Goal Achievement: 03/06/22 Potential to Achieve Goals: Good Progress towards PT goals: Progressing toward goals    Frequency    Min 2X/week      PT Plan Current  plan remains appropriate    Co-evaluation              AM-PAC PT "6 Clicks" Mobility   Outcome Measure  Help needed turning from your back to your side while in a flat bed without using bedrails?: None Help needed moving from lying on your back to sitting on the side of a flat bed without using bedrails?: None Help needed moving to and from a bed to a chair (including a wheelchair)?: A Little Help needed standing up from a chair using your arms (e.g., wheelchair or bedside chair)?: None Help needed to walk in hospital room?: A Little Help needed climbing 3-5 steps with a railing? : A Little 6 Click Score: 21    End of Session Equipment Utilized During Treatment: Gait belt Activity Tolerance: Patient tolerated treatment well Patient left: in bed;with call bell/phone within reach Nurse Communication: Mobility status PT Visit Diagnosis: Muscle weakness (generalized) (M62.81);History of falling (Z91.81)     Time: 0630-1601 PT Time Calculation (min) (ACUTE ONLY): 15 min  Charges:  $Therapeutic Activity: 8-22 mins                     Bjorn Loser, PTA  02/23/22, 12:48 PM

## 2022-02-23 NOTE — Care Management Important Message (Signed)
Important Message  Patient Details  Name: Kristin Mitchell MRN: [id number] Date of Birth: 1940-08-23   Medicare Important Message Given:  N/A - LOS <3 / Initial given by admissions     Dannette Barbara 02/23/2022, 9:26 AM

## 2022-02-23 NOTE — Consult Note (Signed)
ANTICOAGULATION CONSULT NOTE   Pharmacy Consult for warfarin Indication: atrial fibrillation  Allergies  Allergen Reactions   Penicillins Rash    Patient Measurements: Height: '5\' 2"'$  (157.5 cm) Weight: 84.7 kg (186 lb 11.7 oz) IBW/kg (Calculated) : 50.1  Vital Signs: Temp: 98.2 F (36.8 C) (10/16 1446) Temp Source: Oral (10/16 1446) BP: 100/62 (10/16 1505) Pulse Rate: 83 (10/16 1505)  Labs: Recent Labs    02/21/22 0403 02/22/22 0508 02/22/22 1359 02/23/22 0538  LABPROT 28.8* 32.0*  --  29.9*  INR 2.7* 3.1*  --  2.9*  CREATININE 1.06* 1.05* 1.18* 1.03*     Estimated Creatinine Clearance: 43.2 mL/min (A) (by C-G formula based on SCr of 1.03 mg/dL (H)).   Medical History: Past Medical History:  Diagnosis Date   A-fib [facility])    Arthritis    Breast cancer ([place]) 11/26/2014   Cancer ([place])    breast   Intermittent palpitations    in early am    Medications:  PTA warfarin 5 mg daily Per discussion with med rec technician who interviewed the patient: pt ran out of 2.5 mg tablets and was instructed by doctor to take the 5 mg tablets. However, latest documentation I can find that specifically references warfarin dosing is from 12/2021 and patient was instructed to take 2.5 mg. Latest dispense was warfarin 5 mg tablets.  Assessment: 81 yo F with PMH paroxysmal atrial fibrillation (CHADSVASc 5, on warfarin), HFpEF, HTN presenting with palpitations and dyspnea. Pt found to be in afib with RVR (HR 110s-120s) which is likely to have also triggered a HF exacerbation. Regarding anticoagulation for afib, pt's INR currently within goal 2-3. See above Medications section for information about PTA warfarin dosing. Per chart review, pt has h/o labile INR and Eliquis has been recommended to her in the past but is difficult for patient to afford.  Drug-drug interactions: Minor inhibitory effect of diltiazem on warfarin could increase  INR.  Date Dose INR Comment 10/13 '5mg'$  2.5      10/14 '5mg'$  2.7 (Delta 0.2); therapeutic 10/15 Zero 3.1 (Delta 0.4); slightly supratherapeutic 10/16 2 mg 2.9 Trending down, therapeutic.   Goal of Therapy:  INR 2-3   Plan:  INR is therapeutic but trending down. Will give warfarin 2 mg x 1 tonight. Daily INR. CBC at least every 3 days.   Eleonore Chiquito, PharmD, BCPS Clinical Pharmacist 02/23/2022 3:39 PM

## 2022-02-24 DIAGNOSIS — I4891 Unspecified atrial fibrillation: Secondary | ICD-10-CM | POA: Diagnosis not present

## 2022-02-24 LAB — BASIC METABOLIC PANEL
Anion gap: 7 (ref 5–15)
BUN: 20 mg/dL (ref 8–23)
CO2: 21 mmol/L — ABNORMAL LOW (ref 22–32)
Calcium: 8.4 mg/dL — ABNORMAL LOW (ref 8.9–10.3)
Chloride: 104 mmol/L (ref 98–111)
Creatinine, Ser: 1 mg/dL (ref 0.44–1.00)
GFR, Estimated: 57 mL/min — ABNORMAL LOW (ref >60)
Glucose, Bld: 86 mg/dL (ref 70–99)
Potassium: 4.3 mmol/L (ref 3.5–5.1)
Sodium: 132 mmol/L — ABNORMAL LOW (ref 135–145)

## 2022-02-24 LAB — PROTIME-INR
INR: 2.7 — ABNORMAL HIGH (ref 0.8–1.2)
Prothrombin Time: 28.6 seconds — ABNORMAL HIGH (ref 11.4–15.2)

## 2022-02-24 LAB — MAGNESIUM: Magnesium: 1.8 mg/dL (ref 1.7–2.4)

## 2022-02-24 MED ORDER — METOPROLOL TARTRATE 50 MG PO TABS
100.0000 mg | ORAL_TABLET | Freq: Two times a day (BID) | ORAL | Status: DC
  Administered 2022-02-24 – 2022-02-26 (×5): 100 mg via ORAL
  Filled 2022-02-24 (×5): qty 2

## 2022-02-24 MED ORDER — MAGNESIUM SULFATE IN D5W 1-5 GM/100ML-% IV SOLN
1.0000 g | Freq: Once | INTRAVENOUS | Status: AC
  Administered 2022-02-24: 1 g via INTRAVENOUS
  Filled 2022-02-24: qty 100

## 2022-02-24 MED ORDER — WARFARIN SODIUM 2.5 MG PO TABS
2.5000 mg | ORAL_TABLET | Freq: Once | ORAL | Status: AC
  Administered 2022-02-24: 2.5 mg via ORAL
  Filled 2022-02-24: qty 1

## 2022-02-24 MED ORDER — FUROSEMIDE 10 MG/ML IJ SOLN
20.0000 mg | Freq: Every day | INTRAMUSCULAR | Status: DC
  Administered 2022-02-24 – 2022-02-26 (×3): 20 mg via INTRAVENOUS
  Filled 2022-02-24 (×3): qty 2

## 2022-02-24 NOTE — Progress Notes (Signed)
[facility] Encounter Note  Patient: Kristin Mitchell / Admit Date: 02/20/2022 / Date of Encounter: 02/24/2022, 8:59 AM   Subjective: Patient overall feels better today than yesterday and is ambulating slightly with no evidence of significant cardiac symptoms.  The patient has had atrial fibrillation with rapid ventricular rate now slightly better controlled with metoprolol and will need increased doses of metoprolol for better heart rate control.  In addition to that the patient has been on warfarin for which she currently is appropriate for further risk reduction of cardiovascular event.  Echocardiogram has shown LV systolic dysfunction which may be due to above and may resolve with better heart rate control and management of atrial fibrillation.  There some heart failure symptoms with pulmonary edema on admission now resolved.  Review of Systems: Positive for: Shortness of breath Negative for: Vision change, hearing change, syncope, dizziness, nausea, vomiting,diarrhea, bloody stool, stomach pain, cough, congestion, diaphoresis, urinary frequency, urinary pain,skin lesions, skin rashes Others previously listed  Objective: Telemetry: Atrial fibrillation with controlled ventricular rate Physical Exam: Blood pressure 125/87, pulse (!) 110, temperature 98.3 F (36.8 C), temperature source Oral, resp. rate 19, height '5\' 2"'$  (1.575 m), weight 84.7 kg, SpO2 97 %. Body mass index is 34.15 kg/m. General: Well developed, well nourished, in no acute distress. Head: Normocephalic, atraumatic, sclera non-icteric, no xanthomas, nares are without discharge. Neck: No apparent masses Lungs: Normal respirations with few wheezes, no rhonchi, no rales , no crackles   Heart: Irregular rate and rhythm, normal S1 S2, no murmur, no rub, no gallop, PMI is normal size and placement, carotid upstroke normal without bruit, jugular venous pressure normal Abdomen: Soft, non-tender,  non-distended with normoactive bowel sounds. No hepatosplenomegaly. Abdominal aorta is normal size without bruit Extremities: No edema, no clubbing, no cyanosis, no ulcers,  Peripheral: 2+ radial, 2+ femoral, 2+ dorsal pedal pulses Neuro: Alert and oriented. Moves all extremities spontaneously. Psych:  Responds to questions appropriately with a normal affect.   Intake/Output Summary (Last 24 hours) at 02/24/2022 0859 Last data filed at 02/24/2022 0338 Gross per 24 hour  Intake 600 ml  Output 1100 ml  Net -500 ml     Inpatient Medications:   atorvastatin  80 mg Oral QHS   metoprolol tartrate  100 mg Oral BID   sodium chloride flush  3 mL Intravenous Q12H   warfarin  2.5 mg Oral ONCE-1600   Warfarin - Pharmacist Dosing Inpatient   Does not apply q1600   Infusions:   sodium chloride     magnesium sulfate bolus IVPB      Labs: Recent Labs    02/23/22 0538 02/24/22 0617  NA 129* 132*  K 3.7 4.3  CL 98 104  CO2 24 21*  GLUCOSE 86 86  BUN 22 20  CREATININE 1.03* 1.00  CALCIUM 8.6* 8.4*  MG  --  1.8    No results for input(s): "AST", "ALT", "ALKPHOS", "BILITOT", "PROT", "ALBUMIN" in the last 72 hours. No results for input(s): "WBC", "NEUTROABS", "HGB", "HCT", "MCV", "PLT" in the last 72 hours. No results for input(s): "CKTOTAL", "CKMB", "TROPONINI" in the last 72 hours. Invalid input(s): "POCBNP" No results for input(s): "HGBA1C" in the last 72 hours.   Weights: Filed Weights   02/20/22 0639 02/21/22 1854 02/22/22 0115  Weight: 88 kg 87.2 kg 84.7 kg     Radiology/Studies:  ECHOCARDIOGRAM COMPLETE  Result Date: 02/22/2022    ECHOCARDIOGRAM REPORT   Patient Name:   Kristin Mitchell [facility] Date of  Exam: 02/22/2022 Medical Rec #:  [id number]             Height:       62.0 in Accession #:    [id number]            Weight:       186.7 lb Date of Birth:  Sep 26, 1940              BSA:          1.857 m Patient Age:    81 years              BP:           112/98 mmHg Patient  Gender: F                     HR:           94 bpm. Exam Location:  [facility] Procedure: 2D Echo Indications:     CHF I50.31  History:         Patient has prior history of Echocardiogram examinations, most                  recent 12/29/2021.  Sonographer:     Kathlen Brunswick RDCS Referring Phys:  [place] MGQQ Diagnosing Phys: Yolonda Kida MD IMPRESSIONS  1. Left ventricular ejection fraction, by estimation, is 35 to 40%. The left ventricle has moderately decreased function. The left ventricle demonstrates global hypokinesis. Left ventricular diastolic parameters are consistent with Grade I diastolic dysfunction (impaired relaxation).  2. Right ventricular systolic function is normal. The right ventricular size is normal.  3. The mitral valve is normal in structure. Moderate to severe mitral valve regurgitation.  4. Tricuspid valve regurgitation is moderate.  5. The aortic valve is normal in structure. Aortic valve regurgitation is not visualized. FINDINGS  Left Ventricle: Left ventricular ejection fraction, by estimation, is 35 to 40%. The left ventricle has moderately decreased function. The left ventricle demonstrates global hypokinesis. The left ventricular internal cavity size was normal in size. There is no left ventricular hypertrophy. Left ventricular diastolic parameters are consistent with Grade I diastolic dysfunction (impaired relaxation). Right Ventricle: The right ventricular size is normal. No increase in right ventricular wall thickness. Right ventricular systolic function is normal. Left Atrium: Left atrial size was normal in size. Right Atrium: Right atrial size was normal in size. Pericardium: There is no evidence of pericardial effusion. Mitral Valve: The mitral valve is normal in structure. Moderate to severe mitral valve regurgitation. Tricuspid Valve: The tricuspid valve is normal in structure. Tricuspid valve regurgitation is moderate. Aortic Valve: The aortic valve is normal in  structure. Aortic valve regurgitation is not visualized. Aortic valve peak gradient measures 7.6 mmHg. Pulmonic Valve: The pulmonic valve was normal in structure. Pulmonic valve regurgitation is mild. Aorta: The ascending aorta was not well visualized. IAS/Shunts: No atrial level shunt detected by color flow Doppler.  LEFT VENTRICLE PLAX 2D LVIDd:         4.60 cm     Diastology LVIDs:         3.50 cm     LV e' medial:    7.62 cm/s LV PW:         1.10 cm     LV E/e' medial:  15.5 LV IVS:        1.30 cm     LV e' lateral:   12.75 cm/s LVOT diam:     1.80 cm  LV E/e' lateral: 9.3 LV SV:         31 LV SV Index:   17 LVOT Area:     2.54 cm  LV Volumes (MOD) LV vol d, MOD A2C: 52.7 ml LV vol d, MOD A4C: 51.2 ml LV vol s, MOD A2C: 32.3 ml LV vol s, MOD A4C: 28.6 ml LV SV MOD A2C:     20.4 ml LV SV MOD A4C:     51.2 ml LV SV MOD BP:      21.4 ml RIGHT VENTRICLE RV Basal diam:  2.80 cm RV S prime:     7.24 cm/s TAPSE (M-mode): 1.6 cm LEFT ATRIUM           Index        RIGHT ATRIUM           Index LA diam:      4.30 cm 2.32 cm/m   RA Area:     14.90 cm LA Vol (A4C): 49.6 ml 26.71 ml/m  RA Volume:   34.90 ml  18.80 ml/m  AORTIC VALVE                 PULMONIC VALVE AV Area (Vmax): 1.23 cm     PV Vmax:          1.09 m/s AV Vmax:        138.25 cm/s  PV Peak grad:     4.8 mmHg AV Peak Grad:   7.6 mmHg     PR End Diast Vel: 7.84 msec LVOT Vmax:      66.67 cm/s LVOT Vmean:     44.000 cm/s LVOT VTI:       0.121 m  AORTA Ao Root diam: 3.10 cm Ao Asc diam:  2.80 cm MITRAL VALVE                  TRICUSPID VALVE MV Area (PHT): 3.63 cm       TV Peak grad:   27.8 mmHg MV Decel Time: 209 msec       TV Vmax:        2.64 m/s MR Peak grad:    106.1 mmHg MR Mean grad:    62.0 mmHg    SHUNTS MR Vmax:         515.00 cm/s  Systemic VTI:  0.12 m MR Vmean:        365.0 cm/s   Systemic Diam: 1.80 cm MR PISA:         1.01 cm MR PISA Eff ROA: 6 mm MR PISA Radius:  0.40 cm MV E velocity: 118.00 cm/s Yolonda Kida MD Electronically signed  by Yolonda Kida MD Signature Date/Time: 02/22/2022/9:53:14 AM    Final    DG Chest 2 View  Result Date: 02/20/2022 CLINICAL DATA:  Palpitations and shortness of breath.  Tachycardia. EXAM: CHEST - 2 VIEW COMPARISON:  One-view chest x-ray 01/15/2022 FINDINGS: The heart is enlarged, exaggerated by low lung volumes. Right perihilar and left basilar airspace opacities are present. Left greater than right pleural effusions are present. Mild interstitial edema is noted. Surgical clips are present in the axillary regions bilaterally. Remote right acromial fracture noted. IMPRESSION: 1. Cardiomegaly with mild interstitial edema and bilateral pleural effusions compatible with congestive heart failure. 2. Right perihilar and left basilar airspace disease likely reflects atelectasis. Electronically Signed   By: San Morelle M.D.   On: 02/20/2022 07:08   US Abdomen Limited RUQ (LIVER/GB)  Result Date: 02/12/2022  CLINICAL DATA:  Abnormal LFTs.  Prior cholecystectomy. EXAM: ULTRASOUND ABDOMEN LIMITED RIGHT UPPER QUADRANT COMPARISON:  Ultrasound abdomen January 15, 2022; MRI abdomen December 19, 2021 FINDINGS: Gallbladder: Surgically absent Common bile duct: Diameter: 4 mm Liver: No focal lesion identified. Within normal limits in parenchymal echogenicity. Portal vein is patent on color Doppler imaging with normal direction of blood flow towards the liver. Other: Redemonstrated 3.7 x 3.1 x 3.6 cm exophytic mass off the superior pole of the right kidney, previously measuring 3.7 x 3.6 x 2.7 cm. Right pleural effusion. IMPRESSION: 1. Redemonstrated exophytic mass off the superior pole of the right kidney. This was better evaluated on MRI abdomen December 20, 2021. Recommend follow-up as per report on that examination (MRI abdomen with and without contrast 6 months from prior MR abdomen). 2. Prior cholecystectomy. 3. Right pleural effusion. Electronically Signed   By: Lovey Newcomer M.D.   On: 02/12/2022 10:03      Assessment and Recommendation  81 y.o. female with acute systolic dysfunction congestive heart failure likely secondary to atrial fibrillation with rapid ventricular rate now more controlled on metoprolol without evidence of myocardial infarction 1.  Increase metoprolol to 100 mg twice per day for better heart rate control with a heart rate goal below 100 bpm with ambulation 2.  Avoid diltiazem at this point due to concerns of LV dysfunction until possible outpatient evaluation 3.  If more hypotension would discontinue hydralazine if necessary and will consider the possibility of ACE inhibitor or an angiotensin receptor blocker instead if glomerular filtration rate stable 4.  Continue anticoagulation for further risk reduction in stroke with atrial fibrillation with warfarin with a goal INR between 2 and 3 5.  If patient ambulating well with no further significant symptoms on current medical regimen would consider possible discharged home with further adjustments of medication management after there  Signed, Serafina Royals M.D. FACC

## 2022-02-24 NOTE — Progress Notes (Signed)
PROGRESS NOTE    Kristin Mitchell  QIH:[id number] DOB: 1940-05-27 DOA: 02/20/2022 PCP: Latanya Maudlin, NP  Outpatient Specialists: cardiology    Brief Narrative:   From admission h and p Admitted last month for a fib with rvr, uti.   Couple of weeks ago visited PCP complaining of leg swelling and some shortness of breath. Reports that for the last few days has had worsening fatigue and shortness of breath, worse with activity or recumbence. No cough or fever or chest pain. No vomiting or diarrhea. PCP started torsemide. Reports legs remain swollen. Also with palpitations and presyncope. Because of worsening SOB husband alerted EMS. Reportedly hypoxic for them and here per EDP 88% on room air. Found to be in a fib with rvr via ems. Given 10 of dilt per EMS. In our ED also given IV metop and lasix. Currently reports feeling somewhat improved   Assessment & Plan:   Principal Problem:   Atrial fibrillation with rapid ventricular response (HCC) Active Problems:   Chronic diastolic CHF (congestive heart failure) (HCC)   Essential hypertension   Breast cancer of upper-outer quadrant of right female breast ([place])   # A-fib with RVR Hemodynamically stable. Received dilt IV with EMS and IV and then oral metop here. On metop succinate 50 qd at home. Metoprolol up-titrated to 50 bid without resolution of RVR. Dilt gtt started but patient hypotensive with that and TTE showed new systolic dysfunction so that was stopped. Cardiology brought on board 10/15. Metoprolol re-instituted by cardiology on 10/16 and today up-titrated to 100 bid. Unfortunately, she remains in RVR with resting heart rates ranging 100s-120s and remains symptomatic with ambulation - continue tele - coumadin per pharmacy, daily inr - cont metop, may need to consider alternate agent if 100 bid doesn't adequately control rate - resume diuresis today as below - PT advises HH PT   # HFrEF with exacerbation Exacerbation  likely mediated by a fib. Pulm edema, hypoxia, and elevated bnp. No chest pain and trop wnl, doubt ischemia. Robust response to lasix  is out over 6 liters. TTE this morning now with reduced ef of 35-40 (normal EF 2 months ago), grade 1 dd, moderate to severe MR, mod TR - resume lasix today, 20 IV qd   # Acute hypoxic respiratory failure 2/2 pulm edema from a fib and chf. Normal o2 and breathing comfortably on 2 L on admission. Per EDP was 88% on room air in the ED. Now satting normally on room air.   # Hypokalemia # Hypomagnesemia Resolved with repletion - monitor  # Hyponatremia Likely 2/2 diuresis, na nadir to 124, improved to 132 today with pause in diuresis. AM cortisol wnl, TSH wnl. Baseline sodium is low normal, around 135. - cautiously resuming diuresis today   # HTN Here bp low normal  DVT prophylaxis: home coumadin Code Status: dnr Family Communication: husband updated telephonically 10/17  Level of care: Progressive Status is: Inpatient Remains inpatient appropriate because: need for ongoing inpt w/u   Consultants:  cardiology  Procedures: none  Antimicrobials:  none    Subjective: Reports breathing improved but still short of breath with minimal ambulation  Objective: Vitals:   02/24/22 0338 02/24/22 0807 02/24/22 1156 02/24/22 1200  BP: 122/75 125/87 105/62   Pulse: (!) 106 (!) 110 (!) 108   Resp: '18 19 20 19  '$ Temp: 98.4 F (36.9 C) 98.3 F (36.8 C) 98.1 F (36.7 C)   TempSrc: Oral Oral Oral   SpO2: 96% 97%  97%   Weight:      Height:        Intake/Output Summary (Last 24 hours) at 02/24/2022 1458 Last data filed at 02/24/2022 1157 Gross per 24 hour  Intake 480 ml  Output 2050 ml  Net -1570 ml   Filed Weights   02/20/22 0639 02/21/22 1854 02/22/22 0115  Weight: 88 kg 87.2 kg 84.7 kg    Examination:  Constitutional: No acute distress Head: Atraumatic Eyes: Conjunctiva clear ENM: Moist mucous membranes. Normal dentition.   Respiratory: rales at bases Cardiovascular: tachycardic, irreg irreg Abdomen: Non-tender, non-distended. No masses. No rebound or guarding. Positive bowel sounds. Musculoskeletal: No joint deformity upper and lower extremities. Normal ROM, no contractures. Normal muscle tone.  Skin: No rashes, lesions, or ulcers.  Extremities: mod peripheral edema Neurologic: Alert, moving all 4 extremities. Psychiatric: Normal insight and judgement.    Data Reviewed: I have personally reviewed following labs and imaging studies  CBC: Recent Labs  Lab 02/20/22 0652  WBC 5.4  NEUTROABS 4.0  HGB 10.4*  HCT 33.4*  MCV 96.0  PLT 194   Basic Metabolic Panel: Recent Labs  Lab 02/20/22 0850 02/21/22 0403 02/21/22 0412 02/22/22 0508 02/22/22 1359 02/23/22 0538 02/24/22 0617  NA  --  133*  --  127* 124* 129* 132*  K  --  3.7  --  3.8 3.7 3.7 4.3  CL  --  100  --  94* 94* 98 104  CO2  --  25  --  '24 24 24 '$ 21*  GLUCOSE  --  94  --  86 117* 86 86  BUN  --  21  --  '19 22 22 20  '$ CREATININE  --  1.06*  --  1.05* 1.18* 1.03* 1.00  CALCIUM  --  9.0  --  8.3* 8.1* 8.6* 8.4*  MG 1.4*  --  1.8  --   --   --  1.8   GFR: Estimated Creatinine Clearance: 44.5 mL/min (by C-G formula based on SCr of 1 mg/dL). Liver Function Tests: Recent Labs  Lab 02/20/22 0652  AST 67*  ALT 44  ALKPHOS 107  BILITOT 1.1  PROT 5.8*  ALBUMIN 3.0*   No results for input(s): "LIPASE", "AMYLASE" in the last 168 hours. No results for input(s): "AMMONIA" in the last 168 hours. Coagulation Profile: Recent Labs  Lab 02/20/22 0652 02/21/22 0403 02/22/22 0508 02/23/22 0538 02/24/22 0617  INR 2.5* 2.7* 3.1* 2.9* 2.7*   Cardiac Enzymes: No results for input(s): "CKTOTAL", "CKMB", "CKMBINDEX", "TROPONINI" in the last 168 hours. BNP (last 3 results) No results for input(s): "PROBNP" in the last 8760 hours. HbA1C: No results for input(s): "HGBA1C" in the last 72 hours. CBG: No results for input(s): "GLUCAP" in the  last 168 hours. Lipid Profile: No results for input(s): "CHOL", "HDL", "LDLCALC", "TRIG", "CHOLHDL", "LDLDIRECT" in the last 72 hours. Thyroid Function Tests: No results for input(s): "TSH", "T4TOTAL", "FREET4", "T3FREE", "THYROIDAB" in the last 72 hours.  Anemia Panel: No results for input(s): "VITAMINB12", "FOLATE", "FERRITIN", "TIBC", "IRON", "RETICCTPCT" in the last 72 hours. Urine analysis:    Component Value Date/Time   COLORURINE YELLOW (A) 01/15/2022 0518   APPEARANCEUR HAZY (A) 01/15/2022 0518   LABSPEC 1.003 (L) 01/15/2022 0518   PHURINE 6.0 01/15/2022 0518   GLUCOSEU NEGATIVE 01/15/2022 0518   HGBUR SMALL (A) 01/15/2022 0518   BILIRUBINUR NEGATIVE 01/15/2022 0518   KETONESUR NEGATIVE 01/15/2022 0518   PROTEINUR NEGATIVE 01/15/2022 0518   NITRITE NEGATIVE 01/15/2022 0518  LEUKOCYTESUR MODERATE (A) 01/15/2022 0518   Sepsis Labs: '@LABRCNTIP'$ (procalcitonin:4,lacticidven:4)  )No results found for this or any previous visit (from the past 240 hour(s)).       Radiology Studies: No results found.      Scheduled Meds:  atorvastatin  80 mg Oral QHS   metoprolol tartrate  100 mg Oral BID   sodium chloride flush  3 mL Intravenous Q12H   warfarin  2.5 mg Oral ONCE-1600   Warfarin - Pharmacist Dosing Inpatient   Does not apply q1600   Continuous Infusions:  sodium chloride       LOS: 4 days     Desma Maxim, MD [facility]   If 7PM-7AM, please contact night-coverage [email or web address] Password [email or web address] 02/24/2022, 2:58 PM

## 2022-02-24 NOTE — Progress Notes (Signed)
Mobility Specialist - Progress Note   02/24/22 1000  Mobility  Activity Ambulated with assistance in room  Level of Assistance Standby assist, set-up cues, supervision of patient - no hands on  Assistive Device Front wheel walker  Distance Ambulated (ft) 25 ft  Activity Response Tolerated well  $Mobility charge 1 Mobility     Pre-mobility: 112 HR During mobility: 134-143 HR Post-mobility: 116 HR   Pt lying in bed upon arrival, utilizing RA. Pt motivated for activity. Able to complete bed mobility modI with extra time. STS and ambulated in room with supervision. Does voice mild pain in L knee during activity, but no other complaints. Max HR 143 bpm while completing bed mobility, but maintained in mid 130s during ambulation. Pt returned to bed with assist on LE to return supine. Pt left in bed with alarm set, needs in reach.    Kathee Delton Mobility Specialist 02/24/22, 11:39 AM

## 2022-02-24 NOTE — Consult Note (Addendum)
ANTICOAGULATION CONSULT NOTE   Pharmacy Consult for warfarin Indication: atrial fibrillation  Allergies  Allergen Reactions   Penicillins Rash    Patient Measurements: Height: '5\' 2"'$  (157.5 cm) Weight: 84.7 kg (186 lb 11.7 oz) IBW/kg (Calculated) : 50.1  Vital Signs: Temp: 98.3 F (36.8 C) (10/17 0807) Temp Source: Oral (10/17 0807) BP: 125/87 (10/17 0807) Pulse Rate: 110 (10/17 0807)  Labs: Recent Labs    02/22/22 0508 02/22/22 1359 02/23/22 0538 02/24/22 0617  LABPROT 32.0*  --  29.9* 28.6*  INR 3.1*  --  2.9* 2.7*  CREATININE 1.05* 1.18* 1.03* 1.00     Estimated Creatinine Clearance: 44.5 mL/min (by C-G formula based on SCr of 1 mg/dL).   Medical History: Past Medical History:  Diagnosis Date   A-fib [facility])    Arthritis    Breast cancer ([place]) 11/26/2014   Cancer ([place])    breast   Intermittent palpitations    in early am    Medications:  PTA warfarin 5 mg daily Per discussion with med rec technician who interviewed the patient: pt ran out of 2.5 mg tablets and was instructed by doctor to take the 5 mg tablets. However, latest documentation I can find that specifically references warfarin dosing is from 12/2021 and patient was instructed to take 2.5 mg. Latest dispense was warfarin 5 mg tablets.  Assessment: 81 yo F with PMH paroxysmal atrial fibrillation (CHADSVASc 5, on warfarin), HFpEF, HTN presenting with palpitations and dyspnea. Pt found to be in afib with RVR (HR 110s-120s) which is likely to have also triggered a HF exacerbation. Regarding anticoagulation for afib, pt's INR currently within goal 2-3. See above Medications section for information about PTA warfarin dosing. Per chart review, pt has h/o labile INR and Eliquis has been recommended to her in the past but is difficult for patient to afford.  Drug-drug interactions: Minor inhibitory effect of diltiazem on warfarin could increase  INR.  Date Dose INR Comment 10/13 '5mg'$  2.5      10/14 '5mg'$  2.7 (Delta 0.2); therapeutic 10/15 Zero 3.1 (Delta 0.4); slightly supratherapeutic 10/16 Zero 2.9 Trending down, therapeutic.  10/17 2.'5mg'$  2.7 (Delta -0.2); therapeutic  Goal of Therapy:  INR 2-3   Plan:  INR is therapeutic, trended down w/in goal range.  Will give warfarin 2.5 mg x 1 tonight.  For discharge planning, would recommend 2.'5mg'$  daily for now (pt has '5mg'$  tablets available to split at home).  CTM Daily INR and CBC at least every 3 days.   Lorna Dibble, PharmD, [facility] Clinical Pharmacist 02/24/2022 8:12 AM

## 2022-02-25 DIAGNOSIS — E871 Hypo-osmolality and hyponatremia: Secondary | ICD-10-CM

## 2022-02-25 DIAGNOSIS — I5043 Acute on chronic combined systolic (congestive) and diastolic (congestive) heart failure: Secondary | ICD-10-CM

## 2022-02-25 DIAGNOSIS — I480 Paroxysmal atrial fibrillation: Secondary | ICD-10-CM

## 2022-02-25 LAB — BASIC METABOLIC PANEL
Anion gap: 9 (ref 5–15)
BUN: 17 mg/dL (ref 8–23)
CO2: 20 mmol/L — ABNORMAL LOW (ref 22–32)
Calcium: 8.2 mg/dL — ABNORMAL LOW (ref 8.9–10.3)
Chloride: 101 mmol/L (ref 98–111)
Creatinine, Ser: 0.89 mg/dL (ref 0.44–1.00)
GFR, Estimated: >60 mL/min (ref >60)
Glucose, Bld: 82 mg/dL (ref 70–99)
Potassium: 3.9 mmol/L (ref 3.5–5.1)
Sodium: 130 mmol/L — ABNORMAL LOW (ref 135–145)

## 2022-02-25 LAB — PROTIME-INR
INR: 2.7 — ABNORMAL HIGH (ref 0.8–1.2)
Prothrombin Time: 28.7 seconds — ABNORMAL HIGH (ref 11.4–15.2)

## 2022-02-25 LAB — MAGNESIUM: Magnesium: 2.1 mg/dL (ref 1.7–2.4)

## 2022-02-25 MED ORDER — WARFARIN SODIUM 2.5 MG PO TABS
2.5000 mg | ORAL_TABLET | Freq: Once | ORAL | Status: AC
  Administered 2022-02-25: 2.5 mg via ORAL
  Filled 2022-02-25: qty 1

## 2022-02-25 NOTE — Progress Notes (Signed)
Physical Therapy Treatment Patient Details Name: Kristin Mitchell MRN: [id number] DOB: Sep 12, 1940 Today's Date: 02/25/2022   History of Present Illness Pt is an 81 y/o F admitted on 02/20/22 after presenting with c/c of palpitations & dyspnea. Pt is being treated for a-fib with RVR. PMH: paroxysmal a-fib, HFpEF, HTN, breast CA, arthritis    PT Comments    Pt was pleasant and motivated to participate during the session and put forth good effort throughout. Pt required no physical assistance during the session and reported no adverse symptoms.  Pt's resting HR was in the upper 90s to low100's with max HR noted after ambulation of 124 bpm.  Pt ambulated with very slow cadence but was steady throughout including during sharp turns and while navigating tight spaces. Pt will benefit from HHPT upon discharge to safely address deficits listed in patient problem list for decreased caregiver assistance and eventual return to PLOF.     Recommendations for follow up therapy are one component of a multi-disciplinary discharge planning process, led by the attending physician.  Recommendations may be updated based on patient status, additional functional criteria and insurance authorization.  Follow Up Recommendations  Home health PT     Assistance Recommended at Discharge Intermittent Supervision/Assistance  Patient can return home with the following A little help with bathing/dressing/bathroom;A little help with walking and/or transfers;Assistance with cooking/housework;Help with stairs or ramp for entrance   Equipment Recommendations  None recommended by PT    Recommendations for Other Services       Precautions / Restrictions Precautions Precautions: Fall Restrictions Weight Bearing Restrictions: No     Mobility  Bed Mobility Overal bed mobility: Modified Independent             General bed mobility comments: Extra time and effort only    Transfers Overall transfer level:  Needs assistance Equipment used: Rolling walker (2 wheels) Transfers: Sit to/from Stand Sit to Stand: Supervision           General transfer comment: Fair eccentric and concentric control with good stability coming to initial stand    Ambulation/Gait Ambulation/Gait assistance: Supervision Gait Distance (Feet): 15 Feet x 2 Assistive device: Rolling walker (2 wheels) Gait Pattern/deviations: Decreased step length - right, Decreased step length - left, Step-through pattern Gait velocity: decreased     General Gait Details: Very slow cadence but steady without LOB   Stairs             Wheelchair Mobility    Modified Rankin (Stroke Patients Only)       Balance Overall balance assessment: No apparent balance deficits (not formally assessed)                                          Cognition Arousal/Alertness: Awake/alert Behavior During Therapy: WFL for tasks assessed/performed Overall Cognitive Status: Within Functional Limits for tasks assessed                                          Exercises Total Joint Exercises Ankle Circles/Pumps: Strengthening, AROM, Both, 10 reps Quad Sets: AROM, Strengthening, Both, 10 reps Gluteal Sets: AROM, Strengthening, Both, 10 reps Hip ABduction/ADduction: Strengthening, Both, 5 reps Long Arc Quad: Strengthening, Both, 10 reps, 15 reps Knee Flexion: Strengthening, Both, 10 reps, 15 reps Other Exercises  Other Exercises: HEP education/review for BLE APs, QS, and GS x 10 every 1-2 hours daily    General Comments        Pertinent Vitals/Pain Pain Assessment Pain Assessment: No/denies pain    Home Living                          Prior Function            PT Goals (current goals can now be found in the care plan section) Progress towards PT goals: Progressing toward goals    Frequency    Min 2X/week      PT Plan Current plan remains appropriate     Co-evaluation              AM-PAC PT "6 Clicks" Mobility   Outcome Measure  Help needed turning from your back to your side while in a flat bed without using bedrails?: None Help needed moving from lying on your back to sitting on the side of a flat bed without using bedrails?: None Help needed moving to and from a bed to a chair (including a wheelchair)?: A Little Help needed standing up from a chair using your arms (e.g., wheelchair or bedside chair)?: A Little Help needed to walk in hospital room?: A Little Help needed climbing 3-5 steps with a railing? : A Little 6 Click Score: 20    End of Session Equipment Utilized During Treatment: Gait belt Activity Tolerance: Patient tolerated treatment well Patient left: in bed;with call bell/phone within reach;with bed alarm set;Other (comment) (pt declined up in chair) Nurse Communication: Mobility status PT Visit Diagnosis: Muscle weakness (generalized) (M62.81);History of falling (Z91.81);Difficulty in walking, not elsewhere classified (R26.2)     Time: 3235-5732 PT Time Calculation (min) (ACUTE ONLY): 29 min  Charges:  $Gait Training: 8-22 mins $Therapeutic Exercise: 8-22 mins                    D. Scott Josselin Gaulin PT, DPT 02/25/22, 11:07 AM

## 2022-02-25 NOTE — Consult Note (Signed)
ANTICOAGULATION CONSULT NOTE   Pharmacy Consult for warfarin Indication: atrial fibrillation  Allergies  Allergen Reactions   Penicillins Rash    Patient Measurements: Height: '5\' 2"'$  (157.5 cm) Weight: 84.7 kg (186 lb 11.7 oz) IBW/kg (Calculated) : 50.1  Vital Signs: Temp: 98 F (36.7 C) (10/18 0800) Temp Source: Oral (10/18 0800) BP: 112/75 (10/18 0800) Pulse Rate: 100 (10/18 0800)  Labs: Recent Labs    02/23/22 0538 02/24/22 0617 02/25/22 0540  LABPROT 29.9* 28.6* 28.7*  INR 2.9* 2.7* 2.7*  CREATININE 1.03* 1.00 0.89     Estimated Creatinine Clearance: 50 mL/min (by C-G formula based on SCr of 0.89 mg/dL).   Medical History: Past Medical History:  Diagnosis Date   A-fib [facility])    Arthritis    Breast cancer ([place]) 11/26/2014   Cancer ([place])    breast   Intermittent palpitations    in early am    Medications:  PTA warfarin 5 mg daily Per discussion with med rec technician who interviewed the patient: pt ran out of 2.5 mg tablets and was instructed by doctor to take the 5 mg tablets. However, latest documentation I can find that specifically references warfarin dosing is from 12/2021 and patient was instructed to take 2.5 mg. Latest dispense was warfarin 5 mg tablets.  Assessment: 81 yo F with PMH paroxysmal atrial fibrillation (CHADSVASc 5, on warfarin), HFpEF, HTN presenting with palpitations and dyspnea. Pt found to be in afib with RVR (HR 110s-120s) which is likely to have also triggered a HF exacerbation. Regarding anticoagulation for afib, pt's INR currently within goal 2-3. See above Medications section for information about PTA warfarin dosing. Per chart review, pt has h/o labile INR and Eliquis has been recommended to her in the past but is difficult for patient to afford.  Drug-drug interactions: Minor inhibitory effect of diltiazem on warfarin could increase INR.  Date Dose INR Comment 10/13 '5mg'$  2.5      10/14 '5mg'$  2.7 (Delta 0.2);  therapeutic 10/15 Zero 3.1 (Delta 0.4); slightly supratherapeutic 10/16 Zero 2.9 Trending down, therapeutic.  10/17 2.'5mg'$  2.7 (Delta -0.2); therapeutic 10/18   2.'5mg'$   2.7      Therapeutic  Goal of Therapy:  INR 2-3   Plan:  INR is therapeutic, trended down w/in goal range.  Will give warfarin 2.5 mg x 1 tonight.  For discharge planning, would recommend 2.'5mg'$  daily for now (pt has '5mg'$  tablets available to split at home).  CTM Daily INR and CBC at least every 3 days.   Aubery Lapping, PharmD Clinical Pharmacist 02/25/2022 8:47 AM

## 2022-02-25 NOTE — Hospital Course (Signed)
Patient is a 81 year old female with history of atrial fibrillation, breast cancer who came to the hospital complaining shortness of breath.  She was found to have atrial fibrillation with RVR.  He also had oxygen saturation 88% on room air, she was giving IV Lasix for exacerbation congestive heart failure.  Echocardiogram showed ejection fraction 35 to 40% with grade 1 diastolic dysfunction.

## 2022-02-25 NOTE — Progress Notes (Signed)
  Progress Note   Patient: Kristin Mitchell GNO:[id number] DOB: June 19, 1940 DOA: 02/20/2022     5 DOS: the patient was seen and examined on 02/25/2022   Brief hospital course: Patient is a 81 year old female with history of atrial fibrillation, breast cancer who came to the hospital complaining shortness of breath.  She was found to have atrial fibrillation with RVR.  He also had oxygen saturation 88% on room air, she was giving IV Lasix for exacerbation congestive heart failure.  Echocardiogram showed ejection fraction 35 to 40% with grade 1 diastolic dysfunction.   Assessment and Plan: Acute hypoxemic respiratory failure secondary to exacerbation congestive heart failure. 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 Patient volume status is improving.  Off oxygen.  Currently on 20 mg IV Lasix daily.  We will continue for another day with normal renal function.  Hyponatremia. Hypokalemia. Hypomagnesemia. Hyponatremia appears to be chronic, magnesium and potassium level has normalized.   Chronic atrial fibrillation with RVR. Heart rate is better controlled.  Continue anticoagulation with warfarin.  Essential hypertension. Continue monitor.      Subjective:  Patient feel much better today, shortness of breath much better.  No cough.  Physical Exam: Vitals:   02/25/22 0800 02/25/22 1147 02/25/22 1222 02/25/22 1537  BP: 112/75 95/65 108/67 (P) 110/81  Pulse: 100 85 87 (P) 99  Resp:   16 (P) 16  Temp: 98 F (36.7 C) 98.7 F (37.1 C) 98.2 F (36.8 C) (P) 97.8 F (36.6 C)  TempSrc: Oral Oral  (P) Oral  SpO2: 97% 100% 98% (P) 99%  Weight:      Height:       General exam: Appears calm and comfortable  Respiratory system: Decreased breath sounds. Respiratory effort normal. Cardiovascular system: Irregular. No JVD, murmurs, rubs, gallops or clicks. No pedal edema. Gastrointestinal system: Abdomen is nondistended, soft and nontender. No organomegaly  or masses felt. Normal bowel sounds heard. Central nervous system: Alert and oriented. No focal neurological deficits. Extremities: Symmetric 5 x 5 power. Skin: No rashes, lesions or ulcers Psychiatry: Judgement and insight appear normal. Mood & affect appropriate. ' Data Reviewed:  Reviewed lab results.  Family Communication:   Disposition: Status is: Inpatient Remains inpatient appropriate because: Severity of disease, IV treatment.  Planned Discharge Destination: Home    Time spent: 35 minutes  Author: Sharen Hones, MD 02/25/2022 4:20 PM  For on call review [email or web address].

## 2022-02-26 DIAGNOSIS — E876 Hypokalemia: Secondary | ICD-10-CM | POA: Diagnosis not present

## 2022-02-26 DIAGNOSIS — I5043 Acute on chronic combined systolic (congestive) and diastolic (congestive) heart failure: Secondary | ICD-10-CM | POA: Diagnosis not present

## 2022-02-26 DIAGNOSIS — I480 Paroxysmal atrial fibrillation: Secondary | ICD-10-CM | POA: Diagnosis not present

## 2022-02-26 LAB — CBC
HCT: 36 % (ref 36.0–46.0)
Hemoglobin: 11.6 g/dL — ABNORMAL LOW (ref 12.0–15.0)
MCH: 30.1 pg (ref 26.0–34.0)
MCHC: 32.2 g/dL (ref 30.0–36.0)
MCV: 93.5 fL (ref 80.0–100.0)
Platelets: 170 10*3/uL (ref 150–400)
RBC: 3.85 MIL/uL — ABNORMAL LOW (ref 3.87–5.11)
RDW: 15.8 % — ABNORMAL HIGH (ref 11.5–15.5)
WBC: 5.8 10*3/uL (ref 4.0–10.5)
nRBC: 0 % (ref 0.0–0.2)

## 2022-02-26 LAB — BASIC METABOLIC PANEL
Anion gap: 5 (ref 5–15)
BUN: 14 mg/dL (ref 8–23)
CO2: 23 mmol/L (ref 22–32)
Calcium: 8 mg/dL — ABNORMAL LOW (ref 8.9–10.3)
Chloride: 100 mmol/L (ref 98–111)
Creatinine, Ser: 0.9 mg/dL (ref 0.44–1.00)
GFR, Estimated: >60 mL/min (ref >60)
Glucose, Bld: 71 mg/dL (ref 70–99)
Potassium: 4.1 mmol/L (ref 3.5–5.1)
Sodium: 128 mmol/L — ABNORMAL LOW (ref 135–145)

## 2022-02-26 LAB — PROTIME-INR
INR: 2.7 — ABNORMAL HIGH (ref 0.8–1.2)
Prothrombin Time: 28.7 seconds — ABNORMAL HIGH (ref 11.4–15.2)

## 2022-02-26 LAB — MAGNESIUM: Magnesium: 2 mg/dL (ref 1.7–2.4)

## 2022-02-26 MED ORDER — METOPROLOL TARTRATE 100 MG PO TABS: 100.0000 mg | ORAL_TABLET | Freq: Two times a day (BID) | ORAL | 0 refills | Status: AC

## 2022-02-26 MED ORDER — TORSEMIDE 20 MG PO TABS: 20.0000 mg | ORAL_TABLET | Freq: Every day | ORAL | 0 refills | Status: AC

## 2022-02-26 MED ORDER — WARFARIN SODIUM 2.5 MG PO TABS
2.5000 mg | ORAL_TABLET | Freq: Every day | ORAL | Status: DC
  Filled 2022-02-26: qty 1

## 2022-02-26 MED ORDER — SODIUM CHLORIDE 1 G PO TABS: 1.0000 g | ORAL_TABLET | Freq: Every day | ORAL | Status: AC

## 2022-02-26 NOTE — TOC Transition Note (Signed)
Transition of Care [facility]) - CM/SW Discharge Note   Patient Details  Name: Kristin Mitchell MRN: [id number] Date of Birth: [birth year]  [facility] CM/SW Contact:  Candie Chroman, LCSW Phone Number: 02/26/2022, 10:36 AM   Clinical Narrative:   Patient has orders to discharge home today. She confirmed she is still not interested in home health services. No further concerns. CSW signing off.  Final next level of care: Home/Self Care Barriers to Discharge: Barriers Resolved   Patient Goals and CMS Choice        Discharge Placement                Patient to be transferred to facility by: Husband   Patient and family notified of of transfer: 02/26/22  Discharge Plan and Services                                     Social Determinants of Health (SDOH) Interventions     Readmission Risk Interventions     No data to display

## 2022-02-26 NOTE — Discharge Summary (Signed)
Physician Discharge Summary   Patient: Kristin Mitchell MRN: [id number] DOB: 02-Dec-1940  Admit date:     02/20/2022  Discharge date: 02/26/22  Discharge Physician: Sharen Hones   PCP: Latanya Maudlin, NP   Recommendations at discharge:   Follow-up with PCP in 1 week. Follow-up with Dr. Jose Persia in 2 weeks. Fluid striction 1500 mL/day.  Discharge Diagnoses: Principal Problem:   Paroxysmal atrial fibrillation with RVR (HCC) Active Problems:   Acute on chronic combined systolic and diastolic CHF (congestive heart failure) (HCC)   Hypokalemia   Hypomagnesemia   Essential hypertension   CKD (chronic kidney disease) stage 2, GFR 60-89 ml/min   Breast cancer of upper-outer quadrant of right female breast (HCC)   Hyponatremia   Obesity (BMI 30-39.9)   PVD (peripheral vascular disease) ([place])  Resolved Problems:   * No resolved hospital problems. [facility] Course: Patient is a 81 year old female with history of atrial fibrillation, breast cancer who came to the hospital complaining shortness of breath.  She was found to have atrial fibrillation with RVR.  He also had oxygen saturation 88% on room air, she was giving IV Lasix for exacerbation congestive heart failure.  Echocardiogram showed ejection fraction 35 to 40% with grade 1 diastolic dysfunction.   Assessment and Plan: Acute hypoxemic respiratory failure secondary to exacerbation congestive heart failure. 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 Condition has improved, has been followed by cardiology.  Currently medically stable to be discharged.  Beta-blocker had increased to 100 mg twice a day per cardiology, also increased home dose of torsemide 20 mg daily.  Patient be followed up with cardiology in 2 weeks, PCP in 1 week.   Hyponatremia. Hypokalemia. Hypomagnesemia. Hyponatremia appears to be chronic, magnesium and potassium level has normalized. She is chronically on salt tablets 2 g  a day, I will reduce to 1 g due to CHF.  Patient also advised to fluid restriction with 1500 mm/day.  Follow-up with BMP with PCP.     Chronic atrial fibrillation with RVR. Heart rate is better controlled.  Continue anticoagulation with warfarin.  She has been taking 2.5 mg daily with good INR.  Follow-up with PCP.   Essential hypertension. Resume home treatment.       Consultants: Cardiology Procedures performed: None  Disposition: Home health Diet recommendation:  Discharge Diet Orders (From admission, onward)     Start     Ordered   02/26/22 0000  Diet general       Comments: Low fat diet with fluid 1521m/day   02/26/22 1003           Regular diet DISCHARGE MEDICATION: Allergies as of 02/26/2022       Reactions   Penicillins Rash        Medication List     STOP taking these medications    metoprolol succinate 50 MG 24 hr tablet Commonly known as: TOPROL-XL       TAKE these medications    acetaminophen 500 MG tablet Commonly known as: TYLENOL Take 500 mg by mouth every 6 (six) hours as needed.   alendronate 70 MG tablet Commonly known as: FOSAMAX Take with a full glass of water on an empty stomach. What changed: additional instructions   atorvastatin 80 MG tablet Commonly known as: LIPITOR Take 1 tablet (80 mg total) by mouth daily.   Calcium 600+D 600-20 MG-MCG Tabs Generic drug: Calcium Carb-Cholecalciferol Take 1 tablet by mouth daily.   cyanocobalamin 1000 MCG tablet Commonly  known as: VITAMIN B12 Take 1,000 mcg by mouth daily.   metoprolol tartrate 100 MG tablet Commonly known as: LOPRESSOR Take 1 tablet (100 mg total) by mouth 2 (two) times daily.   sodium chloride 1 g tablet Take 1 tablet (1 g total) by mouth daily. What changed: when to take this   torsemide 20 MG tablet Commonly known as: DEMADEX Take 1 tablet (20 mg total) by mouth daily. What changed:  medication strength how much to take   Vitamin D3 50 MCG (2000  UT) Tabs Take 1 tablet by mouth daily.   warfarin 2.5 MG tablet Commonly known as: Coumadin Take 1 tablet (2.5 mg total) by mouth daily. What changed: Another medication with the same name was removed. Continue taking this medication, and follow the directions you see here.        Follow-up Information     Latanya Maudlin, NP Follow up in 1 week(s).   Specialty: Family Medicine Contact information: [place] [address] [phone]         Corey Skains, MD Follow up in 2 week(s).   Specialty: Cardiology Contact information: [address] [place] [facility] [place] [address] [phone]                Discharge Exam: Danley Danker Weights   02/20/22 3300 02/21/22 1854 02/22/22 0115  Weight: 88 kg 87.2 kg 84.7 kg   General exam: Appears calm and comfortable  Respiratory system: Clear to auscultation. Respiratory effort normal. Cardiovascular system: Irregular. No JVD, murmurs, rubs, gallops or clicks. No pedal edema. Gastrointestinal system: Abdomen is nondistended, soft and nontender. No organomegaly or masses felt. Normal bowel sounds heard. Central nervous system: Alert and oriented x3. No focal neurological deficits. Extremities: Symmetric 5 x 5 power. Skin: No rashes, lesions or ulcers Psychiatry: Mood & affect appropriate.    Condition at discharge: good  The results of significant diagnostics from this hospitalization (including imaging, microbiology, ancillary and laboratory) are listed below for reference.   Imaging Studies: ECHOCARDIOGRAM COMPLETE  Result Date: 02/22/2022    ECHOCARDIOGRAM REPORT   Patient Name:   Kristin Mitchell Date of Exam: 02/22/2022 Medical Rec #:  [id number]             Height:       62.0 in Accession #:    [id number]            Weight:       186.7 lb Date of Birth:  02-19-1941              BSA:          1.857 m Patient Age:    37 years              BP:           112/98 mmHg  Patient Gender: F                     HR:           94 bpm. Exam Location:  [facility] Procedure: 2D Echo Indications:     CHF I50.31  History:         Patient has prior history of Echocardiogram examinations, most                  recent 12/29/2021.  Sonographer:     Kathlen Brunswick RDCS Referring Phys:  [facility] TDSK Diagnosing Phys: Yolonda Kida MD  IMPRESSIONS  1. Left ventricular ejection fraction, by estimation, is 35 to 40%. The left ventricle has moderately decreased function. The left ventricle demonstrates global hypokinesis. Left ventricular diastolic parameters are consistent with Grade I diastolic dysfunction (impaired relaxation).  2. Right ventricular systolic function is normal. The right ventricular size is normal.  3. The mitral valve is normal in structure. Moderate to severe mitral valve regurgitation.  4. Tricuspid valve regurgitation is moderate.  5. The aortic valve is normal in structure. Aortic valve regurgitation is not visualized. FINDINGS  Left Ventricle: Left ventricular ejection fraction, by estimation, is 35 to 40%. The left ventricle has moderately decreased function. The left ventricle demonstrates global hypokinesis. The left ventricular internal cavity size was normal in size. There is no left ventricular hypertrophy. Left ventricular diastolic parameters are consistent with Grade I diastolic dysfunction (impaired relaxation). Right Ventricle: The right ventricular size is normal. No increase in right ventricular wall thickness. Right ventricular systolic function is normal. Left Atrium: Left atrial size was normal in size. Right Atrium: Right atrial size was normal in size. Pericardium: There is no evidence of pericardial effusion. Mitral Valve: The mitral valve is normal in structure. Moderate to severe mitral valve regurgitation. Tricuspid Valve: The tricuspid valve is normal in structure. Tricuspid valve regurgitation is moderate. Aortic Valve: The aortic valve is  normal in structure. Aortic valve regurgitation is not visualized. Aortic valve peak gradient measures 7.6 mmHg. Pulmonic Valve: The pulmonic valve was normal in structure. Pulmonic valve regurgitation is mild. Aorta: The ascending aorta was not well visualized. IAS/Shunts: No atrial level shunt detected by color flow Doppler.  LEFT VENTRICLE PLAX 2D LVIDd:         4.60 cm     Diastology LVIDs:         3.50 cm     LV e' medial:    7.62 cm/s LV PW:         1.10 cm     LV E/e' medial:  15.5 LV IVS:        1.30 cm     LV e' lateral:   12.75 cm/s LVOT diam:     1.80 cm     LV E/e' lateral: 9.3 LV SV:         31 LV SV Index:   17 LVOT Area:     2.54 cm  LV Volumes (MOD) LV vol d, MOD A2C: 52.7 ml LV vol d, MOD A4C: 51.2 ml LV vol s, MOD A2C: 32.3 ml LV vol s, MOD A4C: 28.6 ml LV SV MOD A2C:     20.4 ml LV SV MOD A4C:     51.2 ml LV SV MOD BP:      21.4 ml RIGHT VENTRICLE RV Basal diam:  2.80 cm RV S prime:     7.24 cm/s TAPSE (M-mode): 1.6 cm LEFT ATRIUM           Index        RIGHT ATRIUM           Index LA diam:      4.30 cm 2.32 cm/m   RA Area:     14.90 cm LA Vol (A4C): 49.6 ml 26.71 ml/m  RA Volume:   34.90 ml  18.80 ml/m  AORTIC VALVE                 PULMONIC VALVE AV Area (Vmax): 1.23 cm     PV Vmax:  1.09 m/s AV Vmax:        138.25 cm/s  PV Peak grad:     4.8 mmHg AV Peak Grad:   7.6 mmHg     PR End Diast Vel: 7.84 msec LVOT Vmax:      66.67 cm/s LVOT Vmean:     44.000 cm/s LVOT VTI:       0.121 m  AORTA Ao Root diam: 3.10 cm Ao Asc diam:  2.80 cm MITRAL VALVE                  TRICUSPID VALVE MV Area (PHT): 3.63 cm       TV Peak grad:   27.8 mmHg MV Decel Time: 209 msec       TV Vmax:        2.64 m/s MR Peak grad:    106.1 mmHg MR Mean grad:    62.0 mmHg    SHUNTS MR Vmax:         515.00 cm/s  Systemic VTI:  0.12 m MR Vmean:        365.0 cm/s   Systemic Diam: 1.80 cm MR PISA:         1.01 cm MR PISA Eff ROA: 6 mm MR PISA Radius:  0.40 cm MV E velocity: 118.00 cm/s Yolonda Kida MD  Electronically signed by Yolonda Kida MD Signature Date/Time: 02/22/2022/9:53:14 AM    Final    DG Chest 2 View  Result Date: 02/20/2022 CLINICAL DATA:  Palpitations and shortness of breath.  Tachycardia. EXAM: CHEST - 2 VIEW COMPARISON:  One-view chest x-ray 01/15/2022 FINDINGS: The heart is enlarged, exaggerated by low lung volumes. Right perihilar and left basilar airspace opacities are present. Left greater than right pleural effusions are present. Mild interstitial edema is noted. Surgical clips are present in the axillary regions bilaterally. Remote right acromial fracture noted. IMPRESSION: 1. Cardiomegaly with mild interstitial edema and bilateral pleural effusions compatible with congestive heart failure. 2. Right perihilar and left basilar airspace disease likely reflects atelectasis. Electronically Signed   By: San Morelle M.D.   On: 02/20/2022 07:08   US Abdomen Limited RUQ (LIVER/GB)  Result Date: 02/12/2022 CLINICAL DATA:  Abnormal LFTs.  Prior cholecystectomy. EXAM: ULTRASOUND ABDOMEN LIMITED RIGHT UPPER QUADRANT COMPARISON:  Ultrasound abdomen January 15, 2022; MRI abdomen December 19, 2021 FINDINGS: Gallbladder: Surgically absent Common bile duct: Diameter: 4 mm Liver: No focal lesion identified. Within normal limits in parenchymal echogenicity. Portal vein is patent on color Doppler imaging with normal direction of blood flow towards the liver. Other: Redemonstrated 3.7 x 3.1 x 3.6 cm exophytic mass off the superior pole of the right kidney, previously measuring 3.7 x 3.6 x 2.7 cm. Right pleural effusion. IMPRESSION: 1. Redemonstrated exophytic mass off the superior pole of the right kidney. This was better evaluated on MRI abdomen December 20, 2021. Recommend follow-up as per report on that examination (MRI abdomen with and without contrast 6 months from prior MR abdomen). 2. Prior cholecystectomy. 3. Right pleural effusion. Electronically Signed   By: Lovey Newcomer M.D.   On:  02/12/2022 10:03    Microbiology: Results for orders placed or performed during the hospital encounter of 01/15/22  SARS Coronavirus 2 by RT PCR (hospital order, performed in [facility] hospital lab) *cepheid single result test* Anterior Nasal Swab     Status: None   Collection Time: 01/15/22  5:18 AM   Specimen: Anterior Nasal Swab  Result Value Ref Range Status  SARS Coronavirus 2 by RT PCR NEGATIVE NEGATIVE Final    Comment: (NOTE) SARS-CoV-2 target nucleic acids are NOT DETECTED.  The SARS-CoV-2 RNA is generally detectable in upper and lower respiratory specimens during the acute phase of infection. The lowest concentration of SARS-CoV-2 viral copies this assay can detect is 250 copies / mL. A negative result does not preclude SARS-CoV-2 infection and should not be used as the sole basis for treatment or other patient management decisions.  A negative result may occur with improper specimen collection / handling, submission of specimen other than nasopharyngeal swab, presence of viral mutation(s) within the areas targeted by this assay, and inadequate number of viral copies (<250 copies / mL). A negative result must be combined with clinical observations, patient history, and epidemiological information.  Fact Sheet for Patients:   https://www.patel.info/  Fact Sheet for Healthcare Providers: https://hall.com/  This test is not yet approved or  cleared by the [place] FDA and has been authorized for detection and/or diagnosis of SARS-CoV-2 by FDA under an Emergency Use Authorization (EUA).  This EUA will remain in effect (meaning this test can be used) for the duration of the COVID-19 declaration under Section 564(b)(1) of the Act, 21 U.S.C. section 360bbb-3(b)(1), unless the authorization is terminated or revoked sooner.  Performed at [facility], [address] [place], [place] [address]   Urine Culture      Status: Abnormal   Collection Time: 01/15/22  5:18 AM   Specimen: Urine, Clean Catch  Result Value Ref Range Status   Specimen Description   Final    URINE, CLEAN CATCH Performed at [facility], [address] [place], [place] [address]    Special Requests   Final    NONE Performed at [facility], [place],  [address]    Culture >=100,000 COLONIES/mL ESCHERICHIA COLI (A)  Final   Report Status 01/17/2022 FINAL  Final   Organism ID, Bacteria ESCHERICHIA COLI (A)  Final      Susceptibility   Escherichia coli - MIC*    AMPICILLIN <=2 SENSITIVE Sensitive     CEFAZOLIN <=4 SENSITIVE Sensitive     CEFEPIME <=0.12 SENSITIVE Sensitive     CEFTRIAXONE <=0.25 SENSITIVE Sensitive     CIPROFLOXACIN <=0.25 SENSITIVE Sensitive     GENTAMICIN <=1 SENSITIVE Sensitive     IMIPENEM <=0.25 SENSITIVE Sensitive     NITROFURANTOIN <=16 SENSITIVE Sensitive     TRIMETH/SULFA <=20 SENSITIVE Sensitive     AMPICILLIN/SULBACTAM <=2 SENSITIVE Sensitive     PIP/TAZO <=4 SENSITIVE Sensitive     * >=100,000 COLONIES/mL ESCHERICHIA COLI    Labs: CBC: Recent Labs  Lab 02/20/22 0652 02/26/22 0443  WBC 5.4 5.8  NEUTROABS 4.0  --   HGB 10.4* 11.6*  HCT 33.4* 36.0  MCV 96.0 93.5  PLT 151 676   Basic Metabolic Panel: Recent Labs  Lab 02/20/22 0850 02/21/22 0403 02/21/22 0412 02/22/22 0508 02/22/22 1359 02/23/22 0538 02/24/22 0617 02/25/22 0540 02/26/22 0443  NA  --    < >  --    < > 124* 129* 132* 130* 128*  K  --    < >  --    < > 3.7 3.7 4.3 3.9 4.1  CL  --    < >  --    < > 94* 98 104 101 100  CO2  --    < >  --    < > 24 24 21* 20* 23  GLUCOSE  --    < >  --    < > 117* 86 86 82 71  BUN  --    < >  --    < > '22 22 20 17 14  '$ CREATININE  --    < >  --    < > 1.18* 1.03* 1.00 0.89 0.90  CALCIUM  --    < >  --    < > 8.1* 8.6* 8.4* 8.2* 8.0*  MG 1.4*  --  1.8  --   --   --  1.8 2.1 2.0   < > = values in this interval not displayed.   Liver  Function Tests: Recent Labs  Lab 02/20/22 0652  AST 67*  ALT 44  ALKPHOS 107  BILITOT 1.1  PROT 5.8*  ALBUMIN 3.0*   CBG: No results for input(s): "GLUCAP" in the last 168 hours.  Discharge time spent: greater than 30 minutes.  Signed: Sharen Hones, MD [facility] 02/26/2022

## 2022-02-26 NOTE — Consult Note (Signed)
[facility] for warfarin Indication: atrial fibrillation  Allergies  Allergen Reactions   Penicillins Rash    Patient Measurements: Height: '5\' 2"'$  (157.5 cm) Weight: 84.7 kg (186 lb 11.7 oz) IBW/kg (Calculated) : 50.1  Vital Signs: Temp: 98.2 F (36.8 C) (10/19 0832) BP: 115/90 (10/19 0832) Pulse Rate: 76 (10/19 0832)  Labs: Recent Labs    02/24/22 0617 02/25/22 0540 02/26/22 0443  HGB  --   --  11.6*  HCT  --   --  36.0  PLT  --   --  170  LABPROT 28.6* 28.7* 28.7*  INR 2.7* 2.7* 2.7*  CREATININE 1.00 0.89 0.90     Estimated Creatinine Clearance: 49.5 mL/min (by C-G formula based on SCr of 0.9 mg/dL).   Medical History: Past Medical History:  Diagnosis Date   A-fib [facility])    Arthritis    Breast cancer ([place]) 11/26/2014   Cancer ([place])    breast   Intermittent palpitations    in early am    Medications:  PTA warfarin 5 mg daily Per discussion with med rec technician who interviewed the patient: pt ran out of 2.5 mg tablets and was instructed by doctor to take the 5 mg tablets. However, latest documentation I can find that specifically references warfarin dosing is from 12/2021 and patient was instructed to take 2.5 mg. Latest dispense was warfarin 5 mg tablets.  Assessment: 81 yo F with PMH paroxysmal atrial fibrillation (CHADSVASc 5, on warfarin), HFpEF, HTN presenting with palpitations and dyspnea. Pt found to be in afib with RVR (HR 110s-120s) which is likely to have also triggered a HF exacerbation. Regarding anticoagulation for afib, pt's INR currently within goal 2-3. See above Medications section for information about PTA warfarin dosing. Per chart review, pt has h/o labile INR and Eliquis has been recommended to her in the past but is difficult for patient to afford.  Drug-drug interactions: Minor inhibitory effect of diltiazem on warfarin could increase  INR.  Date Dose INR Comment 10/13 '5mg'$  2.5      10/14 '5mg'$  2.7 (Delta 0.2); therapeutic 10/15 Zero 3.1 (Delta 0.4); slightly supratherapeutic 10/16 Zero 2.9 Trending down, therapeutic.  10/17 2.'5mg'$  2.7 (Delta -0.2); therapeutic 10/18   2.'5mg'$   2.7      Therapeutic 10/19 2.'5mg'$  2.7 Thera  Goal of Therapy:  INR 2-3   Plan:  INR is therapeutic x3 and stable.  Will start scheduled warfarin 2.5 mg daily. For discharge planning, would recommend 2.'5mg'$  daily for now (pt has '5mg'$  tablets available to split at home.).  CTM Daily INR and CBC at least every 3 days.   Lorna Dibble, PharmD, [facility] Clinical Pharmacist 02/26/2022 9:19 AM

## 2022-03-28 ENCOUNTER — Emergency Department: Payer: Medicare Other

## 2022-03-28 ENCOUNTER — Observation Stay: Payer: Medicare Other

## 2022-03-28 ENCOUNTER — Other Ambulatory Visit: Payer: Self-pay

## 2022-03-28 ENCOUNTER — Observation Stay
Admission: EM | Admit: 2022-03-28 | Discharge: 2022-03-29 | Disposition: A | Discharge status: Home / Self Care | Payer: Medicare Other | Attending: Internal Medicine | Admitting: Internal Medicine

## 2022-03-28 DIAGNOSIS — M24411 Recurrent dislocation, right shoulder: Secondary | ICD-10-CM | POA: Diagnosis not present

## 2022-03-28 DIAGNOSIS — E669 Obesity, unspecified: Secondary | ICD-10-CM | POA: Diagnosis not present

## 2022-03-28 DIAGNOSIS — R569 Unspecified convulsions: Secondary | ICD-10-CM

## 2022-03-28 DIAGNOSIS — M4312 Spondylolisthesis, cervical region: Secondary | ICD-10-CM | POA: Diagnosis not present

## 2022-03-28 DIAGNOSIS — Z79899 Other long term (current) drug therapy: Secondary | ICD-10-CM | POA: Diagnosis not present

## 2022-03-28 DIAGNOSIS — Z6834 Body mass index (BMI) 34.0-34.9, adult: Secondary | ICD-10-CM | POA: Insufficient documentation

## 2022-03-28 DIAGNOSIS — E7800 Pure hypercholesterolemia, unspecified: Secondary | ICD-10-CM | POA: Diagnosis not present

## 2022-03-28 DIAGNOSIS — I4891 Unspecified atrial fibrillation: Secondary | ICD-10-CM | POA: Diagnosis not present

## 2022-03-28 DIAGNOSIS — E876 Hypokalemia: Secondary | ICD-10-CM | POA: Diagnosis present

## 2022-03-28 DIAGNOSIS — R55 Syncope and collapse: Secondary | ICD-10-CM | POA: Diagnosis present

## 2022-03-28 DIAGNOSIS — N1831 Chronic kidney disease, stage 3a: Secondary | ICD-10-CM | POA: Diagnosis present

## 2022-03-28 DIAGNOSIS — Z7901 Long term (current) use of anticoagulants: Secondary | ICD-10-CM | POA: Insufficient documentation

## 2022-03-28 DIAGNOSIS — I5022 Chronic systolic (congestive) heart failure: Secondary | ICD-10-CM | POA: Diagnosis not present

## 2022-03-28 DIAGNOSIS — I482 Chronic atrial fibrillation, unspecified: Principal | ICD-10-CM | POA: Insufficient documentation

## 2022-03-28 DIAGNOSIS — G459 Transient cerebral ischemic attack, unspecified: Secondary | ICD-10-CM | POA: Diagnosis not present

## 2022-03-28 DIAGNOSIS — S43004A Unspecified dislocation of right shoulder joint, initial encounter: Secondary | ICD-10-CM | POA: Diagnosis present

## 2022-03-28 DIAGNOSIS — I10 Essential (primary) hypertension: Secondary | ICD-10-CM | POA: Diagnosis present

## 2022-03-28 DIAGNOSIS — S43011A Anterior subluxation of right humerus, initial encounter: Secondary | ICD-10-CM

## 2022-03-28 DIAGNOSIS — I130 Hypertensive heart and chronic kidney disease with heart failure and stage 1 through stage 4 chronic kidney disease, or unspecified chronic kidney disease: Secondary | ICD-10-CM | POA: Insufficient documentation

## 2022-03-28 LAB — PHOSPHORUS: Phosphorus: 3.4 mg/dL (ref 2.5–4.6)

## 2022-03-28 LAB — PROTIME-INR
INR: 2.4 — ABNORMAL HIGH (ref 0.8–1.2)
Prothrombin Time: 26.1 seconds — ABNORMAL HIGH (ref 11.4–15.2)

## 2022-03-28 LAB — URINALYSIS, ROUTINE W REFLEX MICROSCOPIC
Bilirubin Urine: NEGATIVE
Glucose, UA: NEGATIVE mg/dL
Hgb urine dipstick: NEGATIVE
Ketones, ur: NEGATIVE mg/dL
Leukocytes,Ua: NEGATIVE
Nitrite: NEGATIVE
Protein, ur: NEGATIVE mg/dL
Specific Gravity, Urine: 1.008 (ref 1.005–1.030)
pH: 7 (ref 5.0–8.0)

## 2022-03-28 LAB — COMPREHENSIVE METABOLIC PANEL
ALT: 22 U/L (ref 0–44)
AST: 33 U/L (ref 15–41)
Albumin: 3.5 g/dL (ref 3.5–5.0)
Alkaline Phosphatase: 115 U/L (ref 38–126)
Anion gap: 14 (ref 5–15)
BUN: 30 mg/dL — ABNORMAL HIGH (ref 8–23)
CO2: 26 mmol/L (ref 22–32)
Calcium: 9.4 mg/dL (ref 8.9–10.3)
Chloride: 96 mmol/L — ABNORMAL LOW (ref 98–111)
Creatinine, Ser: 1.46 mg/dL — ABNORMAL HIGH (ref 0.44–1.00)
GFR, Estimated: 36 mL/min — ABNORMAL LOW (ref >60)
Glucose, Bld: 109 mg/dL — ABNORMAL HIGH (ref 70–99)
Potassium: 3.2 mmol/L — ABNORMAL LOW (ref 3.5–5.1)
Sodium: 136 mmol/L (ref 135–145)
Total Bilirubin: 1.1 mg/dL (ref 0.3–1.2)
Total Protein: 7.1 g/dL (ref 6.5–8.1)

## 2022-03-28 LAB — CBC WITH DIFFERENTIAL/PLATELET
Abs Immature Granulocytes: 0.03 10*3/uL (ref 0.00–0.07)
Basophils Absolute: 0 10*3/uL (ref 0.0–0.1)
Basophils Relative: 1 %
Eosinophils Absolute: 0.2 10*3/uL (ref 0.0–0.5)
Eosinophils Relative: 3 %
HCT: 38.9 % (ref 36.0–46.0)
Hemoglobin: 12.8 g/dL (ref 12.0–15.0)
Immature Granulocytes: 1 %
Lymphocytes Relative: 15 %
Lymphs Abs: 0.9 10*3/uL (ref 0.7–4.0)
MCH: 29.6 pg (ref 26.0–34.0)
MCHC: 32.9 g/dL (ref 30.0–36.0)
MCV: 90 fL (ref 80.0–100.0)
Monocytes Absolute: 0.5 10*3/uL (ref 0.1–1.0)
Monocytes Relative: 9 %
Neutro Abs: 4.1 10*3/uL (ref 1.7–7.7)
Neutrophils Relative %: 71 %
Platelets: 160 10*3/uL (ref 150–400)
RBC: 4.32 MIL/uL (ref 3.87–5.11)
RDW: 14.5 % (ref 11.5–15.5)
WBC: 5.8 10*3/uL (ref 4.0–10.5)
nRBC: 0 % (ref 0.0–0.2)

## 2022-03-28 LAB — MAGNESIUM: Magnesium: 1.4 mg/dL — ABNORMAL LOW (ref 1.7–2.4)

## 2022-03-28 LAB — LACTIC ACID, PLASMA
Lactic Acid, Venous: 3 mmol/L (ref 0.5–1.9)
Lactic Acid, Venous: 1.8 mmol/L (ref 0.5–1.9)

## 2022-03-28 LAB — TSH: TSH: 2.041 u[IU]/mL (ref 0.350–4.500)

## 2022-03-28 LAB — BRAIN NATRIURETIC PEPTIDE: B Natriuretic Peptide: 520.2 pg/mL — ABNORMAL HIGH (ref 0.0–100.0)

## 2022-03-28 MED ORDER — OXYCODONE-ACETAMINOPHEN 5-325 MG PO TABS: 1.0000 | ORAL_TABLET | ORAL | Status: DC | PRN

## 2022-03-28 MED ORDER — VITAMIN D 25 MCG (1000 UNIT) PO TABS
2000.0000 [IU] | ORAL_TABLET | Freq: Every day | ORAL | Status: DC
  Administered 2022-03-29: 2000 [IU] via ORAL
  Filled 2022-03-28: qty 2

## 2022-03-28 MED ORDER — OYSTER SHELL CALCIUM/D3 500-5 MG-MCG PO TABS
1.0000 | ORAL_TABLET | Freq: Every day | ORAL | Status: DC
  Administered 2022-03-29: 1 via ORAL
  Filled 2022-03-28 (×2): qty 1

## 2022-03-28 MED ORDER — POTASSIUM CHLORIDE 10 MEQ/100ML IV SOLN
10.0000 meq | INTRAVENOUS | Status: AC
  Administered 2022-03-28 (×2): 10 meq via INTRAVENOUS
  Filled 2022-03-28 (×2): qty 100

## 2022-03-28 MED ORDER — POTASSIUM CHLORIDE CRYS ER 20 MEQ PO TBCR
40.0000 meq | EXTENDED_RELEASE_TABLET | Freq: Once | ORAL | Status: AC
  Administered 2022-03-28: 40 meq via ORAL
  Filled 2022-03-28: qty 2

## 2022-03-28 MED ORDER — VITAMIN B-12 1000 MCG PO TABS
1000.0000 ug | ORAL_TABLET | Freq: Every day | ORAL | Status: DC
  Administered 2022-03-28 – 2022-03-29 (×2): 1000 ug via ORAL
  Filled 2022-03-28: qty 2
  Filled 2022-03-28: qty 1

## 2022-03-28 MED ORDER — POTASSIUM CHLORIDE CRYS ER 20 MEQ PO TBCR
40.0000 meq | EXTENDED_RELEASE_TABLET | Freq: Once | ORAL | Status: DC
  Filled 2022-03-28: qty 2

## 2022-03-28 MED ORDER — TORSEMIDE 20 MG PO TABS
20.0000 mg | ORAL_TABLET | Freq: Every day | ORAL | Status: DC
  Administered 2022-03-28: 20 mg via ORAL
  Filled 2022-03-28 (×2): qty 1

## 2022-03-28 MED ORDER — DILTIAZEM HCL 25 MG/5ML IV SOLN
10.0000 mg | Freq: Once | INTRAVENOUS | Status: AC
  Administered 2022-03-28: 10 mg via INTRAVENOUS
  Filled 2022-03-28: qty 5

## 2022-03-28 MED ORDER — LORAZEPAM 2 MG/ML IJ SOLN: 1.0000 mg | INTRAMUSCULAR | Status: DC | PRN

## 2022-03-28 MED ORDER — SODIUM CHLORIDE 0.9 % IV BOLUS
500.0000 mL | Freq: Once | INTRAVENOUS | Status: AC
  Administered 2022-03-28: 500 mL via INTRAVENOUS

## 2022-03-28 MED ORDER — METOPROLOL TARTRATE 50 MG PO TABS
100.0000 mg | ORAL_TABLET | Freq: Two times a day (BID) | ORAL | Status: DC
  Administered 2022-03-28 – 2022-03-29 (×3): 100 mg via ORAL
  Filled 2022-03-28 (×3): qty 2

## 2022-03-28 MED ORDER — HYDRALAZINE HCL 20 MG/ML IJ SOLN: 5.0000 mg | INTRAMUSCULAR | Status: DC | PRN

## 2022-03-28 MED ORDER — MAGNESIUM SULFATE 2 GM/50ML IV SOLN
2.0000 g | Freq: Once | INTRAVENOUS | Status: AC
  Administered 2022-03-28: 2 g via INTRAVENOUS
  Filled 2022-03-28: qty 50

## 2022-03-28 MED ORDER — ACETAMINOPHEN 325 MG PO TABS: 650.0000 mg | ORAL_TABLET | Freq: Four times a day (QID) | ORAL | Status: DC | PRN

## 2022-03-28 MED ORDER — SODIUM CHLORIDE 1 G PO TABS
1.0000 g | ORAL_TABLET | Freq: Every day | ORAL | Status: DC
  Administered 2022-03-28 – 2022-03-29 (×2): 1 g via ORAL
  Filled 2022-03-28 (×2): qty 1

## 2022-03-28 MED ORDER — METOPROLOL TARTRATE 5 MG/5ML IV SOLN: 5.0000 mg | INTRAVENOUS | Status: DC | PRN

## 2022-03-28 MED ORDER — WARFARIN SODIUM 2.5 MG PO TABS
2.5000 mg | ORAL_TABLET | Freq: Once | ORAL | Status: AC
  Administered 2022-03-28: 2.5 mg via ORAL
  Filled 2022-03-28: qty 1

## 2022-03-28 MED ORDER — POTASSIUM CHLORIDE 10 MEQ/100ML IV SOLN
10.0000 meq | Freq: Once | INTRAVENOUS | Status: AC
  Administered 2022-03-28: 10 meq via INTRAVENOUS
  Filled 2022-03-28: qty 100

## 2022-03-28 MED ORDER — ATORVASTATIN CALCIUM 80 MG PO TABS
80.0000 mg | ORAL_TABLET | Freq: Every day | ORAL | Status: DC
  Administered 2022-03-28 – 2022-03-29 (×2): 80 mg via ORAL
  Filled 2022-03-28: qty 4
  Filled 2022-03-28: qty 1

## 2022-03-28 MED ORDER — LEVETIRACETAM IN NACL 1000 MG/100ML IV SOLN
1000.0000 mg | Freq: Once | INTRAVENOUS | Status: AC
  Administered 2022-03-28: 1000 mg via INTRAVENOUS
  Filled 2022-03-28: qty 100

## 2022-03-28 MED ORDER — ONDANSETRON HCL 4 MG/2ML IJ SOLN: 4.0000 mg | Freq: Three times a day (TID) | INTRAMUSCULAR | Status: DC | PRN

## 2022-03-28 MED ORDER — WARFARIN - PHARMACIST DOSING INPATIENT
Freq: Every day | Status: DC
  Filled 2022-03-28: qty 1

## 2022-03-28 NOTE — ED Provider Notes (Signed)
[facility] Provider Note    Event Date/Time   First MD Initiated Contact with Patient 03/28/22 1134     (approximate)   History   Seizures and Irregular Heart Beat   HPI  Kristin Mitchell is a 81 y.o. female here with irregular heartbeat and seizure-like activity.  History provided by EMS and the patient's husband.  Per report, the patient was walking with her walker today when she abruptly lost consciousness.  She was noted to have full body convulsions and reportedly was postictal with EMS.  She does not recall any of these episodes.  She does states she feels generally fatigued.  Denies any recent illness.  Denies any recent headaches.  No focal numbness or weakness.  No other trauma.  No other complaints.  Medication changes.     Physical Exam   Triage Vital Signs: ED Triage Vitals [03/28/22 1131]  Enc Vitals Group     BP (!) 133/96     Pulse Rate (!) 134     Resp 20     Temp 98.3 F (36.8 C)     Temp Source Oral     SpO2 99 %     Weight      Height      Head Circumference      Peak Flow      Pain Score      Pain Loc      Pain Edu?      Excl. in [place]?     Most recent vital signs: Vitals:   03/28/22 1830 03/28/22 2142  BP: 105/70 109/71  Pulse: 80 80  Resp: 20 18  Temp:  98 F (36.7 C)  SpO2: 99% 100%     General: Awake, no distress.   CV:  Good peripheral perfusion.  Tachycardic, irregularly irregular. Resp:  Normal effort.  Tachypneic. Abd:  No distention.  Other:  Oropharynx with superficial laceration to the left lateral tongue.  No active bleeding.  Cranial nerves II through XII intact.  Strength out of 5 bilateral upper and lower extremities.  Normal sensation to light touch.   ED Results / Procedures / Treatments   Labs (all labs ordered are listed, but only abnormal results are displayed) Labs Reviewed  COMPREHENSIVE METABOLIC PANEL - Abnormal; Notable for the following components:      Result Value    Potassium 3.2 (*)    Chloride 96 (*)    Glucose, Bld 109 (*)    BUN 30 (*)    Creatinine, Ser 1.46 (*)    GFR, Estimated 36 (*)    All other components within normal limits  LACTIC ACID, PLASMA - Abnormal; Notable for the following components:   Lactic Acid, Venous 3.0 (*)    All other components within normal limits  BRAIN NATRIURETIC PEPTIDE - Abnormal; Notable for the following components:   B Natriuretic Peptide 520.2 (*)    All other components within normal limits  PROTIME-INR - Abnormal; Notable for the following components:   Prothrombin Time 26.1 (*)    INR 2.4 (*)    All other components within normal limits  MAGNESIUM - Abnormal; Notable for the following components:   Magnesium 1.4 (*)    All other components within normal limits  URINALYSIS, ROUTINE W REFLEX MICROSCOPIC - Abnormal; Notable for the following components:   Color, Urine YELLOW (*)    APPearance CLEAR (*)    All other components within normal limits  CBC WITH DIFFERENTIAL/PLATELET  LACTIC ACID, PLASMA  PHOSPHORUS  TSH  BASIC METABOLIC PANEL  CBC  PROTIME-INR  MAGNESIUM     EKG Atrial fibrillation, VR 123. [place] with IVCD. No acute ST elevations or depression. No ischemia or infarct.   RADIOLOGY CT head/C-spine: No acute intracranial malady, chronic C-spine changes with no acute injury or malalignment.   I also independently reviewed and agree with radiologist interpretations.   PROCEDURES:  Critical Care performed: Yes, see critical care procedure note(s)  Procedures    MEDICATIONS ORDERED IN ED: Medications  potassium chloride 10 mEq in 100 mL IVPB ( Intravenous Canceled Entry 03/28/22 1715)  LORazepam (ATIVAN) injection 1 mg (has no administration in time range)  ondansetron (ZOFRAN) injection 4 mg (has no administration in time range)  acetaminophen (TYLENOL) tablet 650 mg (has no administration in time range)  hydrALAZINE (APRESOLINE) injection 5 mg (has no administration in time  range)  atorvastatin (LIPITOR) tablet 80 mg (80 mg Oral Given 03/28/22 1638)  metoprolol tartrate (LOPRESSOR) tablet 100 mg (100 mg Oral Given 03/28/22 2210)  torsemide (DEMADEX) tablet 20 mg (20 mg Oral Given 03/28/22 1638)  cyanocobalamin (VITAMIN B12) tablet 1,000 mcg (1,000 mcg Oral Given 03/28/22 1638)  calcium-vitamin D (OSCAL WITH D) 500-5 MG-MCG per tablet 1 tablet (1 tablet Oral Not Given 03/28/22 1655)  cholecalciferol (VITAMIN D3) 25 MCG (1000 UNIT) tablet 2,000 Units ( Oral Canceled Entry 03/28/22 1655)  sodium chloride tablet 1 g (1 g Oral Given 03/28/22 1638)  Warfarin - Pharmacist Dosing Inpatient (0 each Does not apply Hold 03/28/22 1639)  metoprolol tartrate (LOPRESSOR) injection 5 mg (has no administration in time range)  diltiazem (CARDIZEM) injection 10 mg (10 mg Intravenous Given 03/28/22 1152)  levETIRAcetam (KEPPRA) IVPB 1000 mg/100 mL premix (0 mg Intravenous Stopped 03/28/22 1214)  sodium chloride 0.9 % bolus 500 mL (0 mLs Intravenous Stopped 03/28/22 1542)  potassium chloride SA (KLOR-CON M) CR tablet 40 mEq (40 mEq Oral Given 03/28/22 1343)  potassium chloride 10 mEq in 100 mL IVPB (0 mEq Intravenous Stopped 03/28/22 1450)  magnesium sulfate IVPB 2 g 50 mL (0 g Intravenous Stopped 03/28/22 1529)  diltiazem (CARDIZEM) injection 10 mg (10 mg Intravenous Given 03/28/22 1421)  magnesium sulfate IVPB 2 g 50 mL (0 g Intravenous Stopped 03/28/22 1639)  warfarin (COUMADIN) tablet 2.5 mg (2.5 mg Oral Given 03/28/22 1637)  potassium chloride SA (KLOR-CON M) CR tablet 40 mEq (40 mEq Oral Given 03/28/22 1812)     IMPRESSION / MDM / ASSESSMENT AND PLAN / ED COURSE  I reviewed the triage vital signs and the nursing notes.                               This patient presents to the ED for concern of syncope versus seizure, this involves an extensive number of treatment options, and is a complaint that carries with it a high risk of complications and morbidity.  The  differential diagnosis includes syncopal episode, A-fib RVR, seizure, stroke, intracranial hemorrhage, anemia, electrolyte abnormality   Co morbidities that complicate the patient evaluation  A-fib, history of breast cancer, history of stroke   Additional history obtained:  Additional history obtained from husband External records from outside source obtained and reviewed including prior PCP notes.   Lab Tests:  I Ordered, and personally interpreted labs.  The pertinent results include:   CBC shows no leukocytosis or anemia.  CMP with moderate dehydration with elevated BUN to  creatinine ratio and mild hypokalemia.  Lactic acid elevated at 3.0.  BNP elevated at 520.  Magnesium 1.4.  Urinalysis negative for UTI.   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  I personally viewed and interpreted the cardiac monitored which showed an underlying rhythm of: AFib  Problem List / ED Course / Critical interventions / Medication management  Syncope/arrhythmia Pt arrives in AFib RVR, suspect afib rvr contributing to syncope with possible convulsions, less likely primary seizure d/o CT head/Neck negative, no signs of trauma SHe did bite her tongue, but report from husband sounds more c/w syncope; she had been given dose of keppra initially but this can likely be held No signs of infection No ongoing CP No focal neuro deficits Admit to medicine I ordered medication including Diltiazem IV  for RVR  Reevaluation of the patient after these medicines showed that the patient improved I have  reviewed the patients home medicines and have made adjustments as needed   Social Determinants of Health:  Lives with elderly husband   Test / Admission - Considered:  Admit to medicine   FINAL CLINICAL IMPRESSION(S) / ED DIAGNOSES   Final diagnoses:  Atrial fibrillation with rapid ventricular response ([place])  Syncope, unspecified syncope type     Rx / DC Orders   ED Discharge Orders     None        Note:  This document was prepared using Dragon voice recognition software and may include unintentional dictation errors.   Duffy Bruce, MD 03/29/22 [phone]

## 2022-03-28 NOTE — ED Triage Notes (Signed)
Pt brought in via ems due to "full body convulsions" witnessed by husband, while walking with walker. No fall noted. Pt was placed in sitting position. Pt had AMS/post ictal upon ems arrival.  EMS states pt was going in between a-fib and a-flutter otw to hospital.

## 2022-03-28 NOTE — ED Notes (Signed)
Patient denies pain and is resting comfortably.  

## 2022-03-28 NOTE — H&P (Signed)
History and Physical    Kristin Mitchell JJK:[id number] DOB: 01-28-1941 DOA: 03/28/2022  Referring MD/NP/PA:   PCP: Latanya Maudlin, NP   Patient coming from:  The patient is coming from home.  At baseline, pt is independent for most of ADL.        Chief Complaint: syncope and palpitation  HPI: Kristin Mitchell is a 81 y.o. female with medical history significant of a fib on Coumadin, HTN, HLD, sCHF, CKD-3a, who presents with syncope and palpitation.  Patient was reported to have whole-body seizure-like activity, but when I talked to patient and her husband at bedside, patient seems to have had syncope episode.  Per patient's husband, patient initially had palpitation in the early morning, then passed out shortly.  She felt heart racing.  Both patient and her husband denies seizure activity or body shaking to me.  No unilateral numbness or tingling in extremities.  No facial droop or slurred speech.  Patient was reportedly to be confused initially, now mental status is back to baseline.  Patient is oriented x3 when I saw patient in ED. Patient denies chest pain or cough.  Patient had mild shortness breath earlier, which has resolved.  Currently no active chest pain or shortness breath.  No fever or chills.  Denies nausea vomiting, diarrhea or abdominal pain.  No symptoms of UTI.  Patient took last dose of Coumadin yesterday morning.   Patient was found to have A-fib with RVR, heart rate up to 138, which improved to 100s after giving Cardizem 10 mg x 2 in ED.  Data reviewed independently and ED Course: pt was found to have WBC 5.8, lactic acid 3.0, 1.8, INR 2.4, negative urinalysis, worsening renal function, potassium 3.2, magnesium 1.4, temperature normal, blood pressure 122/71, RR 20, oxygen saturation 96% on room air.  CT of head and CT of C-spine is negative for acute issues.  Patient is placed on PCU for observation.  CXR: 1. New superior subluxation of the right humeral head  relative to the glenoid may reflect posterior shoulder dislocation. Recommend dedicated right shoulder radiographs. 2. No acute cardiopulmonary process. 3. Cardiomegaly.  X-ray of right shoulder:  Displaced acromial fracture with disruption of the AC joint, unchanged from 01/15/2022 chest radiograph.   EKG: I have personally reviewed.  A-fib, heart rate 123, poor R wave progression, LAD.   Review of Systems:   General: no fevers, chills, no body weight gain, fatigue HEENT: no blurry vision, hearing changes or sore throat Respiratory: no dyspnea, coughing, wheezing CV: no chest pain, no palpitations GI: no nausea, vomiting, abdominal pain, diarrhea, constipation GU: no dysuria, burning on urination, increased urinary frequency, hematuria  Ext: no leg edema Neuro: no unilateral weakness, numbness, or tingling, no vision change or hearing loss. Has syncope Skin: no rash, no skin tear. MSK: No muscle spasm, no deformity, no limitation of range of movement in spin Heme: No easy bruising.  Travel history: No recent long distant travel.   Allergy:  Allergies  Allergen Reactions   Penicillins Rash    Past Medical History:  Diagnosis Date   A-fib ([place])    Arthritis    Breast cancer ([place]) 11/26/2014   Cancer ([place])    breast   Intermittent palpitations    in early am    Past Surgical History:  Procedure Laterality Date   ABDOMINAL HYSTERECTOMY     CATARACT EXTRACTION W/PHACO Left 11/26/2014   Procedure: CATARACT EXTRACTION PHACO AND INTRAOCULAR LENS PLACEMENT ([place]);  Surgeon: Remo Lipps  Dingeldein, MD;  Location: [facility] ORS;  Service: Ophthalmology;  Laterality: Left;  US:01:24 AP:26.6 CDE:40.16 PACk [id number]   CHOLECYSTECTOMY     EYE SURGERY     MASTECTOMY     bilateral    Social History:  reports that she has never smoked. She has never used smokeless tobacco. She reports that she does not drink alcohol and does not use drugs.  Family History:  Family History   Problem Relation Age of Onset   Parkinson's disease Mother    Hodgkin's lymphoma Father    Prostate cancer Neg Hx    Bladder Cancer Neg Hx    Kidney cancer Neg Hx      Prior to Admission medications   Medication Sig Start Date End Date Taking? Authorizing Provider  acetaminophen (TYLENOL) 500 MG tablet Take 500 mg by mouth every 6 (six) hours as needed.     [provider]  alendronate (FOSAMAX) 70 MG tablet Take with a full glass of water on an empty stomach. Patient taking differently: No sig reported 07/02/20   Lequita Asal, MD  atorvastatin (LIPITOR) 80 MG tablet Take 1 tablet (80 mg total) by mouth daily. 12/30/21   Loletha Grayer, MD  Calcium Carb-Cholecalciferol (CALCIUM 600+D) 600-800 MG-UNIT TABS Take 1 tablet by mouth daily.    [provider]  Cholecalciferol (VITAMIN D3) 50 MCG (2000 UT) TABS Take 1 tablet by mouth daily.    [provider]  metoprolol tartrate (LOPRESSOR) 100 MG tablet Take 1 tablet (100 mg total) by mouth 2 (two) times daily. 02/26/22   Sharen Hones, MD  sodium chloride 1 g tablet Take 1 tablet (1 g total) by mouth daily. 02/26/22   Sharen Hones, MD  torsemide (DEMADEX) 20 MG tablet Take 1 tablet (20 mg total) by mouth daily. 02/26/22   Sharen Hones, MD  vitamin B-12 (CYANOCOBALAMIN) 1000 MCG tablet Take 1,000 mcg by mouth daily.    [provider]  warfarin (COUMADIN) 2.5 MG tablet Take 1 tablet (2.5 mg total) by mouth daily. Patient not taking: Reported on 02/20/2022 12/31/21 01/30/22  Loletha Grayer, MD    Physical Exam: Vitals:   03/28/22 1300 03/28/22 1430 03/28/22 1530 03/28/22 1638  BP: 122/71  117/61 119/84  Pulse: (!) 138 100 97 (!) 106  Resp: (!) 23 20    Temp:      TempSrc:      SpO2: 100% 95% 98%   Weight:      Height:       General: Not in acute distress HEENT:       Eyes: PERRL, EOMI, no scleral icterus.       ENT: No discharge from the ears and nose, no pharynx injection, no tonsillar  enlargement.        Neck: No JVD, no bruit, no mass felt. Heme: No neck lymph node enlargement. Cardiac: S1/S2, RRR, irregularly irregular rhythm, No gallops or rubs. Respiratory: No rales, wheezing, rhonchi or rubs. GI: Soft, nondistended, nontender, no rebound pain, no organomegaly, BS present. GU: No hematuria Ext: No pitting leg edema bilaterally. 1+DP/PT pulse bilaterally. Musculoskeletal: No joint deformities, No joint redness or warmth, no limitation of ROM in spin. Skin: No rashes.  Neuro: Alert, oriented X3, cranial nerves II-XII grossly intact, moves all extremities normally. Psych: Patient is not psychotic, no suicidal or hemocidal ideation.  Labs on Admission: I have personally reviewed following labs and imaging studies  CBC: Recent Labs  Lab 03/28/22 1148  WBC 5.8  NEUTROABS 4.1  HGB 12.8  HCT 38.9  MCV 90.0  PLT 161   Basic Metabolic Panel: Recent Labs  Lab 03/28/22 1148 03/28/22 1204  NA 136  --   K 3.2*  --   CL 96*  --   CO2 26  --   GLUCOSE 109*  --   BUN 30*  --   CREATININE 1.46*  --   CALCIUM 9.4  --   MG 1.4*  --   PHOS  --  3.4   GFR: Estimated Creatinine Clearance: 30.6 mL/min (A) (by C-G formula based on SCr of 1.46 mg/dL (H)). Liver Function Tests: Recent Labs  Lab 03/28/22 1148  AST 33  ALT 22  ALKPHOS 115  BILITOT 1.1  PROT 7.1  ALBUMIN 3.5   No results for input(s): "LIPASE", "AMYLASE" in the last 168 hours. No results for input(s): "AMMONIA" in the last 168 hours. Coagulation Profile: Recent Labs  Lab 03/28/22 1148  INR 2.4*   Cardiac Enzymes: No results for input(s): "CKTOTAL", "CKMB", "CKMBINDEX", "TROPONINI" in the last 168 hours. BNP (last 3 results) No results for input(s): "PROBNP" in the last 8760 hours. HbA1C: No results for input(s): "HGBA1C" in the last 72 hours. CBG: No results for input(s): "GLUCAP" in the last 168 hours. Lipid Profile: No results for input(s): "CHOL", "HDL", "LDLCALC", "TRIG",  "CHOLHDL", "LDLDIRECT" in the last 72 hours. Thyroid Function Tests: No results for input(s): "TSH", "T4TOTAL", "FREET4", "T3FREE", "THYROIDAB" in the last 72 hours. Anemia Panel: No results for input(s): "VITAMINB12", "FOLATE", "FERRITIN", "TIBC", "IRON", "RETICCTPCT" in the last 72 hours. Urine analysis:    Component Value Date/Time   COLORURINE YELLOW (A) 03/28/2022 1344   APPEARANCEUR CLEAR (A) 03/28/2022 1344   LABSPEC 1.008 03/28/2022 1344   PHURINE 7.0 03/28/2022 1344   GLUCOSEU NEGATIVE 03/28/2022 1344   HGBUR NEGATIVE 03/28/2022 1344   BILIRUBINUR NEGATIVE 03/28/2022 1344   KETONESUR NEGATIVE 03/28/2022 1344   PROTEINUR NEGATIVE 03/28/2022 1344   NITRITE NEGATIVE 03/28/2022 1344   LEUKOCYTESUR NEGATIVE 03/28/2022 1344   Sepsis Labs: '@LABRCNTIP'$ (procalcitonin:4,lacticidven:4) )No results found for this or any previous visit (from the past 240 hour(s)).   Radiological Exams on Admission: MR BRAIN WO CONTRAST  Result Date: 03/28/2022 CLINICAL DATA:  Altered mental status EXAM: MRI HEAD WITHOUT CONTRAST TECHNIQUE: Multiplanar, multiecho pulse sequences of the brain and surrounding structures were obtained without intravenous contrast. COMPARISON:  None Available. FINDINGS: Brain: No acute infarction, hemorrhage, hydrocephalus, extra-axial collection or mass lesion. Chronic right parietal lobe infarcts sequela of mild microvascular ischemic change. Previously reported region of hypoattenuation in the right external capsule correlates with an area of chronic infarct. Vascular: Normal flow voids. Skull and upper cervical spine: Normal marrow signal. Sinuses/Orbits: Bilateral lens replacement. Other: None IMPRESSION: No acute intracranial abnormality. Previously reported area of asymmetric hypoattenuation in the right external capsule correlates with an area chronic infarct. Electronically Signed   By: Marin Roberts M.D.   On: 03/28/2022 17:53   DG Shoulder Right Portable  Result  Date: 03/28/2022 CLINICAL DATA:  Full-body convulsions. Irregularity of the RIGHT shoulder identified on chest radiograph today. EXAM: RIGHT SHOULDER - 1 VIEW COMPARISON:  03/28/2022, 01/15/2022 radiographs. FINDINGS: There a displaced acromial fracture with disruption of the [facility] joint, appears unchanged from 01/15/2022 chest radiograph. The acromion is laterally displaced 2 cm. No other significant abnormalities noted. IMPRESSION: Displaced acromial fracture with disruption of the AC joint, unchanged from 01/15/2022 chest radiograph. Electronically Signed   By: Margarette Canada M.D.   On: 03/28/2022 16:05  DG Chest Portable 1 View  Result Date: 03/28/2022 CLINICAL DATA:  Full-body convulsions with altered mental status EXAM: PORTABLE CHEST 1 VIEW COMPARISON:  Chest radiograph dated 02/20/2022 FINDINGS: Normal lung volumes. No focal consolidations. No pleural effusion or pneumothorax. Enlarged cardiomediastinal silhouette. The right humeral head appears superiorly subluxated relative to the glenoid, new from 02/20/2022. Multiple surgical clips project over the bilateral hemithorax and axillary regions. Right upper quadrant surgical clips. Partially imaged left upper quadrant embolization coils. IMPRESSION: 1. New superior subluxation of the right humeral head relative to the glenoid may reflect posterior shoulder dislocation. Recommend dedicated right shoulder radiographs. 2. No acute cardiopulmonary process. 3. Cardiomegaly. Electronically Signed   By: Darrin Nipper M.D.   On: 03/28/2022 14:08   CT HEAD WO CONTRAST (5MM)  Result Date: 03/28/2022 CLINICAL DATA:  Seizure. EXAM: CT HEAD WITHOUT CONTRAST CT CERVICAL SPINE WITHOUT CONTRAST TECHNIQUE: Multidetector CT imaging of the head and cervical spine was performed following the standard protocol without intravenous contrast. Multiplanar CT image reconstructions of the cervical spine were also generated. RADIATION DOSE REDUCTION: This exam was performed according  to the departmental dose-optimization program which includes automated exposure control, adjustment of the mA and/or kV according to patient size and/or use of iterative reconstruction technique. COMPARISON:  None Available. FINDINGS: CT HEAD FINDINGS Brain: No evidence of acute infarction, hemorrhage, hydrocephalus, extra-axial collection or mass lesion/mass effect. Nonspecific linear region of hypodensity along the external capsule on the right (series 2, image 12). Vascular: No hyperdense vessel or unexpected calcification. Skull: Normal. Negative for fracture or focal lesion. Degenerative changes of the right TMJ Sinuses/Orbits: Bilateral lens replacement. Paranasal sinuses are clear. Other: None. CT CERVICAL SPINE FINDINGS Alignment: Grade 1 anterolisthesis of C6 on C7, C7 on T1. Skull base and vertebrae: No acute fracture. No primary bone lesion or focal pathologic process. Soft tissues and spinal canal: No prevertebral fluid or swelling. No visible canal hematoma. Disc levels:  No evidence of high-grade spinal canal stenosis. Upper chest: Negative. Other: None IMPRESSION: CT HEAD: 1.  No CT evidence of intracranial injury. 2. Nonspecific linear region of hypoattenuation along the right internal capsule. Consider further evaluation with a brain MRI, if clinically indicated. CT CERVICAL SPINE: 1. No acute fracture or traumatic subluxation. 2. Grade 1 anterolisthesis of C6 on C7, C7 on T1. 3. No evidence of high-grade spinal canal stenosis. Electronically Signed   By: Marin Roberts M.D.   On: 03/28/2022 13:44   CT Cervical Spine Wo Contrast  Result Date: 03/28/2022 CLINICAL DATA:  Seizure. EXAM: CT HEAD WITHOUT CONTRAST CT CERVICAL SPINE WITHOUT CONTRAST TECHNIQUE: Multidetector CT imaging of the head and cervical spine was performed following the standard protocol without intravenous contrast. Multiplanar CT image reconstructions of the cervical spine were also generated. RADIATION DOSE REDUCTION: This  exam was performed according to the departmental dose-optimization program which includes automated exposure control, adjustment of the mA and/or kV according to patient size and/or use of iterative reconstruction technique. COMPARISON:  None Available. FINDINGS: CT HEAD FINDINGS Brain: No evidence of acute infarction, hemorrhage, hydrocephalus, extra-axial collection or mass lesion/mass effect. Nonspecific linear region of hypodensity along the external capsule on the right (series 2, image 12). Vascular: No hyperdense vessel or unexpected calcification. Skull: Normal. Negative for fracture or focal lesion. Degenerative changes of the right TMJ Sinuses/Orbits: Bilateral lens replacement. Paranasal sinuses are clear. Other: None. CT CERVICAL SPINE FINDINGS Alignment: Grade 1 anterolisthesis of C6 on C7, C7 on T1. Skull base and vertebrae: No acute fracture.  No primary bone lesion or focal pathologic process. Soft tissues and spinal canal: No prevertebral fluid or swelling. No visible canal hematoma. Disc levels:  No evidence of high-grade spinal canal stenosis. Upper chest: Negative. Other: None IMPRESSION: CT HEAD: 1.  No CT evidence of intracranial injury. 2. Nonspecific linear region of hypoattenuation along the right internal capsule. Consider further evaluation with a brain MRI, if clinically indicated. CT CERVICAL SPINE: 1. No acute fracture or traumatic subluxation. 2. Grade 1 anterolisthesis of C6 on C7, C7 on T1. 3. No evidence of high-grade spinal canal stenosis. Electronically Signed   By: Marin Roberts M.D.   On: 03/28/2022 13:44      Assessment/Plan Principal Problem:   Atrial fibrillation with RVR (HCC) Active Problems:   Syncope   Chronic systolic CHF (congestive heart failure) (HCC)   Hypokalemia   Hypomagnesemia   Essential hypertension   TIA (transient ischemic attack)   Hypercholesteremia   Chronic kidney disease, stage 3a (HCC)   Closed dislocation of right shoulder   Obesity  (BMI 30-39.9)   Assessment and Plan:  Atrial fibrillation with RVR ([place]): HR was up to 130s, improving to 100s now after giving 10 mg Cardizem twice.  Patient does not have chest pain.  Her shortness breath has resolved.  -Placed on PCU for observation -As needed metoprolol IV 5 mg q2h for HR > 125 -Continue home metoprolol 100 mg twice daily -Coumadin per pharm - check TSH  Syncope: Possibly due to A-fib with RVR.  CT head negative. -Follow-up EEG -Follow-up MRI for brain -Frequent neurochecks -Check orthostatic vital sign -IV fluid: 500 cc normal saline was given in the ED  Chronic systolic CHF (congestive heart failure) ([place]): Recent 2D echo on 02/22/2022 showed EF of 35-40% with grade 1 diastolic dysfunction.  BNP 520, but patient does not have leg edema or JVD, CHF seem to be compensated.  -Continue home torsemide 20 mg daily  Hypokalemia and Hypomagnesemia: Potassium 3.2, magnesium 1.4 -Repleted both -Check phosphorus level    Essential hypertension -IV hydralazine as needed -Patient is on torsemide and metoprolol    TIA (transient ischemic attack) -Lipitor -Patient is on Coumadin for atrial fibrillation    Hypercholesteremia -Lipitor    Chronic kidney disease, stage 3a ([place]): Worsening than baseline.  Baseline creatinine 0.90 on 02/26/2022.  Her creatinine is 1.46, BUN 30, GFR 36. 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  Patient does not have any pain.  Patient is following up with Dr. Posey Pronto of Ortho, last seen was on 02/05/2022.  Patient is on nonoperative treatment. -As needed Tylenol   Obesity (BMI 30-39.9): BMI= 34.27  and BW= 85kg -Diet and exercise.   -Encourage to lose weight.      DVT ppx: on Coumadin  Code Status:  Partial code (I discussed with the patient in the presence of husband, and explained the meaning of CODE STATUS, patient wants to be  partial code, OK for CPR, but no intubation).  Family Communication:   Yes, patient's husband at bed side.     Disposition Plan:  Anticipate discharge back to previous environment  Consults called:  none   Admission status and Level of care: Progressive:     for obs    Dispo: The patient is from: Home              Anticipated d/c is to: Home  Anticipated d/c date is: 1 day              Patient currently is not medically stable to d/c.    Severity of Illness:  The appropriate patient status for this patient is OBSERVATION. Observation status is judged to be reasonable and necessary in order to provide the required intensity of service to ensure the patient's safety. The patient's presenting symptoms, physical exam findings, and initial radiographic and laboratory data in the context of their medical condition is felt to place them at decreased risk for further clinical deterioration. Furthermore, it is anticipated that the patient will be medically stable for discharge from the hospital within 2 midnights of admission.        Date of Service 03/28/2022    [facility]   If 7PM-7AM, please contact night-coverage [email or web address] 03/28/2022, 6:01 PM

## 2022-03-28 NOTE — Progress Notes (Signed)
ANTICOAGULATION CONSULT NOTE - Initial Consult  Pharmacy Consult for warfarin Indication: atrial fibrillation  Allergies  Allergen Reactions   Penicillins Rash    Patient Measurements: Height: '5\' 2"'$  (157.5 cm) Weight: 85 kg (187 lb 6.3 oz) IBW/kg (Calculated) : 50.1  Vital Signs: Temp: 98.3 F (36.8 C) (11/18 1131) Temp Source: Oral (11/18 1131) BP: 122/71 (11/18 1300) Pulse Rate: 100 (11/18 1430)  Labs: Recent Labs    03/28/22 1148  HGB 12.8  HCT 38.9  PLT 160  LABPROT 26.1*  INR 2.4*  CREATININE 1.46*    Estimated Creatinine Clearance: 30.6 mL/min (A) (by C-G formula based on SCr of 1.46 mg/dL (H)).   Medical History: Past Medical History:  Diagnosis Date   A-fib [facility])    Arthritis    Breast cancer ([place]) 11/26/2014   Cancer ([place])    breast   Intermittent palpitations    in early am    Medications:  Warfarin 2.5 mg daily (last dose 11/17)  Assessment: 81 year old female PMH atrial fibrillation on warfarin PTA. Pharmacy has been consulted to manage warfarin while inpatient.   Baseline INR 2.4, CBC stable. Previous outpatient INR have been stable  Date INR Warfarin Dose  11/17 -- 2.5 mg  11/18 2.4 2.5 mg    Goal of Therapy:  INR 2-3 Monitor platelets by anticoagulation protocol: Yes   Plan: Give warfarin 2.5 mg x 1 (PTA dose) INR and CBC in AM.   Wynelle Cleveland 03/28/2022,3:30 PM

## 2022-03-29 DIAGNOSIS — I4891 Unspecified atrial fibrillation: Secondary | ICD-10-CM | POA: Diagnosis not present

## 2022-03-29 DIAGNOSIS — I482 Chronic atrial fibrillation, unspecified: Secondary | ICD-10-CM | POA: Diagnosis not present

## 2022-03-29 LAB — CBC
HCT: 34.7 % — ABNORMAL LOW (ref 36.0–46.0)
Hemoglobin: 11.8 g/dL — ABNORMAL LOW (ref 12.0–15.0)
MCH: 30.6 pg (ref 26.0–34.0)
MCHC: 34 g/dL (ref 30.0–36.0)
MCV: 89.9 fL (ref 80.0–100.0)
Platelets: 148 10*3/uL — ABNORMAL LOW (ref 150–400)
RBC: 3.86 MIL/uL — ABNORMAL LOW (ref 3.87–5.11)
RDW: 14.6 % (ref 11.5–15.5)
WBC: 7.6 10*3/uL (ref 4.0–10.5)
nRBC: 0 % (ref 0.0–0.2)

## 2022-03-29 LAB — BASIC METABOLIC PANEL
Anion gap: 8 (ref 5–15)
BUN: 26 mg/dL — ABNORMAL HIGH (ref 8–23)
CO2: 26 mmol/L (ref 22–32)
Calcium: 9.3 mg/dL (ref 8.9–10.3)
Chloride: 104 mmol/L (ref 98–111)
Creatinine, Ser: 1.19 mg/dL — ABNORMAL HIGH (ref 0.44–1.00)
GFR, Estimated: 46 mL/min — ABNORMAL LOW (ref >60)
Glucose, Bld: 89 mg/dL (ref 70–99)
Potassium: 4.3 mmol/L (ref 3.5–5.1)
Sodium: 138 mmol/L (ref 135–145)

## 2022-03-29 LAB — PROTIME-INR
INR: 2.8 — ABNORMAL HIGH (ref 0.8–1.2)
Prothrombin Time: 29.1 seconds — ABNORMAL HIGH (ref 11.4–15.2)

## 2022-03-29 LAB — GLUCOSE, CAPILLARY: Glucose-Capillary: 94 mg/dL (ref 70–99)

## 2022-03-29 LAB — MAGNESIUM: Magnesium: 2.4 mg/dL (ref 1.7–2.4)

## 2022-03-29 MED ORDER — WARFARIN SODIUM 2.5 MG PO TABS
2.5000 mg | ORAL_TABLET | Freq: Once | ORAL | Status: DC
  Filled 2022-03-29: qty 1

## 2022-03-29 MED ORDER — MAGNESIUM CHLORIDE 64 MG PO TBEC
1.0000 | DELAYED_RELEASE_TABLET | Freq: Every day | ORAL | Status: DC
  Administered 2022-03-29: 64 mg via ORAL
  Filled 2022-03-29: qty 1

## 2022-03-29 MED ORDER — MAGNESIUM CHLORIDE 64 MG PO TBEC: 1.0000 | DELAYED_RELEASE_TABLET | Freq: Every day | ORAL | 1 refills | Status: AC

## 2022-03-29 NOTE — Plan of Care (Signed)

## 2022-03-29 NOTE — Progress Notes (Signed)
  Transition of Care [facility]) Screening Note   Patient Details  Name: Kristin Mitchell Date of Birth: 10-30-1940   Transition of Care [facility]) CM/SW Contact:    Tiburcio Bash, LCSW Phone Number: 03/29/2022, 3:03 PM    Transition of Care Department [facility] has reviewed patient and no TOC needs have been identified at this time. We will continue to monitor patient advancement through interdisciplinary progression rounds. If new patient transition needs arise, please place a TOC consult.  Kelby Fam, [place], MSW, [place]

## 2022-03-29 NOTE — Discharge Summary (Signed)
Physician Discharge Summary   Patient: Kristin Mitchell MRN: [id number] DOB: Oct 18, 1940  Admit date:     03/28/2022  Discharge date: 03/29/22  Discharge Physician: Lorella Nimrod   PCP: Latanya Maudlin, NP   Recommendations at discharge:  Please obtain CBC, magnesium level and BMP in 1 week Follow-up with cardiology within a week Follow-up with primary care provider  Discharge Diagnoses: Principal Problem:   Atrial fibrillation with RVR ([place]) Active Problems:   Syncope   Chronic systolic CHF (congestive heart failure) (HCC)   Hypokalemia   Hypomagnesemia   Essential hypertension   TIA (transient ischemic attack)   Hypercholesteremia   Chronic kidney disease, stage 3a (HCC)   Closed dislocation of right shoulder   Obesity (BMI 30-39.9)   Hospital Course: Taken from H&P.  Kristin Mitchell is a 81 y.o. female with medical history significant of a fib on Coumadin, HTN, HLD, sCHF, CKD-3a, who presents with syncope and palpitation.    Both patient and her husband denies seizure activity or body shaking to me.  No unilateral numbness or tingling in extremities.  No facial droop or slurred speech.  Patient was reportedly to be confused initially, now mental status is back to baseline.   Patient was found to have A-fib with RVR, heart rate up to 138, which improved to 100s after giving Cardizem 10 mg x 2 in ED.   Data reviewed independently and ED Course: pt was found to have WBC 5.8, lactic acid 3.0, 1.8, INR 2.4, negative urinalysis, worsening renal function, potassium 3.2, magnesium 1.4, temperature normal, blood pressure 122/71, RR 20, oxygen saturation 96% on room air.  CT of head and CT of C-spine is negative for acute issues.   CXR: 1. New superior subluxation of the right humeral head relative to the glenoid may reflect posterior shoulder dislocation. Recommend dedicated right shoulder radiographs. 2. No acute cardiopulmonary process. 3. Cardiomegaly.   X-ray of  right shoulder:  Displaced acromial fracture with disruption of the AC joint, unchanged from 01/15/2022 chest radiograph.  11/19: Rate remained well controlled in 70s, INR 2.8 and therapeutic.  She was also found to have hypomagnesemia with magnesium of 1.4 which was repleted and magnesium this morning was 2.4.  Renal function started improving with creatinine at 1.19 this morning.  TSH normal. MRI brain was without any acute intracranial abnormality.  Patient feels at baseline, no more palpitations.  Heart rate in 70s.  Able to ambulate without any difficulty.  She is being started on magnesium supplement to keep the magnesium above 2.  She will continue on her current medications and need to have a close follow-up with her providers for further recommendations.   Consultants: None Procedures performed: None Disposition: Home Diet recommendation:  Discharge Diet Orders (From admission, onward)     Start     Ordered   03/29/22 0000  Diet - low sodium heart healthy        03/29/22 1254           Cardiac diet DISCHARGE MEDICATION: Allergies as of 03/29/2022       Reactions   Penicillins Rash        Medication List     TAKE these medications    acetaminophen 500 MG tablet Commonly known as: TYLENOL Take 500 mg by mouth every 6 (six) hours as needed.   alendronate 70 MG tablet Commonly known as: FOSAMAX Take with a full glass of water on an empty stomach. What changed:  how much  to take when to take this additional instructions   atorvastatin 80 MG tablet Commonly known as: LIPITOR Take 1 tablet (80 mg total) by mouth daily.   Calcium 600+D 600-20 MG-MCG Tabs Generic drug: Calcium Carb-Cholecalciferol Take 1 tablet by mouth daily.   cyanocobalamin 1000 MCG tablet Commonly known as: VITAMIN B12 Take 1,000 mcg by mouth daily.   magnesium chloride 64 MG Tbec SR tablet Commonly known as: SLOW-MAG Take 1 tablet (64 mg total) by mouth daily. Start taking on:  March 30, 2022   metoprolol tartrate 100 MG tablet Commonly known as: LOPRESSOR Take 1 tablet (100 mg total) by mouth 2 (two) times daily.   sodium chloride 1 g tablet Take 1 tablet (1 g total) by mouth daily.   torsemide 20 MG tablet Commonly known as: DEMADEX Take 1 tablet (20 mg total) by mouth daily.   Vitamin D3 50 MCG (2000 UT) Tabs Take 1 tablet by mouth daily.   warfarin 2.5 MG tablet Commonly known as: Coumadin Take 1 tablet (2.5 mg total) by mouth daily.        Follow-up Information     Latanya Maudlin, NP. Schedule an appointment as soon as possible for a visit in 1 week(s).   Specialty: Family Medicine Contact information: [address] [place] [address] [phone]                Discharge Exam: Danley Danker Weights   03/28/22 1215 03/29/22 0500  Weight: 85 kg 78.6 kg   General.     In no acute distress. Pulmonary.  Lungs clear bilaterally, normal respiratory effort. CV.  Irregular with normal rate Abdomen.  Soft, nontender, nondistended, BS positive. CNS.  Alert and oriented .  No focal neurologic deficit. Extremities.  No edema, no cyanosis, pulses intact and symmetrical. Psychiatry.  Judgment and insight appears normal.   Condition at discharge: stable  The results of significant diagnostics from this hospitalization (including imaging, microbiology, ancillary and laboratory) are listed below for reference.   Imaging Studies: MR BRAIN WO CONTRAST  Result Date: 03/28/2022 CLINICAL DATA:  Altered mental status EXAM: MRI HEAD WITHOUT CONTRAST TECHNIQUE: Multiplanar, multiecho pulse sequences of the brain and surrounding structures were obtained without intravenous contrast. COMPARISON:  None Available. FINDINGS: Brain: No acute infarction, hemorrhage, hydrocephalus, extra-axial collection or mass lesion. Chronic right parietal lobe infarcts sequela of mild microvascular ischemic change. Previously reported region of hypoattenuation  in the right external capsule correlates with an area of chronic infarct. Vascular: Normal flow voids. Skull and upper cervical spine: Normal marrow signal. Sinuses/Orbits: Bilateral lens replacement. Other: None IMPRESSION: No acute intracranial abnormality. Previously reported area of asymmetric hypoattenuation in the right external capsule correlates with an area chronic infarct. Electronically Signed   By: Marin Roberts M.D.   On: 03/28/2022 17:53   DG Shoulder Right Portable  Result Date: 03/28/2022 CLINICAL DATA:  Full-body convulsions. Irregularity of the RIGHT shoulder identified on chest radiograph today. EXAM: RIGHT SHOULDER - 1 VIEW COMPARISON:  03/28/2022, 01/15/2022 radiographs. FINDINGS: There a displaced acromial fracture with disruption of the [facility] joint, appears unchanged from 01/15/2022 chest radiograph. The acromion is laterally displaced 2 cm. No other significant abnormalities noted. IMPRESSION: Displaced acromial fracture with disruption of the AC joint, unchanged from 01/15/2022 chest radiograph. Electronically Signed   By: Margarette Canada M.D.   On: 03/28/2022 16:05   DG Chest Portable 1 View  Result Date: 03/28/2022 CLINICAL DATA:  Full-body convulsions with altered mental status EXAM: PORTABLE CHEST  1 VIEW COMPARISON:  Chest radiograph dated 02/20/2022 FINDINGS: Normal lung volumes. No focal consolidations. No pleural effusion or pneumothorax. Enlarged cardiomediastinal silhouette. The right humeral head appears superiorly subluxated relative to the glenoid, new from 02/20/2022. Multiple surgical clips project over the bilateral hemithorax and axillary regions. Right upper quadrant surgical clips. Partially imaged left upper quadrant embolization coils. IMPRESSION: 1. New superior subluxation of the right humeral head relative to the glenoid may reflect posterior shoulder dislocation. Recommend dedicated right shoulder radiographs. 2. No acute cardiopulmonary process. 3. Cardiomegaly.  Electronically Signed   By: Darrin Nipper M.D.   On: 03/28/2022 14:08   CT HEAD WO CONTRAST (5MM)  Result Date: 03/28/2022 CLINICAL DATA:  Seizure. EXAM: CT HEAD WITHOUT CONTRAST CT CERVICAL SPINE WITHOUT CONTRAST TECHNIQUE: Multidetector CT imaging of the head and cervical spine was performed following the standard protocol without intravenous contrast. Multiplanar CT image reconstructions of the cervical spine were also generated. RADIATION DOSE REDUCTION: This exam was performed according to the departmental dose-optimization program which includes automated exposure control, adjustment of the mA and/or kV according to patient size and/or use of iterative reconstruction technique. COMPARISON:  None Available. FINDINGS: CT HEAD FINDINGS Brain: No evidence of acute infarction, hemorrhage, hydrocephalus, extra-axial collection or mass lesion/mass effect. Nonspecific linear region of hypodensity along the external capsule on the right (series 2, image 12). Vascular: No hyperdense vessel or unexpected calcification. Skull: Normal. Negative for fracture or focal lesion. Degenerative changes of the right TMJ Sinuses/Orbits: Bilateral lens replacement. Paranasal sinuses are clear. Other: None. CT CERVICAL SPINE FINDINGS Alignment: Grade 1 anterolisthesis of C6 on C7, C7 on T1. Skull base and vertebrae: No acute fracture. No primary bone lesion or focal pathologic process. Soft tissues and spinal canal: No prevertebral fluid or swelling. No visible canal hematoma. Disc levels:  No evidence of high-grade spinal canal stenosis. Upper chest: Negative. Other: None IMPRESSION: CT HEAD: 1.  No CT evidence of intracranial injury. 2. Nonspecific linear region of hypoattenuation along the right internal capsule. Consider further evaluation with a brain MRI, if clinically indicated. CT CERVICAL SPINE: 1. No acute fracture or traumatic subluxation. 2. Grade 1 anterolisthesis of C6 on C7, C7 on T1. 3. No evidence of high-grade  spinal canal stenosis. Electronically Signed   By: Marin Roberts M.D.   On: 03/28/2022 13:44   CT Cervical Spine Wo Contrast  Result Date: 03/28/2022 CLINICAL DATA:  Seizure. EXAM: CT HEAD WITHOUT CONTRAST CT CERVICAL SPINE WITHOUT CONTRAST TECHNIQUE: Multidetector CT imaging of the head and cervical spine was performed following the standard protocol without intravenous contrast. Multiplanar CT image reconstructions of the cervical spine were also generated. RADIATION DOSE REDUCTION: This exam was performed according to the departmental dose-optimization program which includes automated exposure control, adjustment of the mA and/or kV according to patient size and/or use of iterative reconstruction technique. COMPARISON:  None Available. FINDINGS: CT HEAD FINDINGS Brain: No evidence of acute infarction, hemorrhage, hydrocephalus, extra-axial collection or mass lesion/mass effect. Nonspecific linear region of hypodensity along the external capsule on the right (series 2, image 12). Vascular: No hyperdense vessel or unexpected calcification. Skull: Normal. Negative for fracture or focal lesion. Degenerative changes of the right TMJ Sinuses/Orbits: Bilateral lens replacement. Paranasal sinuses are clear. Other: None. CT CERVICAL SPINE FINDINGS Alignment: Grade 1 anterolisthesis of C6 on C7, C7 on T1. Skull base and vertebrae: No acute fracture. No primary bone lesion or focal pathologic process. Soft tissues and spinal canal: No prevertebral fluid or swelling. No visible canal  hematoma. Disc levels:  No evidence of high-grade spinal canal stenosis. Upper chest: Negative. Other: None IMPRESSION: CT HEAD: 1.  No CT evidence of intracranial injury. 2. Nonspecific linear region of hypoattenuation along the right internal capsule. Consider further evaluation with a brain MRI, if clinically indicated. CT CERVICAL SPINE: 1. No acute fracture or traumatic subluxation. 2. Grade 1 anterolisthesis of C6 on C7, C7 on T1. 3.  No evidence of high-grade spinal canal stenosis. Electronically Signed   By: Marin Roberts M.D.   On: 03/28/2022 13:44    Microbiology: Results for orders placed or performed during the hospital encounter of 01/15/22  SARS Coronavirus 2 by RT PCR (hospital order, performed in [facility] hospital lab) *cepheid single result test* Anterior Nasal Swab     Status: None   Collection Time: 01/15/22  5:18 AM   Specimen: Anterior Nasal Swab  Result Value Ref Range Status   SARS Coronavirus 2 by RT PCR NEGATIVE NEGATIVE Final    Comment: (NOTE) SARS-CoV-2 target nucleic acids are NOT DETECTED.  The SARS-CoV-2 RNA is generally detectable in upper and lower respiratory specimens during the acute phase of infection. The lowest concentration of SARS-CoV-2 viral copies this assay can detect is 250 copies / mL. A negative result does not preclude SARS-CoV-2 infection and should not be used as the sole basis for treatment or other patient management decisions.  A negative result may occur with improper specimen collection / handling, submission of specimen other than nasopharyngeal swab, presence of viral mutation(s) within the areas targeted by this assay, and inadequate number of viral copies (<250 copies / mL). A negative result must be combined with clinical observations, patient history, and epidemiological information.  Fact Sheet for Patients:   https://www.patel.info/  Fact Sheet for Healthcare Providers: https://hall.com/  This test is not yet approved or  cleared by the [place] FDA and has been authorized for detection and/or diagnosis of SARS-CoV-2 by FDA under an Emergency Use Authorization (EUA).  This EUA will remain in effect (meaning this test can be used) for the duration of the COVID-19 declaration under Section 564(b)(1) of the Act, 21 U.S.C. section 360bbb-3(b)(1), unless the authorization is terminated or revoked  sooner.  Performed at [facility], [address] [place], [place] [address]   Urine Culture     Status: Abnormal   Collection Time: 01/15/22  5:18 AM   Specimen: Urine, Clean Catch  Result Value Ref Range Status   Specimen Description   Final    URINE, CLEAN CATCH Performed at [facility], [address], [place], [place] [address]    Special Requests   Final    NONE Performed at [facility], [address] [place], [place] [address]    Culture >=100,000 COLONIES/mL ESCHERICHIA COLI (A)  Final   Report Status 01/17/2022 FINAL  Final   Organism ID, Bacteria ESCHERICHIA COLI (A)  Final      Susceptibility   Escherichia coli - MIC*    AMPICILLIN <=2 SENSITIVE Sensitive     CEFAZOLIN <=4 SENSITIVE Sensitive     CEFEPIME <=0.12 SENSITIVE Sensitive     CEFTRIAXONE <=0.25 SENSITIVE Sensitive     CIPROFLOXACIN <=0.25 SENSITIVE Sensitive     GENTAMICIN <=1 SENSITIVE Sensitive     IMIPENEM <=0.25 SENSITIVE Sensitive     NITROFURANTOIN <=16 SENSITIVE Sensitive     TRIMETH/SULFA <=20 SENSITIVE Sensitive     AMPICILLIN/SULBACTAM <=2 SENSITIVE Sensitive     PIP/TAZO <=4 SENSITIVE Sensitive     * >=  100,000 COLONIES/mL ESCHERICHIA COLI    Labs: CBC: Recent Labs  Lab 03/28/22 1148 03/29/22 0447  WBC 5.8 7.6  NEUTROABS 4.1  --   HGB 12.8 11.8*  HCT 38.9 34.7*  MCV 90.0 89.9  PLT 160 466*   Basic Metabolic Panel: Recent Labs  Lab 03/28/22 1148 03/28/22 1204 03/29/22 0447  NA 136  --  138  K 3.2*  --  4.3  CL 96*  --  104  CO2 26  --  26  GLUCOSE 109*  --  89  BUN 30*  --  26*  CREATININE 1.46*  --  1.19*  CALCIUM 9.4  --  9.3  MG 1.4*  --  2.4  PHOS  --  3.4  --    Liver Function Tests: Recent Labs  Lab 03/28/22 1148  AST 33  ALT 22  ALKPHOS 115  BILITOT 1.1  PROT 7.1  ALBUMIN 3.5   CBG: Recent Labs  Lab 03/29/22 0831  GLUCAP 94    Discharge time spent: greater than 30 minutes.  This record has been created using  Systems analyst. Errors have been sought and corrected,but may not always be located. Such creation errors do not reflect on the standard of care.   Signed: Lorella Nimrod, MD [facility] 03/29/2022

## 2022-03-29 NOTE — Progress Notes (Signed)
ANTICOAGULATION CONSULT NOTE  Pharmacy Consult for Warfarin Indication: atrial fibrillation  Patient Measurements: Height: '5\' 2"'$  (157.5 cm) Weight: 78.6 kg (173 lb 4.5 oz) IBW/kg (Calculated) : 50.1  Labs: Recent Labs    03/28/22 1148 03/29/22 0447  HGB 12.8 11.8*  HCT 38.9 34.7*  PLT 160 148*  LABPROT 26.1* 29.1*  INR 2.4* 2.8*  CREATININE 1.46* 1.19*     Estimated Creatinine Clearance: 36 mL/min (A) (by C-G formula based on SCr of 1.19 mg/dL (H)).   Medical History: Past Medical History:  Diagnosis Date   A-fib [facility])    Arthritis    Breast cancer ([place]) 11/26/2014   Cancer ([place])    breast   Intermittent palpitations    in early am    Medications:  Warfarin 2.5 mg daily (last dose 11/17)  Assessment: 81 year old female PMH atrial fibrillation on warfarin PTA. Pharmacy has been consulted to manage warfarin while inpatient.   Previous outpatient INR have been stable. CBC stable  Date INR Warfarin Dose  11/17 -- 2.5 mg  11/18 2.4 2.5 mg  11/19 2.8 2.5 mg   No significant DDIs identified. LFTs within normal limits  Goal of Therapy:  INR 2-3 Monitor platelets by anticoagulation protocol: Yes   Plan:  --Warfarin 2.5 mg tonight (home dose) --Daily INR per protocol --CBC at least every 3 days per protocol   Benita Gutter 03/29/2022,10:34 AM

## 2022-03-29 NOTE — Hospital Course (Addendum)
Taken from H&P.  Kristin Mitchell is a 81 y.o. female with medical history significant of a fib on Coumadin, HTN, HLD, sCHF, CKD-3a, who presents with syncope and palpitation.    Both patient and her husband denies seizure activity or body shaking to me.  No unilateral numbness or tingling in extremities.  No facial droop or slurred speech.  Patient was reportedly to be confused initially, now mental status is back to baseline.   Patient was found to have A-fib with RVR, heart rate up to 138, which improved to 100s after giving Cardizem 10 mg x 2 in ED.   Data reviewed independently and ED Course: pt was found to have WBC 5.8, lactic acid 3.0, 1.8, INR 2.4, negative urinalysis, worsening renal function, potassium 3.2, magnesium 1.4, temperature normal, blood pressure 122/71, RR 20, oxygen saturation 96% on room air.  CT of head and CT of C-spine is negative for acute issues.   CXR: 1. New superior subluxation of the right humeral head relative to the glenoid may reflect posterior shoulder dislocation. Recommend dedicated right shoulder radiographs. 2. No acute cardiopulmonary process. 3. Cardiomegaly.   X-ray of right shoulder:  Displaced acromial fracture with disruption of the AC joint, unchanged from 01/15/2022 chest radiograph.  11/19: Rate remained well controlled in 70s, INR 2.8 and therapeutic.  She was also found to have hypomagnesemia with magnesium of 1.4 which was repleted and magnesium this morning was 2.4.  Renal function started improving with creatinine at 1.19 this morning.  TSH normal. MRI brain was without any acute intracranial abnormality.  Patient feels at baseline, no more palpitations.  Heart rate in 70s.  Able to ambulate without any difficulty.  She is being started on magnesium supplement to keep the magnesium above 2.  She will continue on her current medications and need to have a close follow-up with her providers for further recommendations.

## 2022-04-14 DIAGNOSIS — N3281 Overactive bladder: Secondary | ICD-10-CM | POA: Insufficient documentation

## 2022-04-14 DIAGNOSIS — R7303 Prediabetes: Secondary | ICD-10-CM | POA: Insufficient documentation

## 2022-05-11 DIAGNOSIS — I639 Cerebral infarction, unspecified: Secondary | ICD-10-CM

## 2022-05-11 HISTORY — DX: Cerebral infarction, unspecified: I63.9

## 2022-07-03 ENCOUNTER — Ambulatory Visit
Admission: RE | Admit: 2022-07-03 | Discharge: 2022-07-03 | Disposition: A | Discharge status: Home / Self Care | Payer: Medicare Other | Source: Ambulatory Visit | Attending: Urology | Admitting: Urology

## 2022-07-03 DIAGNOSIS — N2889 Other specified disorders of kidney and ureter: Secondary | ICD-10-CM | POA: Insufficient documentation

## 2022-07-03 MED ORDER — GADOBUTROL 1 MMOL/ML IV SOLN
7.5000 mL | Freq: Once | INTRAVENOUS | Status: AC | PRN
  Administered 2022-07-03: 7.5 mL via INTRAVENOUS

## 2022-07-10 ENCOUNTER — Ambulatory Visit (INDEPENDENT_AMBULATORY_CARE_PROVIDER_SITE_OTHER): Payer: Medicare Other | Admitting: Urology

## 2022-07-10 VITALS — BP 104/62 | HR 142 | Ht 62.0 in | Wt 169.0 lb

## 2022-07-10 DIAGNOSIS — N3941 Urge incontinence: Secondary | ICD-10-CM | POA: Diagnosis not present

## 2022-07-10 DIAGNOSIS — N3281 Overactive bladder: Secondary | ICD-10-CM

## 2022-07-10 DIAGNOSIS — N281 Cyst of kidney, acquired: Secondary | ICD-10-CM | POA: Diagnosis not present

## 2022-07-10 NOTE — Progress Notes (Signed)
Kristin Mitchell,acting as a scribe for Hollice Espy, MD.,have documented all relevant documentation on the behalf of Hollice Espy, MD,as directed by  Hollice Espy, MD while in the presence of Hollice Espy, MD.  07/10/2022 1:43 PM   Glorious Peach Jerline Pain Andres Labrum 04/22/1941 [id number]  Referring provider: Latanya Maudlin, NP [address] [place],  [place] [id number]  Chief Complaint  Patient presents with   renal mass    Follow up    HPI: 82 year-old female who returns for a follow up of her renal lesion. She was last seen by me in 12/2021 at which time she was known to have a 2.8 cm incidental right posterior upper pole heterogenous exophytic mass first identified in 2021. She has had several additional interval images. The mass was last imaged in 12/2021 at which time I felt the lesion measured 3.2 and disagreed with the radiologic interpretation. The morphology was overall stable, but it had increased in size and it is was felt to be a  Bosniak 49F at the time.   MRI today was reviewed. The lesion now measures 3.0 by 2.4 cm. In the light of decreasing size and signal characteristics, it is more characteristic of a resolving hemorrhagic/proteinaceous cyst.   Today, she reports worsening urinary incontinence for the past several years. She wears pads daily. She has been prescribed medication in the past but has been taking it. She would like to discuss treatment options regarding this. She reports that she does utilize a bedside commode throughout the night.   PMH: Past Medical History:  Diagnosis Date   A-fib [facility])    Arthritis    Breast cancer ([place]) 11/26/2014   Cancer ([place])    breast   Intermittent palpitations    in early am    Surgical History: Past Surgical History:  Procedure Laterality Date   ABDOMINAL HYSTERECTOMY     CATARACT EXTRACTION W/PHACO Left 11/26/2014   Procedure: CATARACT EXTRACTION PHACO AND INTRAOCULAR LENS PLACEMENT ([place]);  Surgeon: Estill Cotta,  MD;  Location: [facility] ORS;  Service: Ophthalmology;  Laterality: Left;  US:01:24 AP:26.6 CDE:40.16 PACk [id number] H   CHOLECYSTECTOMY     EYE SURGERY     MASTECTOMY     bilateral    Home Medications:  Allergies as of 07/10/2022       Reactions   Penicillins Rash        Medication List        Accurate as of July 10, 2022  1:43 PM. If you have any questions, ask your nurse or doctor.          acetaminophen 500 MG tablet Commonly known as: TYLENOL Take 500 mg by mouth every 6 (six) hours as needed.   alendronate 70 MG tablet Commonly known as: FOSAMAX Take with a full glass of water on an empty stomach. What changed:  how much to take when to take this additional instructions   atorvastatin 80 MG tablet Commonly known as: LIPITOR Take 1 tablet (80 mg total) by mouth daily.   Calcium 600+D 600-20 MG-MCG Tabs Generic drug: Calcium Carb-Cholecalciferol Take 1 tablet by mouth daily.   cyanocobalamin 1000 MCG tablet Commonly known as: VITAMIN B12 Take 1,000 mcg by mouth daily.   magnesium chloride 64 MG Tbec SR tablet Commonly known as: SLOW-MAG Take 1 tablet (64 mg total) by mouth daily.   metoprolol tartrate 100 MG tablet Commonly known as: LOPRESSOR Take 1 tablet (100 mg total) by mouth 2 (two) times daily.  sodium chloride 1 g tablet Take 1 tablet (1 g total) by mouth daily.   torsemide 20 MG tablet Commonly known as: DEMADEX Take 1 tablet (20 mg total) by mouth daily.   Vitamin D3 50 MCG (2000 UT) Tabs Generic drug: Cholecalciferol Take 1 tablet by mouth daily.   warfarin 2.5 MG tablet Commonly known as: Coumadin Take 1 tablet (2.5 mg total) by mouth daily.        Allergies:  Allergies  Allergen Reactions   Penicillins Rash    Family History: Family History  Problem Relation Age of Onset   Parkinson's disease Mother    Hodgkin's lymphoma Father    Prostate cancer Neg Hx    Bladder Cancer Neg Hx    Kidney cancer Neg Hx      Social History:  reports that she has never smoked. She has never used smokeless tobacco. She reports that she does not drink alcohol and does not use drugs.   Physical Exam: BP 104/62   Pulse (!) 142   Ht '5\' 2"'$  (1.575 m)   Wt 169 lb (76.7 kg)   BMI 30.91 kg/m   Constitutional:  Alert and oriented, No acute distress. HEENT: [place] AT, moist mucus membranes.  Trachea midline, no masses. Neurologic: Grossly intact, no focal deficits, moving all 4 extremities. Psychiatric: Normal mood and affect.  Pertinent imaging: CLINICAL DATA:  Indeterminate renal lesion   EXAM: MRI ABDOMEN WITHOUT AND WITH CONTRAST   TECHNIQUE: Multiplanar multisequence MR imaging of the abdomen was performed both before and after the administration of intravenous contrast.   CONTRAST:  7.56m GADAVIST GADOBUTROL 1 MMOL/ML IV SOLN   COMPARISON:  12/19/2021   FINDINGS: Lower chest: No acute abnormality.   Hepatobiliary: No focal liver abnormality is seen. Status post cholecystectomy. No biliary dilatation.   Pancreas: Unremarkable. No pancreatic ductal dilatation or surrounding inflammatory changes.   Spleen: Normal in size without significant abnormality.   Adrenals/Urinary Tract: Adrenal glands are unremarkable. Interval decrease in size of an exophytic lesion arising from the peripheral superior pole of the right kidney, measuring 3.0 x 2.4 cm, previously 3.9 x 3.2 cm, with heterogeneous internal T1 hyperintensity and T2 hypointensity, and without evidence of associated contrast enhancement (series 15, image 40). Kidneys are otherwise normal, without obvious renal calculi, solid lesion, or hydronephrosis.   Stomach/Bowel: Stomach is within normal limits. No evidence of bowel wall thickening, distention, or inflammatory changes.   Vascular/Lymphatic: Aortic atherosclerosis. No enlarged abdominal lymph nodes.   Other: No abdominal wall hernia or abnormality. No ascites.   Musculoskeletal:  No acute or significant osseous findings.   IMPRESSION: Interval decrease in size of an exophytic lesion arising from the peripheral superior pole of the right kidney, measuring 3.0 x 2.4 cm, previously 3.9 x 3.2 cm, signal characteristics and behavior consistent with a resolving hemorrhagic or proteinaceous cyst. No further follow-up or characterization is required.   Aortic Atherosclerosis (ICD10-I70.0).     Electronically Signed   By: ADelanna AhmadiM.D.   On: 07/04/2022 22:03   This was personally reviewed and I agree with the radiologic interpretation.    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 Overall characteristics have not changed but it seems to be involuting which is more consistent with a benign lesion. - At this point, in light of her age and comorbidities, we would not recommend any further evaluation or reevaluation.   2. Urgency/ urge incontinence - Exacerbated by her  torsemide. - We discussed behavioral modification and strategies for managing her incontinence versus consideration of therapy. We're gonna try to avoid anticholinergics given her age and risk for confusion.Ultimately, she did not pursue this and will let us know if her symptoms worsened. She would in fact like to try a drug down the road.   Return if symptoms worsen or fail to improve.  [place] [address], [place], [place] [address] [phone]

## 2022-10-22 ENCOUNTER — Other Ambulatory Visit
Admission: RE | Admit: 2022-10-22 | Discharge: 2022-10-22 | Disposition: A | Discharge status: Home / Self Care | Payer: Medicare Other | Source: Ambulatory Visit | Attending: Student | Admitting: Student

## 2022-10-22 DIAGNOSIS — R600 Localized edema: Secondary | ICD-10-CM | POA: Diagnosis present

## 2022-10-22 LAB — BRAIN NATRIURETIC PEPTIDE: B Natriuretic Peptide: 2187.9 pg/mL — ABNORMAL HIGH (ref 0.0–100.0)

## 2022-12-08 ENCOUNTER — Encounter (INDEPENDENT_AMBULATORY_CARE_PROVIDER_SITE_OTHER): Payer: Self-pay | Admitting: Vascular Surgery

## 2022-12-08 ENCOUNTER — Ambulatory Visit (INDEPENDENT_AMBULATORY_CARE_PROVIDER_SITE_OTHER): Payer: Medicare Other | Admitting: Vascular Surgery

## 2022-12-08 VITALS — BP 113/66 | HR 56 | Resp 15 | Wt 170.0 lb

## 2022-12-08 DIAGNOSIS — N1831 Chronic kidney disease, stage 3a: Secondary | ICD-10-CM | POA: Diagnosis not present

## 2022-12-08 DIAGNOSIS — E7800 Pure hypercholesterolemia, unspecified: Secondary | ICD-10-CM

## 2022-12-08 DIAGNOSIS — I10 Essential (primary) hypertension: Secondary | ICD-10-CM

## 2022-12-08 DIAGNOSIS — I890 Lymphedema, not elsewhere classified: Secondary | ICD-10-CM

## 2022-12-08 DIAGNOSIS — I5022 Chronic systolic (congestive) heart failure: Secondary | ICD-10-CM

## 2022-12-08 NOTE — Progress Notes (Unsigned)
Patient ID: Kristin Mitchell, female   DOB: 04-Oct-1940, 82 y.o.   MRN: [id number]  Chief Complaint  Patient presents with   Follow-up    Ref Lenard Forth consult ble edema    HPI Kristin Mitchell is a 82 y.o. female.  I am asked to see the patient by Dedra Skeens for evaluation of marked leg swelling.  The patient has had swelling of the legs for many years.  She was actually seen in our office over 3 years ago for evaluation.  She has had mobility issues and other medical issues and her swelling has gotten worse.  Both legs are affected.  No open wounds or signs of infection.  No chest pain or shortness of breath.  No previous history of DVT or superficial thrombophlebitis to her knowledge.     Past Medical History:  Diagnosis Date   A-fib [facility])    Arthritis    Breast cancer ([facility]) 11/26/2014   Cancer ([facility])    breast   Intermittent palpitations    in early am    Past Surgical History:  Procedure Laterality Date   ABDOMINAL HYSTERECTOMY     CATARACT EXTRACTION W/PHACO Left 11/26/2014   Procedure: CATARACT EXTRACTION PHACO AND INTRAOCULAR LENS PLACEMENT ([facility]);  Surgeon: Sallee Lange, MD;  Location: [facility] ORS;  Service: Ophthalmology;  Laterality: Left;  US:01:24 AP:26.6 CDE:40.16 PACk [id number] H   CHOLECYSTECTOMY     EYE SURGERY     MASTECTOMY     bilateral     Family History  Problem Relation Age of Onset   Parkinson's disease Mother    Hodgkin's lymphoma Father    Prostate cancer Neg Hx    Bladder Cancer Neg Hx    Kidney cancer Neg Hx       Social History   Tobacco Use   Smoking status: Never   Smokeless tobacco: Never  Substance Use Topics   Alcohol use: No   Drug use: Never     Allergies  Allergen Reactions   Penicillins Rash    Current Outpatient Medications  Medication Sig Dispense Refill   acetaminophen (TYLENOL) 500 MG tablet Take 500 mg by mouth every 6 (six) hours as needed.      alendronate (FOSAMAX) 70 MG tablet Take with a full  glass of water on an empty stomach. (Patient taking differently: 70 mg once a week.) 12 tablet 0   atorvastatin (LIPITOR) 80 MG tablet Take 1 tablet (80 mg total) by mouth daily. 30 tablet 0   Calcium Carb-Cholecalciferol (CALCIUM 600+D) 600-800 MG-UNIT TABS Take 1 tablet by mouth daily.     magnesium chloride (SLOW-MAG) 64 MG TBEC SR tablet Take 1 tablet (64 mg total) by mouth daily. 60 tablet 1   metoprolol tartrate (LOPRESSOR) 100 MG tablet Take 1 tablet (100 mg total) by mouth 2 (two) times daily. 60 tablet 0   sodium chloride 1 g tablet Take 1 tablet (1 g total) by mouth daily.     torsemide (DEMADEX) 20 MG tablet Take 1 tablet (20 mg total) by mouth daily. 30 tablet 0   vitamin B-12 (CYANOCOBALAMIN) 1000 MCG tablet Take 1,000 mcg by mouth daily.     warfarin (COUMADIN) 2.5 MG tablet Take 1 tablet (2.5 mg total) by mouth daily. 30 tablet 0   Cholecalciferol (VITAMIN D3) 50 MCG (2000 UT) TABS Take 1 tablet by mouth daily. (Patient not taking: Reported on 12/08/2022)     No current facility-administered medications for this visit.  REVIEW OF SYSTEMS (Negative unless checked)  Constitutional: [] Weight loss  [] Fever  [] Chills Cardiac: [] Chest pain   [] Chest pressure   [x] Palpitations   [] Shortness of breath when laying flat   [] Shortness of breath at rest   [] Shortness of breath with exertion. Vascular:  [] Pain in legs with walking   [x] Pain in legs at rest   [] Pain in legs when laying flat   [] Claudication   [] Pain in feet when walking  [] Pain in feet at rest  [] Pain in feet when laying flat   [] History of DVT   [] Phlebitis   [x] Swelling in legs   [] Varicose veins   [] Non-healing ulcers Pulmonary:   [] Uses home oxygen   [] Productive cough   [] Hemoptysis   [] Wheeze  [] COPD   [] Asthma Neurologic:  [] Dizziness  [] Blackouts   [] Seizures   [] History of stroke   [] History of TIA  [] Aphasia   [] Temporary blindness   [] Dysphagia   [] Weakness or numbness in arms   [] Weakness or numbness in  legs Musculoskeletal:  [x] Arthritis   [] Joint swelling   [x] Joint pain   [] Low back pain Hematologic:  [] Easy bruising  [] Easy bleeding   [] Hypercoagulable state   [] Anemic  [] Hepatitis Gastrointestinal:  [] Blood in stool   [] Vomiting blood  [] Gastroesophageal reflux/heartburn   [] Abdominal pain Genitourinary:  [] Chronic kidney disease   [] Difficult urination  [] Frequent urination  [] Burning with urination   [] Hematuria Skin:  [] Rashes   [] Ulcers   [] Wounds Psychological:  [] History of anxiety   []  History of major depression.    Physical Exam BP 113/66 (BP Location: Left Arm)   Pulse (!) 56   Resp 15   Wt 170 lb (77.1 kg)   BMI 31.09 kg/m  Gen:  WD/WN, NAD Head: /AT, No temporalis wasting.  Ear/Nose/Throat: Hearing grossly intact, nares w/o erythema or drainage, oropharynx w/o Erythema/Exudate Eyes: Conjunctiva clear, sclera non-icteric  Neck: trachea midline.  No JVD.  Pulmonary:  Good air movement, respirations not labored, no use of accessory muscles  Cardiac: bradycardic Vascular:  Vessel Right Left  Radial Palpable Palpable                          PT NP NP  DP 1+ 1+   Gastrointestinal:. No masses, surgical incisions, or scars. Musculoskeletal: M/S 5/5 throughout.  Extremities without ischemic changes.  No deformity or atrophy. 2-3+ BLE edema. Neurologic: Sensation grossly intact in extremities.  Symmetrical.  Speech is fluent. Motor exam as listed above. Psychiatric: Judgment intact, Mood & affect appropriate for pt's clinical situation. Dermatologic: No rashes or ulcers noted.  No cellulitis or open wounds.    Radiology No results found.  Labs Recent Results (from the past 2160 hour(s))  Brain natriuretic peptide     Status: Abnormal   Collection Time: 10/22/22 11:30 AM  Result Value Ref Range   B Natriuretic Peptide 2,187.9 (H) 0.0 - 100.0 pg/mL    Comment: Performed at [facility], [address] [place], [place] [address]     Assessment/Plan:  Lymphedema Recommend:  I have had a long discussion with the patient regarding swelling and why it  causes symptoms.  Patient will begin wearing graduated compression on a daily basis a prescription was given.  She has been unable to wear conventional compression socks, she says she is going to try the zippered compression socks and potentially the Velcro juxta lite compression system.  The patient will  wear the stockings first thing in  the morning and removing them in the evening. The patient is instructed specifically not to sleep in the stockings.   In addition, behavioral modification will be initiated.  This will include frequent elevation, use of over the counter pain medications and exercise such as walking.  Consideration for a lymph pump will also be made based upon the effectiveness of conservative therapy.  This would help to improve the edema control and prevent sequela such as ulcers and infections   Patient should undergo duplex ultrasound of the venous system to ensure that DVT or reflux is not present.  The patient will follow-up with me after the ultrasound.   Hypercholesterolemia lipid control important in reducing the progression of atherosclerotic disease. Continue statin therapy   Chronic kidney disease, stage 3a (HCC) Can worsen LE swelling  Chronic systolic CHF (congestive heart failure) (HCC) Worsens LE swelling  Essential hypertension blood pressure control important in reducing the progression of atherosclerotic disease. On appropriate oral medications.      Festus Barren 12/10/2022, 8:44 AM   This note was created with Dragon medical transcription system.  Any errors from dictation are unintentional.

## 2022-12-10 DIAGNOSIS — I890 Lymphedema, not elsewhere classified: Secondary | ICD-10-CM | POA: Insufficient documentation

## 2022-12-10 NOTE — Assessment & Plan Note (Signed)
Can worsen LE swelling 

## 2022-12-10 NOTE — Assessment & Plan Note (Signed)
blood pressure control important in reducing the progression of atherosclerotic disease. On appropriate oral medications.  

## 2022-12-10 NOTE — Assessment & Plan Note (Signed)
Worsens LE swelling 

## 2022-12-10 NOTE — Assessment & Plan Note (Signed)
lipid control important in reducing the progression of atherosclerotic disease. Continue statin therapy  

## 2022-12-10 NOTE — Assessment & Plan Note (Signed)
Recommend:  I have had a long discussion with the patient regarding swelling and why it  causes symptoms.  Patient will begin wearing graduated compression on a daily basis a prescription was given.  She has been unable to wear conventional compression socks, she says she is going to try the zippered compression socks and potentially the Velcro juxta lite compression system.  The patient will  wear the stockings first thing in the morning and removing them in the evening. The patient is instructed specifically not to sleep in the stockings.   In addition, behavioral modification will be initiated.  This will include frequent elevation, use of over the counter pain medications and exercise such as walking.  Consideration for a lymph pump will also be made based upon the effectiveness of conservative therapy.  This would help to improve the edema control and prevent sequela such as ulcers and infections   Patient should undergo duplex ultrasound of the venous system to ensure that DVT or reflux is not present.  The patient will follow-up with me after the ultrasound.

## 2023-01-19 ENCOUNTER — Encounter (INDEPENDENT_AMBULATORY_CARE_PROVIDER_SITE_OTHER): Payer: Self-pay | Admitting: Vascular Surgery

## 2023-01-19 ENCOUNTER — Ambulatory Visit (INDEPENDENT_AMBULATORY_CARE_PROVIDER_SITE_OTHER): Payer: Medicare Other

## 2023-01-19 ENCOUNTER — Ambulatory Visit (INDEPENDENT_AMBULATORY_CARE_PROVIDER_SITE_OTHER): Payer: Medicare Other | Admitting: Vascular Surgery

## 2023-01-19 VITALS — BP 130/75 | HR 53 | Resp 18 | Ht 62.0 in | Wt 170.0 lb

## 2023-01-19 DIAGNOSIS — I890 Lymphedema, not elsewhere classified: Secondary | ICD-10-CM

## 2023-01-19 DIAGNOSIS — E7800 Pure hypercholesterolemia, unspecified: Secondary | ICD-10-CM

## 2023-01-19 DIAGNOSIS — I5022 Chronic systolic (congestive) heart failure: Secondary | ICD-10-CM

## 2023-01-19 DIAGNOSIS — N1831 Chronic kidney disease, stage 3a: Secondary | ICD-10-CM

## 2023-01-19 DIAGNOSIS — I83893 Varicose veins of bilateral lower extremities with other complications: Secondary | ICD-10-CM

## 2023-01-19 DIAGNOSIS — I10 Essential (primary) hypertension: Secondary | ICD-10-CM

## 2023-01-19 NOTE — Assessment & Plan Note (Signed)
A venous reflux study was performed today showing no DVT or superficial thrombophlebitis.  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 She has had marked improvement with diuresis and control of her cardiac issues.  At this point, we did discuss the role for laser ablation for reflux in the great saphenous vein.  I think it would be reasonable to give this a few more months to see how she does with medical management, but if she does not have continued improvement consideration for laser ablation will be given.  Compression socks, elevation, and increasing her activity is much as possible were recommended.  Follow-up in 3 months.

## 2023-01-19 NOTE — Progress Notes (Signed)
MRN : [id number]  Kristin Mitchell is a 82 y.o. (August 20, 1940) female who presents with chief complaint of  Chief Complaint  Patient presents with   Follow-up    pt conv Bil reflux  .  History of Present Illness: Patient returns today in follow up of leg swelling and pain as well as marked discoloration.  She has been getting treatment for her cardiac issues including irregular heartbeat and this has resulted in significant improvement in her lower swelling.  She is still noticeably swollen but much less so than before.  This is left some red marks on her right medial leg but no open wounds.  She still has marked stasis dermatitis changes present in both lower legs.  Her legs do feel better.  A venous reflux study was performed today showing no DVT or superficial thrombophlebitis.  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  Current Outpatient Medications  Medication Sig Dispense Refill   acetaminophen (TYLENOL) 500 MG tablet Take 500 mg by mouth every 6 (six) hours as needed.      alendronate (FOSAMAX) 70 MG tablet Take with a full glass of water on an empty stomach. (Patient taking differently: 70 mg once a week.) 12 tablet 0   amiodarone (PACERONE) 200 MG tablet Take 200 mg by mouth daily.     atorvastatin (LIPITOR) 80 MG tablet Take 1 tablet (80 mg total) by mouth daily. 30 tablet 0   Calcium Carb-Cholecalciferol (CALCIUM 600+D) 600-800 MG-UNIT TABS Take 1 tablet by mouth daily.     dapagliflozin propanediol (FARXIGA) 10 MG TABS tablet Take 1 tablet by mouth daily.     magnesium chloride (SLOW-MAG) 64 MG TBEC SR tablet Take 1 tablet (64 mg total) by mouth daily. 60 tablet 1   metoprolol succinate (TOPROL-XL) 50 MG 24 hr tablet Take 50 mg by mouth 2 (two) times daily.     metoprolol tartrate (LOPRESSOR) 100 MG tablet Take 1 tablet (100 mg total) by mouth 2 (two) times daily. 60 tablet 0   potassium chloride (KLOR-CON) 10 MEQ tablet  Take 10 mEq by mouth daily.     sodium chloride 1 g tablet Take 1 tablet (1 g total) by mouth daily.     torsemide (DEMADEX) 20 MG tablet Take 1 tablet (20 mg total) by mouth daily. 30 tablet 0   vitamin B-12 (CYANOCOBALAMIN) 1000 MCG tablet Take 1,000 mcg by mouth daily.     Cholecalciferol (VITAMIN D3) 50 MCG (2000 UT) TABS Take 1 tablet by mouth daily. (Patient not taking: Reported on 12/08/2022)     warfarin (COUMADIN) 2.5 MG tablet Take 1 tablet (2.5 mg total) by mouth daily. 30 tablet 0   No current facility-administered medications for this visit.    Past Medical History:  Diagnosis Date   A-fib [facility])    Arthritis    Breast cancer (HCC) 11/26/2014   Cancer (HCC)    breast   Intermittent palpitations    in early am    Past Surgical History:  Procedure Laterality Date   ABDOMINAL HYSTERECTOMY     CATARACT EXTRACTION W/PHACO Left 11/26/2014   Procedure: CATARACT EXTRACTION PHACO AND INTRAOCULAR LENS PLACEMENT (IOC);  Surgeon: Sallee Lange, MD;  Location: [facility] ORS;  Service: Ophthalmology;  Laterality: Left;  US:01:24 AP:26.6 CDE:40.16 PACk [id number] H   CHOLECYSTECTOMY     EYE SURGERY     MASTECTOMY     bilateral     Social History  Tobacco Use   Smoking status: Never   Smokeless tobacco: Never  Substance Use Topics   Alcohol use: No   Drug use: Never      Family History  Problem Relation Age of Onset   Parkinson's disease Mother    Hodgkin's lymphoma Father    Prostate cancer Neg Hx    Bladder Cancer Neg Hx    Kidney cancer Neg Hx      Allergies  Allergen Reactions   Penicillins Rash    REVIEW OF SYSTEMS (Negative unless checked)   Constitutional: [] Weight loss  [] Fever  [] Chills Cardiac: [] Chest pain   [] Chest pressure   [x] Palpitations   [] Shortness of breath when laying flat   [] Shortness of breath at rest   [] Shortness of breath with exertion. Vascular:  [] Pain in legs with walking   [x] Pain in legs at rest   [] Pain in legs when laying  flat   [] Claudication   [] Pain in feet when walking  [] Pain in feet at rest  [] Pain in feet when laying flat   [] History of DVT   [] Phlebitis   [x] Swelling in legs   [] Varicose veins   [] Non-healing ulcers Pulmonary:   [] Uses home oxygen   [] Productive cough   [] Hemoptysis   [] Wheeze  [] COPD   [] Asthma Neurologic:  [] Dizziness  [] Blackouts   [] Seizures   [] History of stroke   [] History of TIA  [] Aphasia   [] Temporary blindness   [] Dysphagia   [] Weakness or numbness in arms   [] Weakness or numbness in legs Musculoskeletal:  [x] Arthritis   [] Joint swelling   [x] Joint pain   [] Low back pain Hematologic:  [] Easy bruising  [] Easy bleeding   [] Hypercoagulable state   [] Anemic  [] Hepatitis Gastrointestinal:  [] Blood in stool   [] Vomiting blood  [] Gastroesophageal reflux/heartburn   [] Abdominal pain Genitourinary:  [] Chronic kidney disease   [] Difficult urination  [] Frequent urination  [] Burning with urination   [] Hematuria Skin:  [] Rashes   [] Ulcers   [] Wounds Psychological:  [] History of anxiety   []  History of major depression.  Physical Examination  BP 130/75 (BP Location: Left Arm)   Pulse (!) 53   Resp 18   Ht 5\' 2"  (1.575 m)   Wt 170 lb (77.1 kg)   BMI 31.09 kg/m  Gen:  WD/WN, NAD. Appears younger than stated age. Head: [place]/AT, No temporalis wasting. Ear/Nose/Throat: Hearing grossly intact, nares w/o erythema or drainage Eyes: Conjunctiva clear. Sclera non-icteric Neck: Supple.  Trachea midline Pulmonary:  Good air movement, no use of accessory muscles.  Cardiac: bradycardic Vascular:  Vessel Right Left  Radial Palpable Palpable           Musculoskeletal: M/S 5/5 throughout.  No deformity or atrophy.  Marked stasis dermatitis changes are present bilaterally.  2+ bilateral lower extremity edema. Neurologic: Sensation grossly intact in extremities.  Symmetrical.  Speech is fluent.  Psychiatric: Judgment intact, Mood & affect appropriate for pt's clinical situation. Dermatologic: No  rashes or ulcers noted.  No cellulitis or open wounds.      Labs Recent Results (from the past 2160 hour(s))  Brain natriuretic peptide     Status: Abnormal   Collection Time: 10/22/22 11:30 AM  Result Value Ref Range   B Natriuretic Peptide 2,187.9 (H) 0.0 - 100.0 pg/mL    Comment: Performed at [facility], [address] [place], [place] [address]    Radiology No results found.  Assessment/Plan Hypercholesterolemia lipid control important in reducing the progression of atherosclerotic disease. Continue statin therapy  Chronic kidney disease, stage 3a (HCC) Can worsen LE swelling   Chronic systolic CHF (congestive heart failure) (HCC) Worsens LE swelling   Essential hypertension blood pressure control important in reducing the progression of atherosclerotic disease. On appropriate oral medications.  Varicose veins of leg with swelling, bilateral A venous reflux study was performed today showing no DVT or superficial thrombophlebitis.  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 She has had marked improvement with diuresis and control of her cardiac issues.  At this point, we did discuss the role for laser ablation for reflux in the great saphenous vein.  I think it would be reasonable to give this a few more months to see how she does with medical management, but if she does not have continued improvement consideration for laser ablation will be given.  Compression socks, elevation, and increasing her activity is much as possible were recommended.  Follow-up in 3 months.    Festus Barren, MD  01/19/2023 4:15 PM    This note was created with Dragon medical transcription system.  Any errors from dictation are purely unintentional

## 2023-04-20 DIAGNOSIS — I951 Orthostatic hypotension: Secondary | ICD-10-CM | POA: Insufficient documentation

## 2023-04-27 ENCOUNTER — Ambulatory Visit (INDEPENDENT_AMBULATORY_CARE_PROVIDER_SITE_OTHER): Payer: Medicare Other | Admitting: Vascular Surgery

## 2023-04-27 VITALS — BP 105/60 | HR 60 | Resp 16 | Wt 148.0 lb

## 2023-04-27 DIAGNOSIS — I10 Essential (primary) hypertension: Secondary | ICD-10-CM

## 2023-04-27 DIAGNOSIS — N1831 Chronic kidney disease, stage 3a: Secondary | ICD-10-CM | POA: Diagnosis not present

## 2023-04-27 DIAGNOSIS — I5022 Chronic systolic (congestive) heart failure: Secondary | ICD-10-CM | POA: Diagnosis not present

## 2023-04-27 DIAGNOSIS — I83893 Varicose veins of bilateral lower extremities with other complications: Secondary | ICD-10-CM

## 2023-04-27 DIAGNOSIS — E7800 Pure hypercholesterolemia, unspecified: Secondary | ICD-10-CM

## 2023-04-27 NOTE — Progress Notes (Signed)
MRN : [id number]  Kristin Mitchell is a 82 y.o. (10-19-1940) female who presents with chief complaint of  Chief Complaint  Patient presents with   Follow-up    3-4 month follow up  .  History of Present Illness: Patient returns today in follow up of her leg swelling and venous insufficiency.  Her swelling is under fair control at this point.  Compression and elevation really have done a good job for her.  Diuresis has helped as well.  No new wounds or infection.  She does have pretty marked stasis dermatitis changes and hyperpigmentation on both legs.  A previous reflux study was positive for reflux in the great saphenous vein bilaterally.  No DVT or superficial thrombophlebitis.  Current Outpatient Medications  Medication Sig Dispense Refill   acetaminophen (TYLENOL) 500 MG tablet Take 500 mg by mouth every 6 (six) hours as needed.      alendronate (FOSAMAX) 70 MG tablet Take with a full glass of water on an empty stomach. (Patient taking differently: 70 mg once a week.) 12 tablet 0   amiodarone (PACERONE) 200 MG tablet Take 200 mg by mouth daily.     atorvastatin (LIPITOR) 80 MG tablet Take 1 tablet (80 mg total) by mouth daily. 30 tablet 0   Calcium Carb-Cholecalciferol (CALCIUM 600+D) 600-800 MG-UNIT TABS Take 1 tablet by mouth daily.     dapagliflozin propanediol (FARXIGA) 10 MG TABS tablet Take 1 tablet by mouth daily.     magnesium chloride (SLOW-MAG) 64 MG TBEC SR tablet Take 1 tablet (64 mg total) by mouth daily. 60 tablet 1   metoprolol succinate (TOPROL-XL) 50 MG 24 hr tablet Take 50 mg by mouth 2 (two) times daily.     metoprolol tartrate (LOPRESSOR) 100 MG tablet Take 1 tablet (100 mg total) by mouth 2 (two) times daily. 60 tablet 0   potassium chloride (KLOR-CON) 10 MEQ tablet Take 10 mEq by mouth daily.     sodium chloride 1 g tablet Take 1 tablet (1 g total) by mouth daily.     torsemide (DEMADEX) 20 MG tablet Take 1 tablet (20 mg total) by mouth daily. 30 tablet 0    vitamin B-12 (CYANOCOBALAMIN) 1000 MCG tablet Take 1,000 mcg by mouth daily.     warfarin (COUMADIN) 2.5 MG tablet Take 1 tablet (2.5 mg total) by mouth daily. 30 tablet 0   Cholecalciferol (VITAMIN D3) 50 MCG (2000 UT) TABS Take 1 tablet by mouth daily. (Patient not taking: Reported on 12/08/2022)     No current facility-administered medications for this visit.    Past Medical History:  Diagnosis Date   A-fib [facility])    Arthritis    Breast cancer (HCC) 11/26/2014   Cancer (HCC)    breast   Intermittent palpitations    in early am    Past Surgical History:  Procedure Laterality Date   ABDOMINAL HYSTERECTOMY     CATARACT EXTRACTION W/PHACO Left 11/26/2014   Procedure: CATARACT EXTRACTION PHACO AND INTRAOCULAR LENS PLACEMENT (IOC);  Surgeon: Sallee Lange, MD;  Location: [facility] ORS;  Service: Ophthalmology;  Laterality: Left;  US:01:24 AP:26.6 CDE:40.16 PACk [id number] H   CHOLECYSTECTOMY     EYE SURGERY     MASTECTOMY     bilateral     Social History   Tobacco Use   Smoking status: Never   Smokeless tobacco: Never  Substance Use Topics   Alcohol use: No   Drug use: Never      Family History  Problem Relation Age of Onset   Parkinson's disease Mother    Hodgkin's lymphoma Father    Prostate cancer Neg Hx    Bladder Cancer Neg Hx    Kidney cancer Neg Hx      Allergies  Allergen Reactions   Penicillins Rash     REVIEW OF SYSTEMS (Negative unless checked)   Constitutional: [] Weight loss  [] Fever  [] Chills Cardiac: [] Chest pain   [] Chest pressure   [x] Palpitations   [] Shortness of breath when laying flat   [] Shortness of breath at rest   [] Shortness of breath with exertion. Vascular:  [] Pain in legs with walking   [x] Pain in legs at rest   [] Pain in legs when laying flat   [] Claudication   [] Pain in feet when walking  [] Pain in feet at rest  [] Pain in feet when laying flat   [] History of DVT   [] Phlebitis   [x] Swelling in legs   [] Varicose veins    [] Non-healing ulcers Pulmonary:   [] Uses home oxygen   [] Productive cough   [] Hemoptysis   [] Wheeze  [] COPD   [] Asthma Neurologic:  [] Dizziness  [] Blackouts   [] Seizures   [] History of stroke   [] History of TIA  [] Aphasia   [] Temporary blindness   [] Dysphagia   [] Weakness or numbness in arms   [] Weakness or numbness in legs Musculoskeletal:  [x] Arthritis   [] Joint swelling   [x] Joint pain   [] Low back pain Hematologic:  [] Easy bruising  [] Easy bleeding   [] Hypercoagulable state   [] Anemic  [] Hepatitis Gastrointestinal:  [] Blood in stool   [] Vomiting blood  [] Gastroesophageal reflux/heartburn   [] Abdominal pain Genitourinary:  [] Chronic kidney disease   [] Difficult urination  [] Frequent urination  [] Burning with urination   [] Hematuria Skin:  [] Rashes   [] Ulcers   [] Wounds Psychological:  [] History of anxiety   []  History of major depression.  Physical Examination  BP 105/60   Pulse 60   Resp 16   Wt 148 lb (67.1 kg)   BMI 27.07 kg/m  Gen:  WD/WN, NAD Head: [place]/AT, No temporalis wasting. Ear/Nose/Throat: Hearing grossly intact, nares w/o erythema or drainage Eyes: Conjunctiva clear. Sclera non-icteric Neck: Supple.  Trachea midline Pulmonary:  Good air movement, no use of accessory muscles.  Cardiac: irregular Vascular:  Vessel Right Left  Radial Palpable Palpable           Musculoskeletal: M/S 5/5 throughout.  No deformity or atrophy.  Moderate to severe stasis dermatitis changes and hyperpigmentation bilaterally.  1+ bilateral lower extremity edema. Neurologic: Sensation grossly intact in extremities.  Symmetrical.  Speech is fluent.  Psychiatric: Judgment intact, Mood & affect appropriate for pt's clinical situation. Dermatologic: No rashes or ulcers noted.  No cellulitis or open wounds.      Labs No results found for this or any previous visit (from the past 2160 hours).  Radiology No results found.  Assessment/Plan  Varicose veins of leg with swelling,  bilateral Her swelling is currently under pretty good control and she states that she can live with this.  She does not want to have any procedures at that time and this is certainly reasonable.  She will continue with her conservative measures of elevation, exercise, and compression socks.  I will plan to see her back in about 6 months.  Hypercholesterolemia lipid control important in reducing the progression of atherosclerotic disease. Continue statin therapy     Chronic kidney disease, stage 3a (HCC) Can worsen LE swelling   Chronic systolic CHF (congestive heart failure) (HCC)  Worsens LE swelling   Essential hypertension blood pressure control important in reducing the progression of atherosclerotic disease. On appropriate oral medications.  Festus Barren, MD  04/27/2023 4:42 PM    This note was created with Dragon medical transcription system.  Any errors from dictation are purely unintentional

## 2023-04-27 NOTE — Assessment & Plan Note (Signed)
Her swelling is currently under pretty good control and she states that she can live with this.  She does not want to have any procedures at that time and this is certainly reasonable.  She will continue with her conservative measures of elevation, exercise, and compression socks.  I will plan to see her back in about 6 months.

## 2023-10-01 NOTE — Discharge Instructions (Addendum)
 Instructions after Total Knee Replacement   Emmilia Sowder P. Angie Fava., M.D.    Dept. of Orthopaedics & Sports Medicine [facility] [address] [place], [place]  [address]  Phone: [phone]   Fax: [phone]       [email or web address]       DIET: Drink plenty of non-alcoholic fluids. Resume your normal diet. Include foods high in fiber.  ACTIVITY:  You may use crutches or a walker with weight-bearing as tolerated, unless instructed otherwise. You may be weaned off of the walker or crutches by your Physical Therapist.  Do NOT place pillows under the knee. Anything placed under the knee could limit your ability to straighten the knee.   Use the Bone Foam 3 times a day for 30 minutes each session to help straighten the knee. Continue doing gentle exercises. Exercising will reduce the pain and swelling, increase motion, and prevent muscle weakness.   Please continue to use the TED compression stockings for 6 weeks. You may remove the stockings at night, but should reapply them in the morning. Do not drive or operate any equipment until instructed.  WOUND CARE:  The initial dressing (Aquacel) can remain in place for 7 days (see separate instructions). Continue to use the PolarCare or ice packs periodically to reduce pain and swelling. You may bathe or shower after the staples are removed at the first office visit following surgery.  MEDICATIONS: You may resume your regular medications. Please take the pain medication as prescribed on the medication. Do not take pain medication on an empty stomach. Unless instructed otherwise, you should take an enteric-coated aspirin 81 mg. TWICE a day. (This along with elevation will help reduce the possibility of blood clots/phlebitis in your operated leg.) Use a stool softener (such as Senokot-S or Colace) daily and a laxative (such as Miralax or Dulcolax) as needed to prevent constipation.  Do not drive or drink alcoholic beverages when  taking pain medications.  CALL THE OFFICE FOR: Temperature above 101 degrees Excessive bleeding or drainage on the dressing. Excessive swelling, coldness, or paleness of the toes. Persistent nausea and vomiting.  FOLLOW-UP:  You should have an appointment to return to the office in 10-14 days after surgery. Arrangements have been made for continuation of Physical Therapy (either home therapy or outpatient therapy).     [facility] Department Directory         [email or web address]       [email or web address]          Cardiology  Appointments: [place] - [phone]  Endocrinology  Appointments: [place] [phone] [place] - [phone]  Gastroenterology  Appointments: [employer] [phone] [place] - [phone]        General Surgery   Appointments: [facility]  Internal Medicine/Family Medicine  Appointments: [facility] [place] - [phone] [place] - [phone]  Metabolic and Weigh Loss Surgery  Appointments: [facility]        Neurology  Appointments: [place] [phone] [place] - [phone]  Neurosurgery  Appointments: [place]  Obstetrics & Gynecology  Appointments: [place] [phone] [place] - [phone]        Pediatrics  Appointments: [place] [phone] [place] - [phone]  Physiatry  Appointments: [place] [phone]  Physical Therapy  Appointments: [place] - [phone]        Podiatry  Appointments: [place] [phone] [place] - [phone]  Pulmonology  Appointments: [facility]  Rheumatology  Appointments: [place] [phone]  [place] Location: [facility]  [address] [place], [place]  [address]  [place] Location: [facility]. [address] [place], [place]  [address]  [place]  Location: [facility] [address] [place], [place]  [address]

## 2023-10-06 ENCOUNTER — Other Ambulatory Visit: Payer: Self-pay

## 2023-10-06 ENCOUNTER — Encounter
Admission: RE | Admit: 2023-10-06 | Discharge: 2023-10-06 | Disposition: A | Discharge status: Home / Self Care | Source: Ambulatory Visit | Attending: Orthopedic Surgery | Admitting: Orthopedic Surgery

## 2023-10-06 ENCOUNTER — Encounter: Payer: Self-pay | Admitting: Orthopedic Surgery

## 2023-10-06 DIAGNOSIS — E871 Hypo-osmolality and hyponatremia: Secondary | ICD-10-CM

## 2023-10-06 DIAGNOSIS — I5022 Chronic systolic (congestive) heart failure: Secondary | ICD-10-CM | POA: Diagnosis not present

## 2023-10-06 DIAGNOSIS — N1831 Chronic kidney disease, stage 3a: Secondary | ICD-10-CM | POA: Diagnosis not present

## 2023-10-06 DIAGNOSIS — G459 Transient cerebral ischemic attack, unspecified: Secondary | ICD-10-CM

## 2023-10-06 DIAGNOSIS — Z01818 Encounter for other preprocedural examination: Secondary | ICD-10-CM | POA: Diagnosis present

## 2023-10-06 DIAGNOSIS — M1712 Unilateral primary osteoarthritis, left knee: Secondary | ICD-10-CM

## 2023-10-06 DIAGNOSIS — I739 Peripheral vascular disease, unspecified: Secondary | ICD-10-CM

## 2023-10-06 DIAGNOSIS — I4891 Unspecified atrial fibrillation: Secondary | ICD-10-CM

## 2023-10-06 DIAGNOSIS — Z01812 Encounter for preprocedural laboratory examination: Secondary | ICD-10-CM | POA: Diagnosis not present

## 2023-10-06 DIAGNOSIS — I480 Paroxysmal atrial fibrillation: Secondary | ICD-10-CM | POA: Diagnosis not present

## 2023-10-06 DIAGNOSIS — Z880 Allergy status to penicillin: Secondary | ICD-10-CM

## 2023-10-06 DIAGNOSIS — D649 Anemia, unspecified: Secondary | ICD-10-CM

## 2023-10-06 DIAGNOSIS — I482 Chronic atrial fibrillation, unspecified: Secondary | ICD-10-CM | POA: Diagnosis not present

## 2023-10-06 DIAGNOSIS — E876 Hypokalemia: Secondary | ICD-10-CM

## 2023-10-06 LAB — C-REACTIVE PROTEIN: CRP: 0.7 mg/dL (ref <1.0)

## 2023-10-06 LAB — SEDIMENTATION RATE: Sed Rate: 22 mm/h (ref 0–30)

## 2023-10-06 LAB — URINALYSIS, ROUTINE W REFLEX MICROSCOPIC
Bilirubin Urine: NEGATIVE
Glucose, UA: NEGATIVE mg/dL
Hgb urine dipstick: NEGATIVE
Ketones, ur: NEGATIVE mg/dL
Nitrite: NEGATIVE
Protein, ur: NEGATIVE mg/dL
Specific Gravity, Urine: 1.018 (ref 1.005–1.030)
WBC, UA: >50 WBC/hpf (ref 0–5)
pH: 5 (ref 5.0–8.0)

## 2023-10-06 LAB — CBC
HCT: 34.5 % — ABNORMAL LOW (ref 36.0–46.0)
Hemoglobin: 11.1 g/dL — ABNORMAL LOW (ref 12.0–15.0)
MCH: 30.8 pg (ref 26.0–34.0)
MCHC: 32.2 g/dL (ref 30.0–36.0)
MCV: 95.8 fL (ref 80.0–100.0)
Platelets: 171 10*3/uL (ref 150–400)
RBC: 3.6 MIL/uL — ABNORMAL LOW (ref 3.87–5.11)
RDW: 14.2 % (ref 11.5–15.5)
WBC: 6.8 10*3/uL (ref 4.0–10.5)
nRBC: 0 % (ref 0.0–0.2)

## 2023-10-06 LAB — COMPREHENSIVE METABOLIC PANEL WITH GFR
ALT: 18 U/L (ref 0–44)
AST: 27 U/L (ref 15–41)
Albumin: 3.6 g/dL (ref 3.5–5.0)
Alkaline Phosphatase: 96 U/L (ref 38–126)
Anion gap: 10 (ref 5–15)
BUN: 20 mg/dL (ref 8–23)
CO2: 22 mmol/L (ref 22–32)
Calcium: 9.1 mg/dL (ref 8.9–10.3)
Chloride: 102 mmol/L (ref 98–111)
Creatinine, Ser: 1.1 mg/dL — ABNORMAL HIGH (ref 0.44–1.00)
GFR, Estimated: 50 mL/min — ABNORMAL LOW (ref >60)
Glucose, Bld: 71 mg/dL (ref 70–99)
Potassium: 4.3 mmol/L (ref 3.5–5.1)
Sodium: 134 mmol/L — ABNORMAL LOW (ref 135–145)
Total Bilirubin: 0.9 mg/dL (ref 0.0–1.2)
Total Protein: 7.1 g/dL (ref 6.5–8.1)

## 2023-10-06 LAB — SURGICAL PCR SCREEN
MRSA, PCR: NEGATIVE
Staphylococcus aureus: NEGATIVE

## 2023-10-06 NOTE — Patient Instructions (Addendum)
 Your procedure is scheduled on: Wednesday, June 4,2025 Report to the Registration Desk on the 1st floor of the [facility] To find out your arrival time, please call [phone] between 1PM - 3PM on: Tuesday, June 3 If your arrival time is 6:00 am, do not arrive before that time as the [facility] entrance doors do not open until 6:00 am.  REMEMBER: Instructions that are not followed completely may result in serious medical risk, up to and including death; or upon the discretion of your surgeon and anesthesiologist your surgery may need to be rescheduled.  Do not eat food after midnight the night before surgery.  No gum chewing or hard candies.  You may however, drink CLEAR liquids up to 2 hours before you are scheduled to arrive for your surgery. Do not drink anything within 2 hours of your scheduled arrival time.  Clear liquids include: - water  - apple juice without pulp - gatorade (not RED colors) - black coffee or tea (Do NOT add milk or creamers to the coffee or tea) Do NOT drink anything that is not on this list.    In addition, your doctor has ordered for you to drink the provided:  Ensure Pre-Surgery Clear Carbohydrate Drink   Drinking this carbohydrate drink up to two hours before surgery helps to reduce insulin resistance and improve patient outcomes. Please complete drinking 2 hours before scheduled arrival time.  One week prior to surgery: Stop Anti-inflammatories (NSAIDS) such as Advil, Aleve, Ibuprofen, Motrin, Naproxen, Naprosyn and Aspirin  based products such as Excedrin, Goody's Powder, BC Powder. Stop ANY OVER THE COUNTER supplements until after surgery.  You may however, continue to take Tylenol  if needed for pain up until the day of surgery.    Please inform the patient to stop COUMADIN  5 days prior to surgery (last dose on 10/07/2023) This is Dr. Dillard Frame order.   Continue taking all of your other prescription medications up until the day of  surgery.  ON THE DAY OF SURGERY ONLY TAKE THESE MEDICATIONS WITH SIPS OF WATER:  amiodarone (PACERONE) 200 MG tablet atorvastatin  (LIPITOR ) metoprolol  succinate (TOPROL -XL) potassium chloride  (KLOR-CON ) sodium chloride      No Alcohol for 24 hours before or after surgery.  No Smoking including e-cigarettes for 24 hours before surgery.  No chewable tobacco products for at least 6 hours before surgery.  No nicotine patches on the day of surgery.  Do not use any "recreational" drugs for at least a week (preferably 2 weeks) before your surgery.  Please be advised that the combination of cocaine and anesthesia may have negative outcomes, up to and including death. If you test positive for cocaine, your surgery will be cancelled.  On the morning of surgery brush your teeth with toothpaste and water, you may rinse your mouth with mouthwash if you wish. Do not swallow any toothpaste or mouthwash.  Use CHG Soap or wipes as directed on instruction sheet.-provided for you  Do not wear jewelry, make-up, hairpins, clips or nail polish.  For welded (permanent) jewelry: bracelets, anklets, waist bands, etc.  Please have this removed prior to surgery.  If it is not removed, there is a chance that hospital personnel will need to cut it off on the day of surgery.  Do not wear lotions, powders, or perfumes.   Do not shave body hair from the neck down 48 hours before surgery.  Contact lenses, hearing aids and dentures may not be worn into surgery.  Do not bring valuables to  the hospital. [facility] is not responsible for any missing/lost belongings or valuables.     Notify your doctor if there is any change in your medical condition (cold, fever, infection).  Wear comfortable clothing (specific to your surgery type) to the hospital.  After surgery, you can help prevent lung complications by doing breathing exercises.  Take deep breaths and cough every 1-2 hours. Your doctor may order a  device called an Incentive Spirometer to help you take deep breaths.  If you are being admitted to the hospital overnight, leave your suitcase in the car. After surgery it may be brought to your room.  In case of increased patient census, it may be necessary for you, the patient, to continue your postoperative care in the Same Day Surgery department.  If you are being discharged the day of surgery, you will not be allowed to drive home. You will need a responsible individual to drive you home and stay with you for 24 hours after surgery.     Please call the Pre-admissions Testing Dept. at [phone] if you have any questions about these instructions.  Surgery Visitation Policy:  Patients having surgery or a procedure may have two visitors.  Children under the age of 9 must have an adult with them who is not the patient.  Inpatient Visitation:    Visiting hours are 7 a.m. to 8 p.m. Up to four visitors are allowed at one time in a patient room. The visitors may rotate out with other people during the day.  One visitor age 60 or older may stay with the patient overnight and must be in the room by 8 p.m.   Preparing the Skin Before Surgery     To help prevent the risk of infection at your surgical site, we are now providing you with rinse-free Sage 2% Chlorhexidine Gluconate (CHG) disposable wipes.  Chlorhexidine Gluconate (CHG) Soap  o An antiseptic cleaner that kills germs and bonds with the skin to continue killing germs even after washing  o Used for showering the night before surgery and morning of surgery  The night before surgery: Shower or bathe with warm water. Do not apply perfume, lotions, powders. Wait one hour after shower. Skin should be dry and cool. Open Sage wipe package - use 6 disposable cloths. Wipe body using one cloth for the right arm, one cloth for the left arm, one cloth for the right leg, one cloth for the left leg, one cloth for the chest/abdomen  area, and one cloth for the back. Do not use on open wounds or sores. Do not use on face or genitals (private parts). If you are breast feeding, do not use on breasts. 5. Do not rinse, allow to dry. 6. Skin may feel "tacky" for several minutes. 7. Dress in clean clothes. 8. Place clean sheets on your bed and do not sleep with pets.  REPEAT ABOVE ON THE MORNING OF SURGERY BEFORE ARRIVING TO THE HOSPITAL.     How to Use an Incentive Spirometer  An incentive spirometer is a tool that measures how well you are filling your lungs with each breath. Learning to take long, deep breaths using this tool can help you keep your lungs clear and active. This may help to reverse or lessen your chance of developing breathing (pulmonary) problems, especially infection. You may be asked to use a spirometer: After a surgery. If you have a lung problem or a history of smoking. After a long period of time  when you have been unable to move or be active. If the spirometer includes an indicator to show the highest number that you have reached, your health care provider or respiratory therapist will help you set a goal. Keep a log of your progress as told by your health care provider. What are the risks? Breathing too quickly may cause dizziness or cause you to pass out. Take your time so you do not get dizzy or light-headed. If you are in pain, you may need to take pain medicine before doing incentive spirometry. It is harder to take a deep breath if you are having pain. How to use your incentive spirometer  Sit up on the edge of your bed or on a chair. Hold the incentive spirometer so that it is in an upright position. Before you use the spirometer, breathe out normally. Place the mouthpiece in your mouth. Make sure your lips are closed tightly around it. Breathe in slowly and as deeply as you can through your mouth, causing the piston or the ball to rise toward the top of the chamber. Hold your breath for  3-5 seconds, or for as long as possible. If the spirometer includes a coach indicator, use this to guide you in breathing. Slow down your breathing if the indicator goes above the marked areas. Remove the mouthpiece from your mouth and breathe out normally. The piston or ball will return to the bottom of the chamber. Rest for a few seconds, then repeat the steps 10 or more times. Take your time and take a few normal breaths between deep breaths so that you do not get dizzy or light-headed. Do this every 1-2 hours when you are awake. If the spirometer includes a goal marker to show the highest number you have reached (best effort), use this as a goal to work toward during each repetition. After each set of 10 deep breaths, cough a few times. This will help to make sure that your lungs are clear. If you have an incision on your chest or abdomen from surgery, place a pillow or a rolled-up towel firmly against the incision when you cough. This can help to reduce pain while taking deep breaths and coughing. General tips When you are able to get out of bed: Walk around often. Continue to take deep breaths and cough in order to clear your lungs. Keep using the incentive spirometer until your health care provider says it is okay to stop using it. If you have been in the hospital, you may be told to keep using the spirometer at home. Contact a health care provider if: You are having difficulty using the spirometer. You have trouble using the spirometer as often as instructed. Your pain medicine is not giving enough relief for you to use the spirometer as told. You have a fever. Get help right away if: You develop shortness of breath. You develop a cough with bloody mucus from the lungs. You have fluid or blood coming from an incision site after you cough. Summary An incentive spirometer is a tool that can help you learn to take long, deep breaths to keep your lungs clear and active. You may be asked  to use a spirometer after a surgery, if you have a lung problem or a history of smoking, or if you have been inactive for a long period of time. Use your incentive spirometer as instructed every 1-2 hours while you are awake. If you have an incision on your chest or abdomen,  place a pillow or a rolled-up towel firmly against your incision when you cough. This will help to reduce pain. Get help right away if you have shortness of breath, you cough up bloody mucus, or blood comes from your incision when you cough. This information is not intended to replace advice given to you by your health care provider. Make sure you discuss any questions you have with your health care provider. Document Revised: 07/17/2019 Document Reviewed: 07/17/2019 Elsevier Patient Education  2023 [employer].

## 2023-10-09 LAB — IGE: IgE (Immunoglobulin E), Serum: 49 [IU]/mL (ref 6–495)

## 2023-10-10 ENCOUNTER — Encounter: Payer: Self-pay | Admitting: Orthopedic Surgery

## 2023-10-12 ENCOUNTER — Encounter: Payer: Self-pay | Admitting: Orthopedic Surgery

## 2023-10-12 MED ORDER — LACTATED RINGERS IV SOLN: INTRAVENOUS | Status: DC

## 2023-10-12 MED ORDER — CEFAZOLIN SODIUM-DEXTROSE 2-4 GM/100ML-% IV SOLN
2.0000 g | INTRAVENOUS | Status: AC
  Administered 2023-10-13: 2 g via INTRAVENOUS

## 2023-10-12 MED ORDER — TRANEXAMIC ACID-NACL 1000-0.7 MG/100ML-% IV SOLN: 1000.0000 mg | INTRAVENOUS | Status: DC

## 2023-10-12 MED ORDER — CHLORHEXIDINE GLUCONATE 0.12 % MT SOLN
15.0000 mL | Freq: Once | OROMUCOSAL | Status: AC
Start: 2023-10-12 — End: 2023-10-13
  Administered 2023-10-13: 15 mL via OROMUCOSAL

## 2023-10-12 MED ORDER — ORAL CARE MOUTH RINSE: 15.0000 mL | Freq: Once | OROMUCOSAL | Status: AC

## 2023-10-12 MED ORDER — GABAPENTIN 300 MG PO CAPS
300.0000 mg | ORAL_CAPSULE | Freq: Once | ORAL | Status: AC
  Administered 2023-10-13: 300 mg via ORAL

## 2023-10-12 MED ORDER — CELECOXIB 200 MG PO CAPS
400.0000 mg | ORAL_CAPSULE | Freq: Once | ORAL | Status: AC
  Administered 2023-10-13: 400 mg via ORAL

## 2023-10-12 MED ORDER — DEXAMETHASONE SODIUM PHOSPHATE 10 MG/ML IJ SOLN
8.0000 mg | Freq: Once | INTRAMUSCULAR | Status: AC
  Administered 2023-10-13: 8 mg via INTRAVENOUS

## 2023-10-12 MED ORDER — CHLORHEXIDINE GLUCONATE 4 % EX SOLN
60.0000 mL | Freq: Once | CUTANEOUS | Status: AC
  Administered 2023-10-13: 4 via TOPICAL

## 2023-10-12 NOTE — Progress Notes (Signed)
 Perioperative / Anesthesia Services  Pre-Admission Testing Clinical Review / Pre-Operative Anesthesia Consult  Date: 10/12/23  PATIENT DEMOGRAPHICS: Name: Kristin Mitchell DOB: [birth year] MRN:   [id number]  Note: Available PAT nursing documentation and vital signs have been reviewed. Clinical nursing staff has updated patient's PMH/PSHx, current medication list, and drug allergies/intolerances to ensure complete and comprehensive history available to assist care teams in MDM as it pertains to the aforementioned surgical procedure and anticipated anesthetic course. Extensive review of available clinical information personally performed. [facility] and PSHx updated with any diagnoses/procedures that  may have been inadvertently omitted during his intake with the pre-admission testing department's nursing staff.  PLANNED SURGICAL PROCEDURE(S):    Case: [id number] Date/Time: 10/13/23 0815   Procedure: ARTHROPLASTY, KNEE, TOTAL, USING IMAGELESS COMPUTER-ASSISTED NAVIGATION (Left: Knee)   Anesthesia type: Choice   Diagnosis: Primary osteoarthritis of left knee [M17.12]   Pre-op diagnosis: Primary osteoarthritis of left knee   Location: [facility] OR ROOM 01 / [facility]   Surgeons: Arlyne Lame, MD     CLINICAL DISCUSSION: Kristin Mitchell is a 83 y.o. female who is submitted for pre-surgical anesthesia review and clearance prior to her undergoing the above procedure. Patient has never been a smoker in the past. Pertinent PMH includes: HFrEF, persistent atrial fibrillation, valvular insufficiency, mitral valve prolapse, CVA, cerebral microvascular disease, aortic atherosclerosis, intermittent palpitations, LBBB, HTN, HLD, CKD-III, anemia, cervical DDD with spinal stenosis, lumbar spinal stenosis with neurogenic claudication, OA.  Patient is followed by cardiology Braxton Calico, MD). She was last seen in the cardiology clinic on 07/06/2023; notes reviewed. At the time of  her clinic visit, patient doing well overall from a cardiovascular perspective. Patient denied any chest pain, shortness of breath, PND, orthopnea, palpitations, significant peripheral edema, weakness, fatigue, vertiginous symptoms, or presyncope/syncope. Patient with a past medical history significant for cardiovascular diagnoses. Documented physical exam was grossly benign, providing no evidence of acute exacerbation and/or decompensation of the patient's known cardiovascular conditions.  Patient with an atrial fibrillation diagnosis; CHA2DS2-VASc Score = 6 ((age x 2, sex, HFrEF, HTN, vascular disease ). Her rate and rhythm are currently being maintained on oral amiodarone + metoprolol . She is chronically anticoagulated using warfarin; reported to be compliant with therapy with no evidence or reports of GI/GU bleeding.  Blood pressure well controlled at 122/70 mmHg on currently prescribed beta-blocker (metoprolol ) and diuretic (torsemide ) therapies. She is on atorvastatin  for her HLD diagnosis and further ASCVD prevention.  Patient is not diabetic.  With that said, in efforts to provide added cardiovascular and renovascular protection, patient is on an SGLT2i (dapagliflozin).  Patient does not have an OSAH diagnosis.  Functional capacity somewhat limited by patient's age, medical comorbidities, and orthopedic pain.  With that said, she is able to complete all of her ADL/IADLs without cardiovascular limitation.  Per the DASI, patient felt to be able to achieve at least 4 METS physical activity without experiencing any significant degree of angina/anginal equivalent symptoms.  In the setting of upcoming orthopedic surgery, given patient's cardiovascular history, repeat echocardiogram was ordered.  No changes were made to her medication regimen.  Patient to follow-up with outpatient cardiology in 6 months or sooner if needed.  Since patient was last seen by cardiology, patient has undergone the recommended  noninvasive cardiovascular testing.  Most recent TTE performed on 07/27/2023 revealed a mildly reduced left ventricular systolic function with an EF of 45%. There was mild LVH.  Global hypokinesis was observed.  The left ventricular  diastolic Doppler parameters consistent with abnormal relaxation (G1DD). Right ventricular size and function normal with a TAPSE measuring 1.4 cm  (normal range >/= 1.6 cm).  RVSP = 37 mmHg. Left atrium was severely enlarged. There was severe mitral annular calcification. Aortic valve sclerosis was observed. There was mild aortic and moderate pulmonary, mitral, and tricuspid valve regurgitation. All transvalvular gradients were noted to be normal providing no evidence suggestive of valvular stenosis. Aorta normal in size with no evidence of ectasia or aneurysmal dilatation.  Kristin Mitchell is scheduled for an elective ARTHROPLASTY, KNEE, TOTAL, USING IMAGELESS COMPUTER-ASSISTED NAVIGATION (Left: Knee) on 10/13/2023 with Dr. Alveria Johann, MD.  Given patient's past medical history significant for cardiovascular diagnoses, presurgical cardiac clearance was sought by the PAT team. Per cardiology, "this patient is optimized for surgery and may proceed with the planned procedural course with a MODERATE risk of significant perioperative cardiovascular complications".  Again, this patient is on daily oral anticoagulation therapy. She has been instructed on recommendations for holding her warfarin for 5 days prior to her procedure with plans to restart as soon as postoperative bleeding risk felt to be minimized by his primary attending surgeon. The patient has been instructed that her last dose of should be on 10/07/2023.  She indicated the patient will require enoxaparin bridging surrounding this procedure.  Office to contact patient to discuss holding anticoagulation therapy and need for periprocedural bridging.  Patient denies previous perioperative complications with anesthesia  in the past. In review her EMR, it is noted that patient underwent a general anesthetic course at [facility] ([facility]) in 08/2018 without documented complications.   MOST RECENT VITAL SIGNS:    10/06/2023   11:00 AM 10/06/2023   10:58 AM 04/27/2023    2:38 PM  Vitals with BMI  Height 5\' 2"     Weight 137 lbs  148 lbs  BMI 25.05    Systolic  135 105  Diastolic  65 60  Pulse  54 60   PROVIDERS/SPECIALISTS: NOTE: Primary physician provider listed below. Patient may have been seen by APP or partner within same practice.   PROVIDER ROLE / SPECIALTY LAST OV  Kristin Mitchell, Kristin Chiles, MD Orthopedics (Surgeon) 10/05/2023  Kristin Grange, NP Primary Care Provider 04/20/2023  Kristin Manzanilla, DO Cardiology 07/06/2023   ALLERGIES: Allergies  Allergen Reactions   Penicillins Rash    IgE = 49 (WNL) on 10/06/2023   CURRENT HOME MEDICATIONS: No current facility-administered medications for this encounter.    acetaminophen  (TYLENOL ) 500 MG tablet   alendronate  (FOSAMAX ) 70 MG tablet   amiodarone (PACERONE) 200 MG tablet   atorvastatin  (LIPITOR ) 80 MG tablet   Calcium  Carb-Cholecalciferol  (CALCIUM  600+D) 600-800 MG-UNIT TABS   Cholecalciferol  (VITAMIN D3) 50 MCG (2000 UT) TABS   dapagliflozin propanediol (FARXIGA) 10 MG TABS tablet   magnesium  chloride (SLOW-MAG) 64 MG TBEC SR tablet   metoprolol  succinate (TOPROL -XL) 50 MG 24 hr tablet   metoprolol  tartrate (LOPRESSOR ) 100 MG tablet   potassium chloride  (KLOR-CON ) 10 MEQ tablet   sodium chloride  1 g tablet   torsemide  (DEMADEX ) 20 MG tablet   vitamin B-12 (CYANOCOBALAMIN ) 1000 MCG tablet   warfarin (COUMADIN ) 2.5 MG tablet   HISTORY: Past Medical History:  Diagnosis Date   Anemia    Aortic atherosclerosis (HCC)    Arthritis    Breast cancer, right (HCC) 11/26/2014   a.) s/p BILATERAL mastectomies   Cerebral microvascular disease    CKD (chronic kidney disease), stage  II    DDD (degenerative  disc disease), cervical    HFrEF (heart failure with reduced ejection fraction) (HCC)    HLD (hyperlipidemia)    HTN (hypertension)    Intermittent palpitations    Long term current use of amiodarone    Lumbar stenosis with neurogenic claudication    Mitral valve disease    MVP (mitral valve prolapse)    OAB (overactive bladder)    On warfarin for atrial fibrillation (HCC)    Osteoporosis    Persistent atrial fibrillation (HCC)    a.) CHA2DS2VASc = 6 (age x2, sex, HFrEF, HTN, vascular disease) as of 10/12/2023; b.) rate/rhythm maintained on oral amiodarone + metoprolol ; chronically anticoagulated with warfarin   Seasonal allergies    Senile cataract    Stroke [facility])    a.) noted on brain MRI performed on 03/28/2022: chronic RIGHT parietal lobe infarcts   Past Surgical History:  Procedure Laterality Date   ABDOMINAL HYSTERECTOMY     CATARACT EXTRACTION W/PHACO Left 11/26/2014   Procedure: CATARACT EXTRACTION PHACO AND INTRAOCULAR LENS PLACEMENT (IOC);  Surgeon: Steven Dingeldein, MD;  Location: [facility] ORS;  Service: Ophthalmology;  Laterality: Left;  US :01:24 AP:26.6 CDE:40.16 PACk [id number] H   CHOLECYSTECTOMY     MASTECTOMY Bilateral    Family History  Problem Relation Age of Onset   Parkinson's disease Mother    Hodgkin's lymphoma Father    Prostate cancer Neg Hx    Bladder Cancer Neg Hx    Kidney cancer Neg Hx    Social History   Tobacco Use   Smoking status: Never   Smokeless tobacco: Never  Substance Use Topics   Alcohol use: No   LABS:  Preadmission on 10/13/2023  Component Date Value Ref Range Status   MRSA, PCR 10/06/2023 NEGATIVE  NEGATIVE Final   Staphylococcus aureus 10/06/2023 NEGATIVE  NEGATIVE Final   Comment: (NOTE) The Xpert SA Assay (FDA approved for NASAL specimens in patients 47 years of age and older), is one component of a comprehensive surveillance program. It is not intended to diagnose infection nor to guide or monitor  treatment. Performed at [facility], [address], [place], [place] [address]   Hospital Outpatient Visit on 10/06/2023  Component Date Value Ref Range Status   CRP 10/06/2023 0.7  <1.0 mg/dL Final   Performed at [facility], [address], [place], [place] [address]   Sed Rate 10/06/2023 22  0 - 30 mm/hr Final   Performed at [facility], [address] [place], [place] [address]   IgE (Immunoglobulin E), Serum 10/06/2023 49  6 - 495 IU/mL Final   Comment: (NOTE) Performed At: [facility] [address] [place], [place] [id number] Pearlean Botts MD NG:[id number]    WBC 10/06/2023 6.8  4.0 - 10.5 K/uL Final   RBC 10/06/2023 3.60 (L)  3.87 - 5.11 MIL/uL Final   Hemoglobin 10/06/2023 11.1 (L)  12.0 - 15.0 g/dL Final   HCT 40/02/2724 34.5 (L)  36.0 - 46.0 % Final   MCV 10/06/2023 95.8  80.0 - 100.0 fL Final   MCH 10/06/2023 30.8  26.0 - 34.0 pg Final   MCHC 10/06/2023 32.2  30.0 - 36.0 g/dL Final   RDW 36/64/4034 14.2  11.5 - 15.5 % Final   Platelets 10/06/2023 171  150 - 400 K/uL Final   nRBC 10/06/2023 0.0  0.0 - 0.2 % Final   Performed at [facility], [address], [place], [place] [address]   Sodium  10/06/2023 134 (L)  135 - 145 mmol/L Final   Potassium 10/06/2023 4.3  3.5 - 5.1 mmol/L Final   Chloride 10/06/2023 102  98 - 111 mmol/L Final   CO2 10/06/2023 22  22 - 32 mmol/L Final   Glucose, Bld 10/06/2023 71  70 - 99 mg/dL Final   Glucose reference range applies only to samples taken after fasting for at least 8 hours.   BUN 10/06/2023 20  8 - 23 mg/dL Final   Creatinine, Ser 10/06/2023 1.10 (H)  0.44 - 1.00 mg/dL Final   Calcium  10/06/2023 9.1  8.9 - 10.3 mg/dL Final   Total Protein 16/02/9603 7.1  6.5 - 8.1 g/dL Final   Albumin 54/01/8118 3.6  3.5 - 5.0 g/dL Final   AST 14/78/2956 27  15 - 41 U/L Final   ALT 10/06/2023 18  0 - 44 U/L Final   Alkaline Phosphatase 10/06/2023 96  38 - 126 U/L Final   Total Bilirubin  10/06/2023 0.9  0.0 - 1.2 mg/dL Final   GFR, Estimated 10/06/2023 50 (L)  >60 mL/min Final   Comment: (NOTE) Calculated using the CKD-EPI Creatinine Equation (2021)    Anion gap 10/06/2023 10  5 - 15 Final   Performed at [facility], [address] [place], [place] [address]   Color, Urine 10/06/2023 YELLOW (A)  YELLOW Final   APPearance 10/06/2023 HAZY (A)  CLEAR Final   Specific Gravity, Urine 10/06/2023 1.018  1.005 - 1.030 Final   pH 10/06/2023 5.0  5.0 - 8.0 Final   Glucose, UA 10/06/2023 NEGATIVE  NEGATIVE mg/dL Final   Hgb urine dipstick 10/06/2023 NEGATIVE  NEGATIVE Final   Bilirubin Urine 10/06/2023 NEGATIVE  NEGATIVE Final   Ketones, ur 10/06/2023 NEGATIVE  NEGATIVE mg/dL Final   Protein, ur 65/78/4696 NEGATIVE  NEGATIVE mg/dL Final   Nitrite 29/52/8413 NEGATIVE  NEGATIVE Final   Leukocytes,Ua 10/06/2023 MODERATE (A)  NEGATIVE Final   RBC / HPF 10/06/2023 0-5  0 - 5 RBC/hpf Final   WBC, UA 10/06/2023 >50  0 - 5 WBC/hpf Final   Bacteria, UA 10/06/2023 MANY (A)  NONE SEEN Final   Squamous Epithelial / HPF 10/06/2023 6-10  0 - 5 /HPF Final   Mucus 10/06/2023 PRESENT   Final   Hyaline Casts, UA 10/06/2023 PRESENT   Final   Performed at [facility], [address] [place], [place] [address]    ECG: Date: 10/06/2023  Time ECG obtained: 1257 PM Rate: 56 bpm Rhythm: Sinus bradycardia with first-degree AV block; nonspecific IVCD Axis (leads I and aVF): right Intervals: PR 248 ms. QRS 172 ms. QTc 492 ms. ST segment and T wave changes: No evidence of acute T wave abnormalities or significant ST segment elevation or depression.  Evidence of a possible, age undetermined, prior infarct:  No Comparison: Previous tracing obtained on 10/22/2022 showed atrial fibrillation with PVCs at a rate of 87 bpm .  IMAGING / PROCEDURES: DIAGNOSTIC RADIOGRAPHS OF LEFT KNEE 3 VIEWS performed on 10/05/2023 Significant and severe osteoarthritis with medial compartment  collapse with bone-on-bone pathology involving the medial compartment.   There is osteopenia on x-ray.   There is sclerotic changes throughout with osteophyte formation involving the patellofemoral joint and the medial compartment.   There is slight erosion of the medial tibial plateau.   TRANSTHORACIC ECHOCARDIOGRAM performed on 07/27/2023 Mildly reduced left ventricular systolic function with an EF of 45% Global hypokinesis Mild LVH Mild left atrial enlargement Left ventricular diastolic Doppler parameters consistent  with abnormal relaxation (G1DD). Normal right ventricular size and function Significant mitral annular calcification Mild AR Moderate MR, TR, PR RVSP = 37 mmHg Normal gradients; no valvular stenosis  MR ABDOMEN W WO CONTRAST performed on 07/03/2022 Interval decrease in size of an exophytic lesion arising from the peripheral superior pole of the right kidney, measuring 3.0 x 2.4cm, previously 3.9 x 3.2 cm, signal characteristics and behavior consistent with a resolving hemorrhagic or proteinaceous cyst. No further follow-up or characterization is required. Aortic Atherosclerosis  MR BRAIN WO CONTRAST performed on 03/28/2022 No acute intracranial abnormality. Previously reported area of asymmetric hypoattenuation in the right external capsule correlates with an area chronic infarct.  CT HEAD AND CERVICAL SPINE WO CONTRAST performed on 03/28/2022 No CT evidence of intracranial injury. Nonspecific linear region of hypoattenuation along the right internal capsule. Consider further evaluation with a brain MRI, if clinically indicated. No acute fracture or traumatic subluxation. Grade 1 anterolisthesis of C6 on C7, C7 on T1. No evidence of high-grade spinal canal stenosis.   IMPRESSION AND PLAN: Lener Ventresca has been referred for pre-anesthesia review and clearance prior to her undergoing the planned anesthetic and procedural courses. Available labs, pertinent  testing, and imaging results were personally reviewed by me in preparation for upcoming operative/procedural course. [facility] medical record has been updated following extensive record review and patient interview with PAT staff.   Provided preoperative urine sample with findings concerning for possible infection.  With that said, sample with a good number of squamous epithelial cells present.  Patient was contacted by PAT staff to discuss.  She indicated that she was experiencing any significant symptoms that would consistent with urinary tract infection.  Patient did not wish to return to repeat sample prior to surgery. UA findings likely associated with ASB/colonization rather than true infection. She was advised to return call to PAT and/or surgeon should any symptoms develop.   This patient has been appropriately cleared by cardiology with an overall MODERATE risk of patient experiencing significant perioperative cardiovascular complications. Based on clinical review performed today (10/12/23), barring any significant acute changes in the patient's overall condition, it is anticipated that she will be able to proceed with the planned surgical intervention. Any acute changes in clinical condition may necessitate her procedure being postponed and/or cancelled. Patient will meet with anesthesia team (MD and/or CRNA) on the day of her procedure for preoperative evaluation/assessment. Questions regarding anesthetic course will be fielded at that time.   Pre-surgical instructions were reviewed with the patient during his PAT appointment, and questions were fielded to satisfaction by PAT clinical staff. She has been instructed on which medications that she will need to hold prior to surgery, as well as the ones that have been deemed safe/appropriate to take on the day of her procedure. As part of the general education provided by PAT, patient made aware both verbally and in writing, that she would need to  abstain from the use of any illegal substances during her perioperative course. She was advised that failure to follow the provided instructions could necessitate case cancellation or result in serious perioperative complications up to and including death. Patient encouraged to contact PAT and/or her surgeon's office to discuss any questions or concerns that may arise prior to surgery; verbalized understanding.   Renate Caroline, MSN, APRN, FNP-C, CEN [facility]  Perioperative Services Nurse Practitioner Phone: [phone] Fax: [phone] 10/12/23 11:40 AM  NOTE: This note has been prepared using Dragon dictation software. Despite  my best ability to proofread, there is always the potential that unintentional transcriptional errors may still occur from this process.

## 2023-10-13 ENCOUNTER — Encounter
Admission: RE | Disposition: A | Discharge status: Skilled Nursing Facility | Payer: Self-pay | Source: Ambulatory Visit | Attending: Orthopedic Surgery

## 2023-10-13 ENCOUNTER — Encounter: Payer: Self-pay | Admitting: Orthopedic Surgery

## 2023-10-13 ENCOUNTER — Other Ambulatory Visit: Payer: Self-pay

## 2023-10-13 ENCOUNTER — Inpatient Hospital Stay
Admission: RE | Admit: 2023-10-13 | Discharge: 2023-10-18 | DRG: 470 | Disposition: A | Discharge status: Skilled Nursing Facility | Source: Ambulatory Visit | Attending: Orthopedic Surgery | Admitting: Orthopedic Surgery

## 2023-10-13 ENCOUNTER — Ambulatory Visit: Payer: Self-pay | Admitting: Urgent Care

## 2023-10-13 ENCOUNTER — Observation Stay

## 2023-10-13 DIAGNOSIS — Z807 Family history of other malignant neoplasms of lymphoid, hematopoietic and related tissues: Secondary | ICD-10-CM

## 2023-10-13 DIAGNOSIS — Z7983 Long term (current) use of bisphosphonates: Secondary | ICD-10-CM

## 2023-10-13 DIAGNOSIS — D649 Anemia, unspecified: Secondary | ICD-10-CM

## 2023-10-13 DIAGNOSIS — I130 Hypertensive heart and chronic kidney disease with heart failure and stage 1 through stage 4 chronic kidney disease, or unspecified chronic kidney disease: Secondary | ICD-10-CM | POA: Diagnosis present

## 2023-10-13 DIAGNOSIS — N1831 Chronic kidney disease, stage 3a: Secondary | ICD-10-CM

## 2023-10-13 DIAGNOSIS — Z7901 Long term (current) use of anticoagulants: Secondary | ICD-10-CM

## 2023-10-13 DIAGNOSIS — Z8673 Personal history of transient ischemic attack (TIA), and cerebral infarction without residual deficits: Secondary | ICD-10-CM

## 2023-10-13 DIAGNOSIS — I4891 Unspecified atrial fibrillation: Secondary | ICD-10-CM

## 2023-10-13 DIAGNOSIS — M1712 Unilateral primary osteoarthritis, left knee: Principal | ICD-10-CM

## 2023-10-13 DIAGNOSIS — Z9049 Acquired absence of other specified parts of digestive tract: Secondary | ICD-10-CM

## 2023-10-13 DIAGNOSIS — E876 Hypokalemia: Secondary | ICD-10-CM

## 2023-10-13 DIAGNOSIS — N3281 Overactive bladder: Secondary | ICD-10-CM | POA: Diagnosis present

## 2023-10-13 DIAGNOSIS — I700 Atherosclerosis of aorta: Secondary | ICD-10-CM | POA: Diagnosis present

## 2023-10-13 DIAGNOSIS — G459 Transient cerebral ischemic attack, unspecified: Secondary | ICD-10-CM

## 2023-10-13 DIAGNOSIS — I5042 Chronic combined systolic (congestive) and diastolic (congestive) heart failure: Secondary | ICD-10-CM | POA: Diagnosis present

## 2023-10-13 DIAGNOSIS — Z9013 Acquired absence of bilateral breasts and nipples: Secondary | ICD-10-CM

## 2023-10-13 DIAGNOSIS — Z01812 Encounter for preprocedural laboratory examination: Secondary | ICD-10-CM

## 2023-10-13 DIAGNOSIS — Z96652 Presence of left artificial knee joint: Secondary | ICD-10-CM

## 2023-10-13 DIAGNOSIS — M5030 Other cervical disc degeneration, unspecified cervical region: Secondary | ICD-10-CM | POA: Diagnosis present

## 2023-10-13 DIAGNOSIS — E785 Hyperlipidemia, unspecified: Secondary | ICD-10-CM | POA: Diagnosis present

## 2023-10-13 DIAGNOSIS — I480 Paroxysmal atrial fibrillation: Secondary | ICD-10-CM

## 2023-10-13 DIAGNOSIS — I4819 Other persistent atrial fibrillation: Secondary | ICD-10-CM | POA: Diagnosis present

## 2023-10-13 DIAGNOSIS — Z79899 Other long term (current) drug therapy: Secondary | ICD-10-CM

## 2023-10-13 DIAGNOSIS — Z820 Family history of epilepsy and other diseases of the nervous system: Secondary | ICD-10-CM

## 2023-10-13 DIAGNOSIS — I739 Peripheral vascular disease, unspecified: Secondary | ICD-10-CM

## 2023-10-13 DIAGNOSIS — I341 Nonrheumatic mitral (valve) prolapse: Secondary | ICD-10-CM | POA: Diagnosis present

## 2023-10-13 DIAGNOSIS — I482 Chronic atrial fibrillation, unspecified: Secondary | ICD-10-CM

## 2023-10-13 DIAGNOSIS — Z8042 Family history of malignant neoplasm of prostate: Secondary | ICD-10-CM

## 2023-10-13 DIAGNOSIS — I5022 Chronic systolic (congestive) heart failure: Secondary | ICD-10-CM | POA: Diagnosis present

## 2023-10-13 DIAGNOSIS — M810 Age-related osteoporosis without current pathological fracture: Secondary | ICD-10-CM | POA: Diagnosis present

## 2023-10-13 DIAGNOSIS — Z90710 Acquired absence of both cervix and uterus: Secondary | ICD-10-CM

## 2023-10-13 DIAGNOSIS — Z880 Allergy status to penicillin: Secondary | ICD-10-CM

## 2023-10-13 DIAGNOSIS — M170 Bilateral primary osteoarthritis of knee: Principal | ICD-10-CM | POA: Diagnosis present

## 2023-10-13 DIAGNOSIS — E871 Hypo-osmolality and hyponatremia: Secondary | ICD-10-CM

## 2023-10-13 DIAGNOSIS — M48062 Spinal stenosis, lumbar region with neurogenic claudication: Secondary | ICD-10-CM | POA: Diagnosis present

## 2023-10-13 DIAGNOSIS — I447 Left bundle-branch block, unspecified: Secondary | ICD-10-CM | POA: Diagnosis present

## 2023-10-13 DIAGNOSIS — Z853 Personal history of malignant neoplasm of breast: Secondary | ICD-10-CM

## 2023-10-13 HISTORY — DX: Unspecified atrial fibrillation: I48.91

## 2023-10-13 HISTORY — DX: Spinal stenosis, lumbar region with neurogenic claudication: M48.062

## 2023-10-13 HISTORY — DX: Nonrheumatic mitral (valve) prolapse: I34.1

## 2023-10-13 HISTORY — DX: Essential (primary) hypertension: I10

## 2023-10-13 HISTORY — DX: Atherosclerosis of aorta: I70.0

## 2023-10-13 HISTORY — DX: Chronic kidney disease, stage 3 unspecified: N18.30

## 2023-10-13 HISTORY — PX: KNEE ARTHROPLASTY: SHX992

## 2023-10-13 HISTORY — DX: Unspecified age-related cataract: H25.9

## 2023-10-13 HISTORY — DX: Other cerebrovascular disease: I67.89

## 2023-10-13 HISTORY — DX: Unspecified atrial fibrillation: Z79.01

## 2023-10-13 HISTORY — DX: Left bundle-branch block, unspecified: I44.7

## 2023-10-13 HISTORY — DX: Endocarditis, valve unspecified: I38

## 2023-10-13 HISTORY — DX: Hyperlipidemia, unspecified: E78.5

## 2023-10-13 HISTORY — DX: Overactive bladder: N32.81

## 2023-10-13 HISTORY — DX: Other persistent atrial fibrillation: I48.19

## 2023-10-13 HISTORY — DX: Other cervical disc degeneration, unspecified cervical region: M50.30

## 2023-10-13 HISTORY — DX: Other long term (current) drug therapy: Z79.899

## 2023-10-13 HISTORY — DX: Other seasonal allergic rhinitis: J30.2

## 2023-10-13 HISTORY — DX: Unspecified systolic (congestive) heart failure: I50.20

## 2023-10-13 HISTORY — DX: Age-related osteoporosis without current pathological fracture: M81.0

## 2023-10-13 HISTORY — DX: Anemia, unspecified: D64.9

## 2023-10-13 LAB — PROTIME-INR
INR: 1.8 — ABNORMAL HIGH (ref 0.8–1.2)
Prothrombin Time: 21.3 s — ABNORMAL HIGH (ref 11.4–15.2)

## 2023-10-13 SURGERY — ARTHROPLASTY, KNEE, TOTAL, USING IMAGELESS COMPUTER-ASSISTED NAVIGATION
Anesthesia: General | Site: Knee | Laterality: Left

## 2023-10-13 MED ORDER — ACETAMINOPHEN 10 MG/ML IV SOLN
1000.0000 mg | Freq: Four times a day (QID) | INTRAVENOUS | Status: AC
  Administered 2023-10-13 – 2023-10-14 (×4): 1000 mg via INTRAVENOUS
  Filled 2023-10-13 (×3): qty 100

## 2023-10-13 MED ORDER — PROPOFOL 10 MG/ML IV BOLUS
INTRAVENOUS | Status: AC
  Filled 2023-10-13: qty 20

## 2023-10-13 MED ORDER — TRAMADOL HCL 50 MG PO TABS: 50.0000 mg | ORAL_TABLET | ORAL | Status: DC | PRN

## 2023-10-13 MED ORDER — SODIUM CHLORIDE (PF) 0.9 % IJ SOLN
INTRAMUSCULAR | Status: AC
  Filled 2023-10-13: qty 80

## 2023-10-13 MED ORDER — METOCLOPRAMIDE HCL 5 MG PO TABS
10.0000 mg | ORAL_TABLET | Freq: Three times a day (TID) | ORAL | Status: AC
  Administered 2023-10-13 – 2023-10-15 (×8): 10 mg via ORAL
  Filled 2023-10-13 (×6): qty 1
  Filled 2023-10-13: qty 2
  Filled 2023-10-13: qty 1

## 2023-10-13 MED ORDER — MIDAZOLAM HCL 5 MG/5ML IJ SOLN
INTRAMUSCULAR | Status: DC | PRN
  Administered 2023-10-13: 1 mg via INTRAVENOUS

## 2023-10-13 MED ORDER — PHENYLEPHRINE HCL-NACL 20-0.9 MG/250ML-% IV SOLN
INTRAVENOUS | Status: DC | PRN
  Administered 2023-10-13: 32 ug/min via INTRAVENOUS

## 2023-10-13 MED ORDER — PROPOFOL 1000 MG/100ML IV EMUL
INTRAVENOUS | Status: AC
  Filled 2023-10-13: qty 100

## 2023-10-13 MED ORDER — DEXMEDETOMIDINE HCL IN NACL 80 MCG/20ML IV SOLN
INTRAVENOUS | Status: AC
Start: 2023-10-13 — End: ?
  Filled 2023-10-13: qty 20

## 2023-10-13 MED ORDER — ACETAMINOPHEN 325 MG PO TABS
325.0000 mg | ORAL_TABLET | Freq: Four times a day (QID) | ORAL | Status: DC | PRN
  Administered 2023-10-16 – 2023-10-17 (×3): 650 mg via ORAL
  Filled 2023-10-13 (×3): qty 2

## 2023-10-13 MED ORDER — TRANEXAMIC ACID-NACL 1000-0.7 MG/100ML-% IV SOLN
INTRAVENOUS | Status: AC
  Filled 2023-10-13: qty 100

## 2023-10-13 MED ORDER — OXYCODONE HCL 5 MG/5ML PO SOLN: 5.0000 mg | Freq: Once | ORAL | Status: DC | PRN

## 2023-10-13 MED ORDER — FENTANYL CITRATE (PF) 100 MCG/2ML IJ SOLN
INTRAMUSCULAR | Status: AC
  Filled 2023-10-13: qty 2

## 2023-10-13 MED ORDER — BISACODYL 10 MG RE SUPP: 10.0000 mg | Freq: Every day | RECTAL | Status: DC | PRN

## 2023-10-13 MED ORDER — ALUM & MAG HYDROXIDE-SIMETH 200-200-20 MG/5ML PO SUSP
30.0000 mL | ORAL | Status: DC | PRN
  Filled 2023-10-13: qty 30

## 2023-10-13 MED ORDER — PHENOL 1.4 % MT LIQD: 1.0000 | OROMUCOSAL | Status: DC | PRN

## 2023-10-13 MED ORDER — OXYCODONE HCL 5 MG PO TABS: 5.0000 mg | ORAL_TABLET | ORAL | Status: DC | PRN

## 2023-10-13 MED ORDER — LABETALOL HCL 5 MG/ML IV SOLN
INTRAVENOUS | Status: DC | PRN
  Administered 2023-10-13 (×2): 5 mg via INTRAVENOUS

## 2023-10-13 MED ORDER — BUPIVACAINE HCL (PF) 0.5 % IJ SOLN
INTRAMUSCULAR | Status: AC
  Filled 2023-10-13: qty 10

## 2023-10-13 MED ORDER — BUPIVACAINE LIPOSOME 1.3 % IJ SUSP
INTRAMUSCULAR | Status: AC
  Filled 2023-10-13: qty 20

## 2023-10-13 MED ORDER — CEFAZOLIN SODIUM-DEXTROSE 2-4 GM/100ML-% IV SOLN
INTRAVENOUS | Status: AC
  Filled 2023-10-13: qty 100

## 2023-10-13 MED ORDER — ROCURONIUM BROMIDE 10 MG/ML (PF) SYRINGE
PREFILLED_SYRINGE | INTRAVENOUS | Status: DC | PRN
Start: 2023-10-13 — End: 2023-10-13
  Administered 2023-10-13: 40 mg via INTRAVENOUS
  Administered 2023-10-13 (×2): 10 mg via INTRAVENOUS

## 2023-10-13 MED ORDER — ONDANSETRON HCL 4 MG/2ML IJ SOLN
INTRAMUSCULAR | Status: AC
  Filled 2023-10-13: qty 2

## 2023-10-13 MED ORDER — MENTHOL 3 MG MT LOZG: 1.0000 | LOZENGE | OROMUCOSAL | Status: DC | PRN

## 2023-10-13 MED ORDER — ONDANSETRON HCL 4 MG PO TABS: 4.0000 mg | ORAL_TABLET | Freq: Four times a day (QID) | ORAL | Status: DC | PRN

## 2023-10-13 MED ORDER — SURGIPHOR WOUND IRRIGATION SYSTEM - OPTIME: TOPICAL | Status: DC | PRN

## 2023-10-13 MED ORDER — EPHEDRINE SULFATE-NACL 50-0.9 MG/10ML-% IV SOSY
PREFILLED_SYRINGE | INTRAVENOUS | Status: DC | PRN
Start: 2023-10-13 — End: 2023-10-13
  Administered 2023-10-13: 5 mg via INTRAVENOUS

## 2023-10-13 MED ORDER — MIDAZOLAM HCL 2 MG/2ML IJ SOLN
INTRAMUSCULAR | Status: AC
  Filled 2023-10-13: qty 2

## 2023-10-13 MED ORDER — PANTOPRAZOLE SODIUM 40 MG PO TBEC
40.0000 mg | DELAYED_RELEASE_TABLET | Freq: Two times a day (BID) | ORAL | Status: DC
  Administered 2023-10-13 – 2023-10-18 (×11): 40 mg via ORAL
  Filled 2023-10-13 (×11): qty 1

## 2023-10-13 MED ORDER — FERROUS SULFATE 325 (65 FE) MG PO TABS
325.0000 mg | ORAL_TABLET | Freq: Two times a day (BID) | ORAL | Status: DC
  Administered 2023-10-13 – 2023-10-18 (×10): 325 mg via ORAL
  Filled 2023-10-13 (×10): qty 1

## 2023-10-13 MED ORDER — SODIUM CHLORIDE 0.9 % IV SOLN: INTRAVENOUS | Status: DC

## 2023-10-13 MED ORDER — TRANEXAMIC ACID-NACL 1000-0.7 MG/100ML-% IV SOLN
1000.0000 mg | Freq: Once | INTRAVENOUS | Status: AC
  Administered 2023-10-13: 1000 mg via INTRAVENOUS

## 2023-10-13 MED ORDER — ACETAMINOPHEN 10 MG/ML IV SOLN
INTRAVENOUS | Status: DC | PRN
  Administered 2023-10-13: 1000 mg via INTRAVENOUS

## 2023-10-13 MED ORDER — ACETAMINOPHEN 10 MG/ML IV SOLN
INTRAVENOUS | Status: AC
  Filled 2023-10-13: qty 100

## 2023-10-13 MED ORDER — FLEET ENEMA RE ENEM
1.0000 | ENEMA | Freq: Once | RECTAL | Status: DC | PRN
Start: 2023-10-13 — End: 2023-10-18

## 2023-10-13 MED ORDER — HYDROMORPHONE HCL 1 MG/ML IJ SOLN: 0.5000 mg | INTRAMUSCULAR | Status: DC | PRN

## 2023-10-13 MED ORDER — DEXMEDETOMIDINE HCL IN NACL 80 MCG/20ML IV SOLN
INTRAVENOUS | Status: DC | PRN
Start: 2023-10-13 — End: 2023-10-13
  Administered 2023-10-13: 4 ug via INTRAVENOUS

## 2023-10-13 MED ORDER — ACETAMINOPHEN 10 MG/ML IV SOLN
INTRAVENOUS | Status: AC
Start: 2023-10-13 — End: ?
  Filled 2023-10-13: qty 100

## 2023-10-13 MED ORDER — GLYCOPYRROLATE 0.2 MG/ML IJ SOLN
INTRAMUSCULAR | Status: AC
  Filled 2023-10-13: qty 1

## 2023-10-13 MED ORDER — PHENYLEPHRINE HCL-NACL 20-0.9 MG/250ML-% IV SOLN
INTRAVENOUS | Status: AC
  Filled 2023-10-13: qty 250

## 2023-10-13 MED ORDER — ONDANSETRON HCL 4 MG/2ML IJ SOLN
INTRAMUSCULAR | Status: DC | PRN
Start: 2023-10-13 — End: 2023-10-13
  Administered 2023-10-13: 4 mg via INTRAVENOUS

## 2023-10-13 MED ORDER — ENSURE PRE-SURGERY PO LIQD
296.0000 mL | Freq: Once | ORAL | Status: AC
  Administered 2023-10-13: 296 mL via ORAL
  Filled 2023-10-13: qty 296

## 2023-10-13 MED ORDER — SENNOSIDES-DOCUSATE SODIUM 8.6-50 MG PO TABS
1.0000 | ORAL_TABLET | Freq: Two times a day (BID) | ORAL | Status: DC
  Administered 2023-10-13 – 2023-10-18 (×10): 1 via ORAL
  Filled 2023-10-13 (×11): qty 1

## 2023-10-13 MED ORDER — FENTANYL CITRATE (PF) 100 MCG/2ML IJ SOLN
25.0000 ug | INTRAMUSCULAR | Status: DC | PRN
  Administered 2023-10-13 (×4): 25 ug via INTRAVENOUS

## 2023-10-13 MED ORDER — OXYCODONE HCL 5 MG PO TABS: 5.0000 mg | ORAL_TABLET | Freq: Once | ORAL | Status: DC | PRN

## 2023-10-13 MED ORDER — DEXAMETHASONE SODIUM PHOSPHATE 10 MG/ML IJ SOLN
INTRAMUSCULAR | Status: AC
Start: 2023-10-13 — End: ?
  Filled 2023-10-13: qty 1

## 2023-10-13 MED ORDER — CEFAZOLIN SODIUM-DEXTROSE 2-4 GM/100ML-% IV SOLN
2.0000 g | Freq: Four times a day (QID) | INTRAVENOUS | Status: AC
  Administered 2023-10-13 (×2): 2 g via INTRAVENOUS
  Filled 2023-10-13: qty 100

## 2023-10-13 MED ORDER — ONDANSETRON HCL 4 MG/2ML IJ SOLN
4.0000 mg | Freq: Four times a day (QID) | INTRAMUSCULAR | Status: DC | PRN
Start: 2023-10-13 — End: 2023-10-18

## 2023-10-13 MED ORDER — PHENYLEPHRINE 80 MCG/ML (10ML) SYRINGE FOR IV PUSH (FOR BLOOD PRESSURE SUPPORT)
PREFILLED_SYRINGE | INTRAVENOUS | Status: DC | PRN
Start: 2023-10-13 — End: 2023-10-13
  Administered 2023-10-13: 80 ug via INTRAVENOUS

## 2023-10-13 MED ORDER — ORAL CARE MOUTH RINSE: 15.0000 mL | OROMUCOSAL | Status: DC | PRN

## 2023-10-13 MED ORDER — GABAPENTIN 300 MG PO CAPS
ORAL_CAPSULE | ORAL | Status: AC
  Filled 2023-10-13: qty 1

## 2023-10-13 MED ORDER — LIDOCAINE HCL (PF) 2 % IJ SOLN
INTRAMUSCULAR | Status: AC
  Filled 2023-10-13: qty 5

## 2023-10-13 MED ORDER — CHLORHEXIDINE GLUCONATE 0.12 % MT SOLN
OROMUCOSAL | Status: AC
  Filled 2023-10-13: qty 15

## 2023-10-13 MED ORDER — LIDOCAINE HCL (CARDIAC) PF 100 MG/5ML IV SOSY
PREFILLED_SYRINGE | INTRAVENOUS | Status: DC | PRN
  Administered 2023-10-13: 60 mg via INTRATRACHEAL

## 2023-10-13 MED ORDER — WARFARIN SODIUM 3 MG PO TABS
3.0000 mg | ORAL_TABLET | Freq: Once | ORAL | Status: AC
  Administered 2023-10-13: 3 mg via ORAL
  Filled 2023-10-13: qty 1

## 2023-10-13 MED ORDER — BUPIVACAINE HCL (PF) 0.25 % IJ SOLN
INTRAMUSCULAR | Status: AC
  Filled 2023-10-13: qty 120

## 2023-10-13 MED ORDER — MAGNESIUM HYDROXIDE 400 MG/5ML PO SUSP
30.0000 mL | Freq: Every day | ORAL | Status: DC
  Administered 2023-10-14 – 2023-10-18 (×4): 30 mL via ORAL
  Filled 2023-10-13 (×5): qty 30

## 2023-10-13 MED ORDER — LABETALOL HCL 5 MG/ML IV SOLN
INTRAVENOUS | Status: AC
  Filled 2023-10-13: qty 4

## 2023-10-13 MED ORDER — SODIUM CHLORIDE (PF) 0.9 % IJ SOLN
INTRAMUSCULAR | Status: DC | PRN
  Administered 2023-10-13: 120 mL via INTRAMUSCULAR

## 2023-10-13 MED ORDER — FENTANYL CITRATE (PF) 100 MCG/2ML IJ SOLN
INTRAMUSCULAR | Status: DC | PRN
  Administered 2023-10-13 (×2): 50 ug via INTRAVENOUS

## 2023-10-13 MED ORDER — OXYCODONE HCL 5 MG PO TABS: 10.0000 mg | ORAL_TABLET | ORAL | Status: DC | PRN

## 2023-10-13 MED ORDER — DIPHENHYDRAMINE HCL 12.5 MG/5ML PO ELIX: 12.5000 mg | ORAL_SOLUTION | ORAL | Status: DC | PRN

## 2023-10-13 MED ORDER — SODIUM CHLORIDE 0.9 % IR SOLN
Status: DC | PRN
  Administered 2023-10-13: 3000 mL

## 2023-10-13 MED ORDER — GLYCOPYRROLATE 0.2 MG/ML IJ SOLN
INTRAMUSCULAR | Status: DC | PRN
Start: 2023-10-13 — End: 2023-10-13
  Administered 2023-10-13: .2 mg via INTRAVENOUS

## 2023-10-13 MED ORDER — CELECOXIB 200 MG PO CAPS
ORAL_CAPSULE | ORAL | Status: AC
Start: 2023-10-13 — End: ?
  Filled 2023-10-13: qty 2

## 2023-10-13 MED ORDER — SUGAMMADEX SODIUM 200 MG/2ML IV SOLN
INTRAVENOUS | Status: DC | PRN
Start: 2023-10-13 — End: 2023-10-13
  Administered 2023-10-13: 130 mg via INTRAVENOUS

## 2023-10-13 MED ORDER — PROPOFOL 10 MG/ML IV BOLUS
INTRAVENOUS | Status: DC | PRN
Start: 2023-10-13 — End: 2023-10-13
  Administered 2023-10-13: 50 mg via INTRAVENOUS

## 2023-10-13 MED ORDER — SUCCINYLCHOLINE CHLORIDE 200 MG/10ML IV SOSY
PREFILLED_SYRINGE | INTRAVENOUS | Status: AC
Start: 2023-10-13 — End: ?
  Filled 2023-10-13: qty 10

## 2023-10-13 MED ORDER — WARFARIN - PHARMACIST DOSING INPATIENT: Freq: Every day | Status: DC

## 2023-10-13 MED ORDER — CELECOXIB 200 MG PO CAPS
200.0000 mg | ORAL_CAPSULE | Freq: Two times a day (BID) | ORAL | Status: DC
Start: 2023-10-13 — End: 2023-10-18
  Administered 2023-10-13 – 2023-10-18 (×11): 200 mg via ORAL
  Filled 2023-10-13 (×11): qty 1

## 2023-10-13 SURGICAL SUPPLY — 66 items
ATTUNE PS FEM LT SZ 6 CEM KNEE (Femur) IMPLANT
ATTUNE PSRP INSR SZ6 6 KNEE (Insert) IMPLANT
BASE TIBIAL ROT PLAT SZ 5 KNEE (Knees) IMPLANT
BATTERY INSTRU NAVIGATION (MISCELLANEOUS) ×4 IMPLANT
BIT DRILL QUICK REL 1/8 2PK SL (BIT) ×1 IMPLANT
BLADE CLIPPER SURG (BLADE) IMPLANT
BLADE SAW 70X12.5 (BLADE) ×1 IMPLANT
BLADE SAW 90X13X1.19 OSCILLAT (BLADE) ×1 IMPLANT
BLADE SAW 90X25X1.19 OSCILLAT (BLADE) ×1 IMPLANT
BRUSH SCRUB EZ PLAIN DRY (MISCELLANEOUS) ×1 IMPLANT
CATH COUDE FOLEY 5CC 14FR (CATHETERS) IMPLANT
CEMENT BONE GENTAMICIN 40 (Cement) IMPLANT
CEMENT HV SMART SET (Cement) IMPLANT
COOLER POLAR GLACIER W/PUMP (MISCELLANEOUS) ×1 IMPLANT
CUFF TRNQT CYL 24X4X16.5-23 (TOURNIQUET CUFF) IMPLANT
CUFF TRNQT CYL 30X4X21-28X (TOURNIQUET CUFF) IMPLANT
DRAPE INCISE IOBAN 66X45 STRL (DRAPES) IMPLANT
DRAPE SHEET LG 3/4 BI-LAMINATE (DRAPES) ×1 IMPLANT
DRSG AQUACEL AG ADV 3.5X14 (GAUZE/BANDAGES/DRESSINGS) ×1 IMPLANT
DRSG MEPILEX SACRM 8.7X9.8 (GAUZE/BANDAGES/DRESSINGS) ×1 IMPLANT
DRSG TEGADERM 4X4.75 (GAUZE/BANDAGES/DRESSINGS) ×1 IMPLANT
DURAPREP 26ML APPLICATOR (WOUND CARE) ×2 IMPLANT
ELECT CAUTERY BLADE 6.4 (BLADE) ×1 IMPLANT
ELECTRODE REM PT RTRN 9FT ADLT (ELECTROSURGICAL) ×1 IMPLANT
EVACUATOR 1/8 PVC DRAIN (DRAIN) ×1 IMPLANT
EX-PIN ORTHOLOCK NAV 4X150 (PIN) ×2 IMPLANT
GAUZE XEROFORM 1X8 LF (GAUZE/BANDAGES/DRESSINGS) ×1 IMPLANT
GLOVE BIOGEL M STRL SZ7.5 (GLOVE) ×6 IMPLANT
GLOVE BIOGEL PI IND STRL 8 (GLOVE) ×1 IMPLANT
GLOVE SRG 8 PF TXTR STRL LF DI (GLOVE) ×1 IMPLANT
GOWN STRL REUS W/ TWL LRG LVL3 (GOWN DISPOSABLE) ×1 IMPLANT
GOWN STRL REUS W/ TWL XL LVL3 (GOWN DISPOSABLE) ×1 IMPLANT
GOWN TOGA ZIPPER T7+ PEEL AWAY (MISCELLANEOUS) ×1 IMPLANT
HOLDER FOLEY CATH W/STRAP (MISCELLANEOUS) ×1 IMPLANT
HOOD PEEL AWAY T7 (MISCELLANEOUS) ×1 IMPLANT
KIT TURNOVER KIT A (KITS) ×1 IMPLANT
KNIFE SCULPS 14X20 (INSTRUMENTS) ×1 IMPLANT
MANIFOLD NEPTUNE II (INSTRUMENTS) ×2 IMPLANT
NDL SPNL 20GX3.5 QUINCKE YW (NEEDLE) ×2 IMPLANT
NEEDLE SPNL 20GX3.5 QUINCKE YW (NEEDLE) ×2 IMPLANT
PACK TOTAL KNEE (MISCELLANEOUS) ×1 IMPLANT
PAD ABD DERMACEA PRESS 5X9 (GAUZE/BANDAGES/DRESSINGS) ×2 IMPLANT
PAD ARMBOARD POSITIONER FOAM (MISCELLANEOUS) ×3 IMPLANT
PAD WRAPON POLAR KNEE (MISCELLANEOUS) ×1 IMPLANT
PATELLA MEDIAL ATTUN 35MM KNEE (Knees) IMPLANT
PENCIL SMOKE EVACUATOR COATED (MISCELLANEOUS) ×1 IMPLANT
PIN DRILL FIX HALF THREAD (BIT) ×2 IMPLANT
PIN FIXATION 1/8DIA X 3INL (PIN) ×1 IMPLANT
SOL .9 NS 3000ML IRR UROMATIC (IV SOLUTION) ×1 IMPLANT
SOLUTION IRRIG SURGIPHOR (IV SOLUTION) ×1 IMPLANT
SPONGE DRAIN TRACH 4X4 STRL 2S (GAUZE/BANDAGES/DRESSINGS) ×1 IMPLANT
STAPLER SKIN PROX 35W (STAPLE) ×1 IMPLANT
STOCKINETTE IMPERV 14X48 (MISCELLANEOUS) ×1 IMPLANT
STOCKINETTE STRL BIAS CUT 8X4 (MISCELLANEOUS) ×1 IMPLANT
STRAP TIBIA SHORT (MISCELLANEOUS) ×1 IMPLANT
SUCTION TUBE FRAZIER 10FR DISP (SUCTIONS) ×1 IMPLANT
SUT VIC AB 0 CT1 36 (SUTURE) ×1 IMPLANT
SUT VIC AB 1 CT1 36 (SUTURE) ×2 IMPLANT
SUT VIC AB 2-0 CT2 27 (SUTURE) ×1 IMPLANT
SYR 30ML LL (SYRINGE) ×2 IMPLANT
TIP FAN IRRIG PULSAVAC PLUS (DISPOSABLE) ×1 IMPLANT
TOWEL OR 17X26 4PK STRL BLUE (TOWEL DISPOSABLE) ×1 IMPLANT
TOWER CARTRIDGE SMART MIX (DISPOSABLE) ×1 IMPLANT
TRAP FLUID SMOKE EVACUATOR (MISCELLANEOUS) ×1 IMPLANT
TRAY FOLEY MTR SLVR 16FR STAT (SET/KITS/TRAYS/PACK) ×1 IMPLANT
WATER STERILE IRR 1000ML POUR (IV SOLUTION) ×1 IMPLANT

## 2023-10-13 NOTE — H&P (Signed)
 ORTHOPAEDIC HISTORY & PHYSICAL Buckner Carder, [place] - 10/05/2023 3:00 PM EDT Formatting of this note is different from the original. Images from the original note were not included. Chief Complaint: Chief Complaint Patient presents with Left Knee - Pain, Pre-op Exam  Reason for Visit: The patient is a 83 y.o. female who presents today with a family member for history and physical for a left total knee arthroplasty to be done by Dr. Aubry Blase on October 13, 2023. The patient saw Dr. Andrina Locken for evaluation in April of both knees. She has a long history of bilateral knee pain with the left knee more symptomatic. She localizes most of the pain along the medial aspect of the knees. She reports some swelling, no locking, and significant giving way of the knees. The pain is aggravated by any weight bearing. The knee pain limits the patient's ability to ambulate long distances and she has become increasingly sedentary. The patient has not appreciated any significant improvement despite Tylenol , activity modification, intraarticular corticosteroid injections, and ambulatory aids. She is unable to tolerate NSAIDs due to anticoagulation with warfarin. She is using a walker for ambulation but presents today in a wheelchair. The patient states that the knee pain has progressed to the point that it is significantly interfering with her activities of daily living.  Medications: Current Outpatient Medications Medication Sig Dispense Refill acetaminophen  (TYLENOL ) 650 MG ER tablet Take 1 tablet (650 mg total) by mouth 2 (two) times daily as needed alendronate  (FOSAMAX ) 70 MG tablet TAKE 1 TABLET BY MOUTH ONE TIME PER WEEK 12 tablet 1 AMIOdarone (PACERONE) 200 MG tablet TAKE 1 TABLET BY MOUTH EVERY DAY 90 tablet 1 calcium  carbonate-vitamin D3 (OS-CAL 500+D) 500 mg(1,250mg ) -200 unit tablet Take 1 tablet by mouth once daily cholecalciferol  (VITAMIN D3) 2,000 unit capsule Take 2,000 Units by mouth once  daily cyanocobalamin  (VITAMIN B12) 1000 MCG tablet Take 1,000 mcg by mouth once daily dapagliflozin propanediol (FARXIGA) 10 mg tablet Take 1 tablet (10 mg total) by mouth once daily 30 tablet 11 magnesium  chloride (SLOW-MAG) 71.5 mg DR tablet Take 2 tablets by mouth 2 (two) times daily 360 tablet 11 metoprolol  succinate (TOPROL -XL) 50 MG XL tablet TAKE 1 TABLET BY MOUTH TWICE A DAY 180 tablet 3 potassium chloride  (KLOR-CON ) 10 MEQ ER tablet TAKE 1 TABLET BY MOUTH ONCE DAILY 90 tablet 3 sodium chloride  1,000 mg soluble tablet Take 1 tablet (1 g total) by mouth 2 (two) times daily 180 tablet 1 warfarin (COUMADIN ) 2 MG tablet TAKE 1 TABLET BY MOUTH ONCE DAILY. 90 tablet 1 warfarin (COUMADIN ) 5 MG tablet Take 5 mg by mouth once daily warfarin (COUMADIN ) 1 MG tablet Take 1 tablet (1 mg total) by mouth as directed 90 tablet 3 warfarin (COUMADIN ) 2.5 MG tablet Take 2.5 mg by mouth once daily  No current facility-administered medications for this visit.  Allergies: Allergies Allergen Reactions Penicillins Rash  Past Medical History: Past Medical History: Diagnosis Date Allergic state seasonal Breast cancer (CMS/HHS-HCC) Breast cancer of upper-outer quadrant of right female breast (CMS/HHS-HCC) 11/26/2014 Last Assessment & Plan: Formatting of this note might be different from the original. History of breast cancer. Cataract cortical, senile Chronic diastolic CHF (congestive heart failure) (CMS/HHS-HCC) 01/15/2022 CKD (chronic kidney disease) stage 2, GFR 60-89 ml/min 12/30/2021 Closed dislocation of right shoulder 03/28/2022 Essential hypertension 05/10/2015 Fall at home, initial encounter 01/15/2022 Last Assessment & Plan: Formatting of this note might be different from the original. - Fall precaution -PT/OT H/O bilateral mastectomy 05/01/2014 History of cancer  of right breast 05/01/2014 Hyperlipidemia Hypertension Lumbar stenosis without neurogenic claudication 01/11/2020 Myocardial  injury 01/15/2022 Last Assessment & Plan: Formatting of this note might be different from the original. Myocardial injury due to A-fib with RVR and sepsis. Troponin level 24 --> 63 -Trend troponin -Check FLP -A1c was 5.2 on 12/28/2021, will not repeat today -Hold Lipitor  due to abnormal liver function - will not start ASA since pt is on coumadin . Normocytic anemia 12/07/2019 Osteoarthritis Osteoporosis 07/06/2017 Paroxysmal atrial fibrillation with RVR (CMS/HHS-HCC) 10/14/2021 Last Assessment & Plan: Formatting of this note might be different from the original. Toprol -XL for rate control and Coumadin  dosing. Last Assessment & Plan: Formatting of this note might be different from the original. HR is up to 135, improving, HR is 70s now on Cardizem  gtt. -will place in PCU for obs -cardizem  gtt -Continue metoprolol  -Continue Coumadin  per pharma Premature atrial contractions 01/06/2017 Premature ventricular contractions 01/06/2017 TIA (transient ischemic attack) 12/28/2021 Last Assessment & Plan: Formatting of this note might be different from the original. Patient had 2 episodes of slurred speech and facial droop. Symptoms resolved. Seen by neurology and he recommended Coumadin  alone without any antiplatelet agents or bridge while INR comes up. Patient had a subtherapeutic INR of 1.3 on presentation and 1.5 upon discharge. Echocardiogram normal. Carotid ultraso  Past Surgical History: Past Surgical History: Procedure Laterality Date FRACTURE SURGERY 07/04/2018 CLO TX mandible FX W/Interdental Fixa removal of implant 08/29/2018 Removal of implant ( superficial wire pin or rod ) CATARACT EXTRACTION Bilateral CHOLECYSTECTOMY CORRECTION HAMMER TOE HYSTERECTOMY total MASTECTOMY cancer on right but had both removed  Social History: Social History  Socioeconomic History Marital status: Married Spouse name: Galvin Jules Number of children: 0 Years of education: 12 Highest education level:  High school graduate Occupational History Occupation: RetiredMerchant navy officer Tobacco Use Smoking status: Never Smokeless tobacco: Never Vaping Use Vaping status: Never Used Substance and Sexual Activity Alcohol use: No Drug use: No Sexual activity: Defer Partners: Male  Social Drivers of Catering manager Strain: Low Risk (04/20/2023) Overall Financial Resource Strain (CARDIA) Difficulty of Paying Living Expenses: Not hard at all Food Insecurity: No Food Insecurity (04/20/2023) Hunger Vital Sign Worried About Running Out of Food in the Last Year: Never true Ran Out of Food in the Last Year: Never true Transportation Needs: No Transportation Needs (04/20/2023) PRAPARE - Contractor (Medical): No Lack of Transportation (Non-Medical): No Housing Stability: Low Risk (07/22/2023) Housing Stability Vital Sign Unable to Pay for Housing in the Last Year: No Number of Times Moved in the Last Year: 0 Homeless in the Last Year: No  Family History: Family History Problem Relation Name Age of Onset Parkinsonism Mother Hodgkin's lymphoma Father Prostate cancer Brother Colon cancer Neg Hx  Review of Systems: A comprehensive 14 point ROS was performed, reviewed, and the pertinent orthopaedic findings are documented in the HPI.  Exam BP (!) 162/80  Ht 157.5 cm (5\' 2" )  Wt 64.4 kg (142 lb)  LMP (LMP Unknown) Comment: Hysterectomy  BMI 25.97 kg/m  General: Well-developed, well-nourished female seen in no acute distress. Gait was not assessed since the patient presented in a wheelchair.  HEENT: Atraumatic, normocephalic. Pupils are equal and reactive to light. Extraocular motion is intact. Sclera are clear. Oropharynx is clear with moist mucosa.  Neck: Supple, nontender, and with good ROM. No thyromegaly, adenopathy, JVD, or carotid bruits.  Lungs: Clear to auscultation bilaterally.  Cardiovascular: Irregular rate and rhythm. Normal S1, S2. No  murmur . No appreciable  gallops or rubs. Peripheral pulses are palpable. Moderate lower extremity edema. Homan`s test is negative.  Abdomen: Soft, nontender, nondistended. Bowel sounds are present.  Extremities: Good strength, stability, and range of motion of the upper extremities. Good range of motion of the hips and ankles.  Heart: Examination of the heart reveals irregular, rate, and rhythm. There is no murmur noted on ascultation. There is a normal apical pulse.  Lungs: Lungs are clear to auscultation. There is no wheeze, rhonchi, or crackles. There is normal expansion of bilateral chest walls.  Right Knee: Soft tissue swelling: moderate Effusion: none Erythema: none Crepitance: mild Tenderness: medial Alignment: relative varus Mediolateral laxity: medial pseudolaxity Posterior sag: negative Patellar tracking: Good tracking without evidence of subluxation or tilt Atrophy: Generalized quadriceps atrophy. Quadriceps tone was fair. Range of motion: 0/0/80 degrees  Left Knee: Soft tissue swelling: moderate Effusion: none Erythema: none Crepitance: mild Tenderness: medial Alignment: relative varus Mediolateral laxity: medial pseudolaxity Posterior sag: negative Patellar tracking: Good tracking without evidence of subluxation or tilt Atrophy: Generalized quadriceps atrophy. Quadriceps tone was fair. Range of motion: 0/0/78 degrees  Neurologic: Awake, alert, and oriented. Sensory function is intact to pinprick and light touch. Motor strength is judged to be 5/5. Motor coordination is within normal limits. No apparent clonus. No tremor.  X-rays: I ordered and interpreted standing AP, lateral, and sunrise radiographs of the right knee that were obtained in the office today. There is significant narrowing of the medial cartilage space with bone-on-bone articulation associated varus alignment. Osteophyte formation is noted. Subchondral sclerosis is noted. No evidence of  fracture or dislocation.  Dr. Aubry Blase ordered and interpreted standing AP, lateral, and sunrise radiographs of the left knee that were obtained in the office today. There is significant narrowing of the medial cartilage space with bone-on-bone articulation and associated varus alignment. Osteophyte formation is noted. Subchondral sclerosis is noted. No evidence of fracture or dislocation.  Radiology: Xrays of the left knee were ordered and interpreted 10/05/2023, with 4 views using AP, PA flexed view, lateral, and patella views. Xrays revealed the left knee with significant and severe osteoarthritis with medial compartment collapse with bone-on-bone pathology involving the medial compartment. There is osteopenia on x-ray. There is sclerotic changes throughout with osteophyte formation involving the patellofemoral joint and the medial compartment. There is slight erosion of the medial tibial plateau.  Impression: Degenerative arthrosis of both knees, left more symptomatic than right  Plan: The findings were discussed in detail with the patient. The patient was given informational material on total knee replacement. Conservative treatment options were reviewed with the patient. We discussed the risks and benefits of surgical intervention. The usual perioperative course was also discussed in detail. The patient expressed understanding of the risks and benefits of surgical intervention and would like to proceed with plans for left total knee arthroplasty. Before we schedule surgery, I will have the patient optimized by Dr. Braxton Calico (Cardiology). In the meantime I have suggested preoperative physical therapy to work on quadriceps strengthening and conditioning. A referral was placed to [facility] in [place].  The patient's accompanying family member anticipates the need for postoperative rehabilitation in a skilled nursing facility due to current home resources. The patient's husband was recently discharged from  the hospital. I explained in great detail the necessary steps required to qualify a patient for skilled nursing admission. I have strongly recommended making arrangements now to make discharge to home possible.  Dr. Aubry Blase spent a total of 55 minutes in both face-to-face and non-face-to-face  activities, excluding procedures performed, for this visit on the date of this encounter.  MEDICAL CLEARANCE: Per anesthesiology and Dr. Braxton Calico (Cardiology).. ACTIVITY: As tolerated. WORK STATUS: Not applicable. THERAPY: Preoperative physical therapy for quadriceps strengthening and conditioning. MEDICATIONS: Requested Prescriptions  No prescriptions requested or ordered in this encounter  FOLLOW-UP: No follow-ups on file.  This note will serve as the history and physical for surgery scheduled on October 13, 2023 for a left total knee arthroplasty to be done by Dr. Aubry Blase.  JONATHAN T MUNDY PA-C, M.S.  This note was generated in part with voice recognition software and I apologize for any typographical errors that were not detected and corrected.  Electronically signed by Buckner Carder, PA at 10/05/2023 3:48 PM EDT

## 2023-10-13 NOTE — Evaluation (Signed)
 Physical Therapy Evaluation Patient Details Name: Kristin Mitchell MRN: [id number] DOB: 1940/10/30 Today's Date: 10/13/2023  History of Present Illness  Pt is a pleasant 83 y.o. female s/p L TKA on 10/13/23.  Clinical Impression  Pt admitted with above diagnosis. Pt currently with functional limitations due to the deficits listed below (see PT Problem List). Pt received upright in bed agreeable to PT with visitor present. Pt reports PTA she is typically mod-I using her rollator for household and community distances and with dressing/toileting ADL's. She relies on sponge baths for her bathing. Spouse recently placed in SNF after hospitalization so doesn't have 24/7 support/assist currently.   To date, pt is awake and interactive. 2-3 instances where pt appears she may pass out like an orthostatic episode but no notable drops in BP appreciated from supine to sitting.  Reviewed WB status, L knee positioning and reviewed exercises today prior to mobility. Pt is reliant on modA+1 for supine to sit due to difficulty mobilizing LLE. X2 STS efforts needed to fully stand needing bed elevated and modA+1 in order to fully be upright. Pt with significant difficulties taking steps to attempt SPT thus performed lateral steps EOB. Pt unable to take more than 1 step/LE due to difficulty in stance phase on LLE. Pt endorses need to sit. MaxA+1 to return to supine in bed and maxA+2 to scoot up in bed. Polar care re-applied with knee in extension. Pt sat up in bed for safe meal consumption with lunch tray provided. Pt would greatly benefit from skilled PT services < 3 hours/day to address deficits in pain, strength, knee ROM to maximize return to PLOF.      If plan is discharge home, recommend the following: A lot of help with walking and/or transfers;A little help with bathing/dressing/bathroom;Assist for transportation;Help with stairs or ramp for entrance;Assistance with cooking/housework   Can travel by private  vehicle   No    Equipment Recommendations Rolling walker (2 wheels)  Recommendations for Other Services       Functional Status Assessment Patient has had a recent decline in their functional status and demonstrates the ability to make significant improvements in function in a reasonable and predictable amount of time.     Precautions / Restrictions Precautions Precautions: Fall;Knee Precaution Booklet Issued: Yes (comment) Recall of Precautions/Restrictions: Impaired Precaution/Restrictions Comments: needs reminders and re-education for knee positioning Restrictions Weight Bearing Restrictions Per Provider Order: Yes LLE Weight Bearing Per Provider Order: Weight bearing as tolerated      Mobility  Bed Mobility Overal bed mobility: Needs Assistance Bed Mobility: Supine to Sit     Supine to sit: Mod assist, HOB elevated, Used rails     General bed mobility comments: UNable to really assist much. Pt attempts mobilzing RLE towards EOB but unable to assist with LLE. Patient Response: Cooperative  Transfers Overall transfer level: Needs assistance Equipment used: Rolling walker (2 wheels) Transfers: Sit to/from Stand Sit to Stand: Mod assist, Max assist           General transfer comment: MaxA with inability to stand from bed at lowest surface. ABle to stand modA with bed elevated. Extreme difficulty taking R side steps to [facility]. Unable to take more than 1 step/LE. Significant difficulty in stance phase on LLE.    Ambulation/Gait               General Gait Details: deferred for safety  Stairs            Wheelchair Mobility  Tilt Bed Tilt Bed Patient Response: Cooperative  Modified Rankin (Stroke Patients Only)       Balance Overall balance assessment: Needs assistance Sitting-balance support: Bilateral upper extremity supported, Feet supported Sitting balance-Leahy Scale: Fair       Standing balance-Leahy Scale: Poor Standing balance  comment: posterior lean, heavy BUE support                             Pertinent Vitals/Pain Pain Assessment Pain Assessment: Faces Faces Pain Scale: Hurts a little bit Pain Location: L knee Pain Descriptors / Indicators: Aching, Discomfort Pain Intervention(s): Limited activity within patient's tolerance, Monitored during session, Repositioned, Ice applied    Home Living Family/patient expects to be discharged to:: Skilled nursing facility Living Arrangements: Spouse/significant other                 Additional Comments: Spouse unable to assist. Currently in SNF for own medical problems.    Prior Function Prior Level of Function : Independent/Modified Independent             Mobility Comments: mod-I with rollator. Still drives. ADLs Comments: Sponge bathes at baseline. Indep with dressing and toileting.     Extremity/Trunk Assessment   Upper Extremity Assessment Upper Extremity Assessment: Defer to OT evaluation    Lower Extremity Assessment Lower Extremity Assessment: Generalized weakness;LLE deficits/detail LLE Deficits / Details: expected ROM and weakness deficits s/p TKA. Unable to fully DF ankle due to nerve block.       Communication   Communication Communication: No apparent difficulties    Cognition Arousal: Alert, Lethargic Behavior During Therapy: WFL for tasks assessed/performed   PT - Cognitive impairments: No apparent impairments                       PT - Cognition Comments: occasionally lethargic like she is about to fall asleep 2-3x during session. Following commands: Intact       Cueing Cueing Techniques: Verbal cues     General Comments      Exercises Total Joint Exercises Ankle Circles/Pumps: AROM, Strengthening, Both, 10 reps, Supine Quad Sets: AROM, Left, 5 reps, Supine Hip ABduction/ADduction: AAROM, Strengthening, Left, 10 reps, Supine Straight Leg Raises: PROM, Left, Supine, 5 reps Long Arc Quad:  AAROM, Strengthening, Left, 10 reps, Seated Other Exercises Other Exercises: WB status, L knee positioning to prevent knee flexror contracture, HEP   Assessment/Plan    PT Assessment Patient needs continued PT services  PT Problem List Decreased strength;Decreased mobility;Decreased range of motion;Decreased activity tolerance;Decreased balance       PT Treatment Interventions DME instruction;Therapeutic exercise;Gait training;Balance training;Stair training;Neuromuscular re-education;Functional mobility training;Therapeutic activities;Patient/family education    PT Goals (Current goals can be found in the Care Plan section)  Acute Rehab PT Goals Patient Stated Goal: to go to rehab PT Goal Formulation: With patient Time For Goal Achievement: 10/27/23 Potential to Achieve Goals: Good    Frequency BID     Co-evaluation               AM-PAC PT "6 Clicks" Mobility  Outcome Measure Help needed turning from your back to your side while in a flat bed without using bedrails?: Total Help needed moving from lying on your back to sitting on the side of a flat bed without using bedrails?: Total Help needed moving to and from a bed to a chair (including a wheelchair)?: A Lot Help needed standing up from  a chair using your arms (e.g., wheelchair or bedside chair)?: A Lot Help needed to walk in hospital room?: Total Help needed climbing 3-5 steps with a railing? : A Lot 6 Click Score: 9    End of Session Equipment Utilized During Treatment: Gait belt Activity Tolerance: Patient limited by fatigue Patient left: in bed;with call bell/phone within reach;with bed alarm set;with family/visitor present Nurse Communication: Mobility status PT Visit Diagnosis: Muscle weakness (generalized) (M62.81);Difficulty in walking, not elsewhere classified (R26.2);Pain Pain - Right/Left: Left Pain - part of body: Knee    Time: 8657-8469 PT Time Calculation (min) (ACUTE ONLY): 30 min   Charges:    PT Evaluation $PT Eval Low Complexity: 1 Low PT Treatments $Therapeutic Exercise: 8-22 mins PT General Charges $$ ACUTE PT VISIT: 1 Visit       Marc Senior. Fairly IV, PT, DPT Physical Therapist- [place]  [facility] 10/13/2023, 3:34 PM

## 2023-10-13 NOTE — Interval H&P Note (Signed)
 History and Physical Interval Note:  10/13/2023 7:04 AM  Kristin Mitchell  has presented today for surgery, with the diagnosis of Primary osteoarthritis of left knee.  The various methods of treatment have been discussed with the patient and family. After consideration of risks, benefits and other options for treatment, the patient has consented to  Procedure(s): ARTHROPLASTY, KNEE, TOTAL, USING IMAGELESS COMPUTER-ASSISTED NAVIGATION (Left) as a surgical intervention.  The patient's history has been reviewed, patient examined, no change in status, stable for surgery.  I have reviewed the patient's chart and labs.  Questions were answered to the patient's satisfaction.     [name] P [name]

## 2023-10-13 NOTE — Op Note (Signed)
 OPERATIVE NOTE  DATE OF SURGERY:  10/13/2023  PATIENT NAME:  Kristin Mitchell   DOB: 1940-09-01  MRN: [id number]  PRE-OPERATIVE DIAGNOSIS: Degenerative arthrosis of the left knee, primary  POST-OPERATIVE DIAGNOSIS:  Same  PROCEDURE:  Left total knee arthroplasty using computer-assisted navigation  SURGEON:  Maxene Span. M.D.  ASSISTANT:  Benjiman Bras, PA-C (present and scrubbed throughout the case, critical for assistance with exposure, retraction, instrumentation, and closure)  ANESTHESIA: general  ESTIMATED BLOOD LOSS: 50 mL  FLUIDS REPLACED: 750 mL of crystalloid  TOURNIQUET TIME: 100 minutes  DRAINS: 2 medium Hemovac drains  SOFT TISSUE RELEASES: Anterior cruciate ligament, posterior cruciate ligament, deep medial collateral ligament, patellofemoral ligament  IMPLANTS UTILIZED: DePuy Attune size 6 posterior stabilized femoral component (cemented), size 5 rotating platform tibial component (cemented), 35 mm medialized dome patella (cemented), and a 6 mm stabilized rotating platform polyethylene insert.  INDICATIONS FOR SURGERY: Kristin Mitchell is a 83 y.o. year old female with a long history of progressive knee pain. X-rays demonstrated severe degenerative changes in tricompartmental fashion. The patient had not seen any significant improvement despite conservative nonsurgical intervention. After discussion of the risks and benefits of surgical intervention, the patient expressed understanding of the risks benefits and agree with plans for total knee arthroplasty.   The risks, benefits, and alternatives were discussed at length including but not limited to the risks of infection, bleeding, nerve injury, stiffness, blood clots, the need for revision surgery, cardiopulmonary complications, among others, and they were willing to proceed.  PROCEDURE IN DETAIL: The patient was brought into the operating room and, after adequate general anesthesia was achieved, a  tourniquet was placed on the patient's upper thigh. The patient's knee and leg were cleaned and prepped with alcohol and DuraPrep and draped in the usual sterile fashion. A "timeout" was performed as per usual protocol. The lower extremity was exsanguinated using an Esmarch, and the tourniquet was inflated to 300 mmHg. An anterior longitudinal incision was made followed by a standard mid vastus approach. The deep fibers of the medial collateral ligament were elevated in a subperiosteal fashion off of the medial flare of the tibia so as to maintain a continuous soft tissue sleeve. The patella was subluxed laterally and the patellofemoral ligament was incised. Inspection of the knee demonstrated severe degenerative changes with full-thickness loss of articular cartilage. Osteophytes were debrided using a rongeur. Anterior and posterior cruciate ligaments were excised. Two 4.0 mm Schanz pins were inserted in the femur and into the tibia for attachment of the array of trackers used for computer-assisted navigation. Hip center was identified using a circumduction technique. Distal landmarks were mapped using the computer. The distal femur and proximal tibia were mapped using the computer. The distal femoral cutting guide was positioned using computer-assisted navigation so as to achieve a 5 distal valgus cut. The femur was sized and it was felt that a size 6 femoral component was appropriate. A size 6 femoral cutting guide was positioned and the anterior cut was performed and verified using the computer. This was followed by completion of the posterior and chamfer cuts. Femoral cutting guide for the central box was then positioned in the center box cut was performed.  Attention was then directed to the proximal tibia. Medial and lateral menisci were excised. The extramedullary tibial cutting guide was positioned using computer-assisted navigation so as to achieve a 0 varus-valgus alignment and 3 posterior slope. The  cut was performed and verified using the computer. The proximal tibia  was sized and it was felt that a size 5 tibial tray was appropriate. Tibial and femoral trials were inserted followed by insertion of a 6 mm polyethylene insert. This allowed for excellent mediolateral soft tissue balancing both in flexion and in full extension. Finally, the patella was cut and prepared so as to accommodate a 35 mm medialized dome patella. A patella trial was placed and the knee was placed through a range of motion with excellent patellar tracking appreciated. The femoral trial was removed after debridement of posterior osteophytes. The central post-hole for the tibial component was reamed followed by insertion of a keel punch. Tibial trials were then removed. Cut surfaces of bone were irrigated with copious amounts of normal saline using pulsatile lavage and then suctioned dry. Polymethylmethacrylate cement with gentamicin was prepared in the usual fashion using a vacuum mixer. Cement was applied to the cut surface of the proximal tibia as well as along the undersurface of a size 5 rotating platform tibial component. Tibial component was positioned and impacted into place. Excess cement was removed using Personal assistant. Cement was then applied to the cut surfaces of the femur as well as along the posterior flanges of the size 6 femoral component. The femoral component was positioned and impacted into place. Excess cement was removed using Personal assistant. A 6 mm polyethylene trial was inserted and the knee was brought into full extension with steady axial compression applied. Finally, cement was applied to the backside of a 35 mm medialized dome patella and the patellar component was positioned and patellar clamp applied. Excess cement was removed using Personal assistant. After adequate curing of the cement, the tourniquet was deflated after a total tourniquet time of 100 minutes. Hemostasis was achieved using electrocautery. The  knee was irrigated with copious amounts of normal saline using pulsatile lavage followed by 450 ml of Surgiphor and then suctioned dry. 20 mL of 1.3% Exparel  and 60 mL of 0.25% Marcaine  in 40 mL of normal saline was injected along the posterior capsule, medial and lateral gutters, and along the arthrotomy site. A 6 mm stabilized rotating platform polyethylene insert was inserted and the knee was placed through a range of motion with excellent mediolateral soft tissue balancing appreciated and excellent patellar tracking noted. 2 medium drains were placed in the wound bed and brought out through separate stab incisions. The medial parapatellar portion of the incision was reapproximated using interrupted sutures of #1 Vicryl. Subcutaneous tissue was approximated in layers using first #0 Vicryl followed #2-0 Vicryl. The skin was approximated with skin staples. A sterile dressing was applied.  The patient tolerated the procedure well and was transported to the recovery room in stable condition.    Trayton Szabo P. Trace Wirick, Jr., M.D.

## 2023-10-13 NOTE — Anesthesia Procedure Notes (Signed)
 Procedure Name: Intubation Date/Time: 10/13/2023 8:39 AM  Performed by: Ian Maine, RNPre-anesthesia Checklist: Patient identified, Patient being monitored, Timeout performed, Emergency Drugs available and Suction available Patient Re-evaluated:Patient Re-evaluated prior to induction Oxygen Delivery Method: Circle system utilized Preoxygenation: Pre-oxygenation with 100% oxygen Induction Type: IV induction Ventilation: Mask ventilation without difficulty Laryngoscope Size: Mac, 3, McGrath and 4 Grade View: Grade II Tube type: Oral Tube size: 7.0 mm Number of attempts: 1 Airway Equipment and Method: Stylet Placement Confirmation: ETT inserted through vocal cords under direct vision, positive ETCO2 and breath sounds checked- equal and bilateral Secured at: 21 cm Tube secured with: Tape Dental Injury: Teeth and Oropharynx as per pre-operative assessment

## 2023-10-13 NOTE — Consult Note (Signed)
 Pharmacy Consult Note - Anticoagulation  Pharmacy Consult for warfarin Indication: atrial fibrillation  PATIENT MEASUREMENTS: Height: 5\' 2"  (157.5 cm) Weight: 62.1 kg (137 lb) IBW/kg (Calculated) : 50.1 HEPARIN DW (KG): 62.1  VITAL SIGNS: Temp: 97.4 F (36.3 C) (06/04 1400) Temp Source: Temporal (06/04 0727) BP: 163/62 (06/04 1400) Pulse Rate: 51 (06/04 1400)  Recent Labs    10/13/23 0742  LABPROT 21.3*  INR 1.8*    Estimated Creatinine Clearance: 34.2 mL/min (A) (by C-G formula based on SCr of 1.1 mg/dL (H)).  PAST MEDICAL HISTORY: Past Medical History:  Diagnosis Date   Anemia    Aortic atherosclerosis (HCC)    Arthritis    Breast cancer, right (HCC) 11/26/2014   a.) s/p BILATERAL mastectomies   Cerebral microvascular disease    CKD (chronic kidney disease), stage III (HCC)    DDD (degenerative disc disease), cervical    HFrEF (heart failure with reduced ejection fraction) (HCC)    HLD (hyperlipidemia)    HTN (hypertension)    Intermittent palpitations    LBBB (left bundle branch block)    Long term current use of amiodarone    Lumbar stenosis with neurogenic claudication    MVP (mitral valve prolapse)    OAB (overactive bladder)    On warfarin for atrial fibrillation (HCC)    Osteoporosis    Persistent atrial fibrillation (HCC)    a.) CHA2DS2VASc = 6 (age x2, sex, HFrEF, HTN, vascular disease) as of 10/12/2023; b.) rate/rhythm maintained on oral amiodarone + metoprolol ; chronically anticoagulated with warfarin   Seasonal allergies    Senile cataract    Stroke [facility])    a.) noted on brain MRI performed on 03/28/2022: chronic RIGHT parietal lobe infarcts   Valvular insufficiency     ASSESSMENT: 83 y.o. female with PMH including osteoarthritis, atrial fibrillation is presenting for TKA. Patient is on chronic anticoagulation with warfarin, which has been on hold in anticipation of surgery - last dose was 5/30. Current INR is subtherapeutic. CHA2DS2VASc is at  least 7 (HTN, age+2, TIA, PVD, female sex). Pharmacy has been consulted to manage warfarin.  Pertinent medications: Warfarin 2mg  PO daily on Tuesdays and Wednesdays Warfarin 1mg  PO daily Monday, Thursday, Friday, Saturday, Sunday Total weekly dose = 9mg   New drug-drug interactions: Celecoxib - increased chance of bleeding  Goal(s) of therapy: INR 2 - 3 Monitor platelets by anticoagulation protocol: Yes   Baseline anticoagulation labs: Recent Labs    10/13/23 0742  INR 1.8*     PLAN: Administer warfarin 3mg  PO x 1 today Daily INR CBC at least every 7 days  Will M. Alva Jewels, PharmD Clinical Pharmacist 10/13/2023 2:09 PM

## 2023-10-13 NOTE — Anesthesia Preprocedure Evaluation (Addendum)
 Anesthesia Evaluation  Patient identified by MRN, date of birth, ID band Patient awake    Reviewed: Allergy & Precautions, NPO status , Patient's Chart, lab work & pertinent test results  History of Anesthesia Complications Negative for: history of anesthetic complications  Airway Mallampati: III  TM Distance: >3 FB Neck ROM: full    Dental no notable dental hx.    Pulmonary neg pulmonary ROS   Pulmonary exam normal        Cardiovascular hypertension, On Medications + Peripheral Vascular Disease, +CHF and + DOE  Normal cardiovascular exam+ dysrhythmias Atrial Fibrillation + Valvular Problems/Murmurs MVP   Most recent TTE performed on 07/27/2023 revealed a mildly reduced left ventricular systolic function with an EF of 45%. There was mild LVH.  Global hypokinesis was observed.  The left ventricular diastolic Doppler parameters consistent with abnormal relaxation (G1DD). Right ventricular size and function normal with a TAPSE measuring 1.4 cm  (normal range >/= 1.6 cm).  RVSP = 37 mmHg. Left atrium was severely enlarged. There was severe mitral annular calcification. Aortic valve sclerosis was observed. There was mild aortic and moderate pulmonary, mitral, and tricuspid valve regurgitation. All transvalvular gradients were noted to be normal providing no evidence suggestive of valvular stenosis. Aorta normal in size with no evidence of ectasia or aneurysmal dilatation.   Neuro/Psych TIA Neuromuscular disease CVA  negative psych ROS   GI/Hepatic negative GI ROS, Neg liver ROS,,,  Endo/Other    Renal/GU Renal disease     Musculoskeletal   Abdominal   Peds  Hematology  (+) Blood dyscrasia, anemia INR 1.8   Anesthesia Other Findings Past Medical History: No date: Anemia No date: Aortic atherosclerosis (HCC) No date: Arthritis 11/26/2014: Breast cancer, right (HCC)     Comment:  a.) s/p BILATERAL mastectomies No date:  Cerebral microvascular disease No date: CKD (chronic kidney disease), stage III (HCC) No date: DDD (degenerative disc disease), cervical No date: HFrEF (heart failure with reduced ejection fraction) (HCC) No date: HLD (hyperlipidemia) No date: HTN (hypertension) No date: Intermittent palpitations No date: LBBB (left bundle branch block) No date: Long term current use of amiodarone No date: Lumbar stenosis with neurogenic claudication No date: MVP (mitral valve prolapse) No date: OAB (overactive bladder) No date: On warfarin for atrial fibrillation (HCC) No date: Osteoporosis No date: Persistent atrial fibrillation (HCC)     Comment:  a.) CHA2DS2VASc = 6 (age x2, sex, HFrEF, HTN, vascular               disease) as of 10/12/2023; b.) rate/rhythm maintained on               oral amiodarone + metoprolol ; chronically anticoagulated               with warfarin No date: Seasonal allergies No date: Senile cataract No date: Stroke [facility])     Comment:  a.) noted on brain MRI performed on 03/28/2022: chronic               RIGHT parietal lobe infarcts No date: Valvular insufficiency  Past Surgical History: No date: ABDOMINAL HYSTERECTOMY 11/26/2014: CATARACT EXTRACTION W/PHACO; Left     Comment:  Procedure: CATARACT EXTRACTION PHACO AND INTRAOCULAR               LENS PLACEMENT (IOC);  Surgeon: Steven Dingeldein, MD;                Location: [facility] ORS;  Service: Ophthalmology;  Laterality:  Left;  US :01:24 AP:26.6 CDE:40.16 PACk [id number] H No date: CHOLECYSTECTOMY No date: MASTECTOMY; Bilateral  BMI    Body Mass Index: 25.06 kg/m      Reproductive/Obstetrics negative OB ROS                             Anesthesia Physical Anesthesia Plan  ASA: 3  Anesthesia Plan: General ETT   Post-op Pain Management: Gabapentin PO (pre-op)*, Celebrex PO (pre-op)*, Ofirmev  IV (intra-op)* and Dilaudid IV   Induction: Intravenous  PONV Risk Score and  Plan: 3 and Ondansetron , Dexamethasone and Treatment may vary due to age or medical condition  Airway Management Planned: Oral ETT  Additional Equipment:   Intra-op Plan:   Post-operative Plan: Extubation in OR  Informed Consent: I have reviewed the patients History and Physical, chart, labs and discussed the procedure including the risks, benefits and alternatives for the proposed anesthesia with the patient or authorized representative who has indicated his/her understanding and acceptance.     Dental Advisory Given  Plan Discussed with: Anesthesiologist, CRNA and Surgeon  Anesthesia Plan Comments: (Patient consented for risks of anesthesia including but not limited to:  - adverse reactions to medications - damage to eyes, teeth, lips or other oral mucosa - nerve damage due to positioning  - sore throat or hoarseness - Damage to heart, brain, nerves, lungs, other parts of body or loss of life  Patient voiced understanding and assent.)        Anesthesia Quick Evaluation

## 2023-10-13 NOTE — Transfer of Care (Signed)
 Immediate Anesthesia Transfer of Care Note  Patient: Kristin Mitchell  Procedure(s) Performed: ARTHROPLASTY, KNEE, TOTAL, USING IMAGELESS COMPUTER-ASSISTED NAVIGATION (Left: Knee)  Patient Location: PACU  Anesthesia Type:General  Level of Consciousness: drowsy  Airway & Oxygen Therapy: Patient Spontanous Breathing and Patient connected to face mask oxygen  Post-op Assessment: Report given to RN, Post -op Vital signs reviewed and stable, and Patient moving all extremities  Post vital signs: Reviewed and stable  Last Vitals:  Vitals Value Taken Time  BP 154/60 10/13/23 1230  Temp 36.3 C 10/13/23 1226  Pulse 54 10/13/23 1232  Resp 14 10/13/23 1232  SpO2 100 % 10/13/23 1232  Vitals shown include unfiled device data.  Last Pain:  Vitals:   10/13/23 0727  TempSrc: Temporal  PainSc: 8          Complications: No notable events documented.

## 2023-10-13 NOTE — Progress Notes (Signed)
 Patient is not able to walk the distance required to go the bathroom, or he/she is unable to safely negotiate stairs required to access the bathroom.  A 3in1 BSC will alleviate this problem   Amenda Duclos P. Angie Fava M.D.

## 2023-10-14 LAB — CBC
HCT: 32.1 % — ABNORMAL LOW (ref 36.0–46.0)
Hemoglobin: 10.3 g/dL — ABNORMAL LOW (ref 12.0–15.0)
MCH: 31 pg (ref 26.0–34.0)
MCHC: 32.1 g/dL (ref 30.0–36.0)
MCV: 96.7 fL (ref 80.0–100.0)
Platelets: 154 10*3/uL (ref 150–400)
RBC: 3.32 MIL/uL — ABNORMAL LOW (ref 3.87–5.11)
RDW: 14.2 % (ref 11.5–15.5)
WBC: 13.8 10*3/uL — ABNORMAL HIGH (ref 4.0–10.5)
nRBC: 0 % (ref 0.0–0.2)

## 2023-10-14 LAB — PROTIME-INR
INR: 2.1 — ABNORMAL HIGH (ref 0.8–1.2)
Prothrombin Time: 23.9 s — ABNORMAL HIGH (ref 11.4–15.2)

## 2023-10-14 MED ORDER — WARFARIN SODIUM 1 MG PO TABS
1.0000 mg | ORAL_TABLET | Freq: Once | ORAL | Status: AC
  Administered 2023-10-14: 1 mg via ORAL
  Filled 2023-10-14: qty 1

## 2023-10-14 NOTE — Consult Note (Signed)
 Pharmacy Consult Note - Anticoagulation  Pharmacy Consult for warfarin Indication: atrial fibrillation  PATIENT MEASUREMENTS: Height: 5\' 2"  (157.5 cm) Weight: 62.1 kg (137 lb) IBW/kg (Calculated) : 50.1 HEPARIN DW (KG): 62.1  VITAL SIGNS: Temp: 97.5 F (36.4 C) (06/05 0722) Temp Source: Oral (06/05 0722) BP: 160/65 (06/05 0722) Pulse Rate: 53 (06/05 0722)  Recent Labs    10/14/23 0605  HGB 10.3*  HCT 32.1*  PLT 154  LABPROT 23.9*  INR 2.1*    Estimated Creatinine Clearance: 34.2 mL/min (A) (by C-G formula based on SCr of 1.1 mg/dL (H)).  PAST MEDICAL HISTORY: Past Medical History:  Diagnosis Date   Anemia    Aortic atherosclerosis (HCC)    Arthritis    Breast cancer, right (HCC) 11/26/2014   a.) s/p BILATERAL mastectomies   Cerebral microvascular disease    CKD (chronic kidney disease), stage III (HCC)    DDD (degenerative disc disease), cervical    HFrEF (heart failure with reduced ejection fraction) (HCC)    HLD (hyperlipidemia)    HTN (hypertension)    Intermittent palpitations    LBBB (left bundle branch block)    Long term current use of amiodarone    Lumbar stenosis with neurogenic claudication    MVP (mitral valve prolapse)    OAB (overactive bladder)    On warfarin for atrial fibrillation (HCC)    Osteoporosis    Persistent atrial fibrillation (HCC)    a.) CHA2DS2VASc = 6 (age x2, sex, HFrEF, HTN, vascular disease) as of 10/12/2023; b.) rate/rhythm maintained on oral amiodarone + metoprolol ; chronically anticoagulated with warfarin   Seasonal allergies    Senile cataract    Stroke [facility])    a.) noted on brain MRI performed on 03/28/2022: chronic RIGHT parietal lobe infarcts   Valvular insufficiency     ASSESSMENT: 83 y.o. female with PMH including osteoarthritis, atrial fibrillation is presenting for TKA. Patient is on chronic anticoagulation with warfarin, which has been on hold in anticipation of surgery - last dose was 5/30. Current INR is  subtherapeutic. CHA2DS2VASc is at least 7 (HTN, age+2, TIA, PVD, female sex). Pharmacy has been consulted to manage warfarin.  Pertinent medications: Warfarin 2mg  PO daily on Tuesdays and Wednesdays Warfarin 1mg  PO daily Monday, Thursday, Friday, Saturday, Sunday Total weekly dose = 9mg   New drug-drug interactions: Celecoxib - increased chance of bleeding  Goal(s) of therapy: INR 2 - 3 Monitor platelets by anticoagulation protocol: Yes   Baseline anticoagulation labs: Recent Labs    10/13/23 0742 10/14/23 0605  INR 1.8* 2.1*  HGB  --  10.3*  PLT  --  154     PLAN: INR is therapeutic this AM at 2.1 CBC stable - hgb 10.3, plt 154 Give warfarin 1 mg x1 today (home dose) Monitor INR daily while inpatient Monitor signs/symptoms of bleeding CBC at least every 7 days  Thank you for involving pharmacy in this patient's care.   Ananias Balls, PharmD Clinical Pharmacist 10/14/2023 7:34 AM

## 2023-10-14 NOTE — Progress Notes (Signed)
 Physical Therapy Treatment Patient Details Name: Kristin Mitchell MRN: [id number] DOB: November 21, 1940 Today's Date: 10/14/2023   History of Present Illness Pt is a pleasant 83 y.o. female s/p L TKA on 10/13/23.    PT Comments  Pt received upright in chair agreeable to PT. Pt reports no pain. Time spent reviewing HEP (reps/sets/frequency) and getting AROM. Began with exercises as listed below. Pt requiring minA+1 for STS and VC's for hand placement. Ambulating slowed cadence only 10' with L sided antalgic gait, L hip IR, and midfoot contact at initial contact of gait with limited carryover to correct with VC's and education. Pt needs close chair follow ultimately reporting need for sitting rest due to LE fatigue. Pt placed in recliner with L knee extended. Use of towel roll to bias neutral hip positioning along with knee in extension and polar care donned. Pt with all needs in reach with d/c recs remaining appropriate.    If plan is discharge home, recommend the following: A lot of help with walking and/or transfers;A little help with bathing/dressing/bathroom;Assist for transportation;Help with stairs or ramp for entrance;Assistance with cooking/housework   Can travel by private vehicle     No  Equipment Recommendations  Rolling walker (2 wheels)    Recommendations for Other Services       Precautions / Restrictions Precautions Precautions: Fall;Knee Precaution Booklet Issued: Yes (comment) Recall of Precautions/Restrictions: Impaired Restrictions Weight Bearing Restrictions Per Provider Order: Yes LLE Weight Bearing Per Provider Order: Weight bearing as tolerated     Mobility  Bed Mobility               General bed mobility comments: not tested Patient Response: Cooperative  Transfers Overall transfer level: Needs assistance Equipment used: Rolling walker (2 wheels) Transfers: Sit to/from Stand Sit to Stand: Min assist           General transfer comment: VC's for  hand placement to stand.    Ambulation/Gait Ambulation/Gait assistance: Contact guard assist Gait Distance (Feet): 10 Feet Assistive device: Rolling walker (2 wheels) Gait Pattern/deviations: Step-to pattern       General Gait Details: Pt with L hip IR and initial contact at mid foot. Very slwoed gait needing close chair follow for safety. Unable to correct L hip IR with VC's.   Stairs             Wheelchair Mobility     Tilt Bed Tilt Bed Patient Response: Cooperative  Modified Rankin (Stroke Patients Only)       Balance Overall balance assessment: Needs assistance Sitting-balance support: Feet supported, No upper extremity supported Sitting balance-Leahy Scale: Fair     Standing balance support: Bilateral upper extremity supported Standing balance-Leahy Scale: Fair Standing balance comment: neutral standing balance. Light UE support statically                            Communication Communication Communication: No apparent difficulties  Cognition Arousal: Alert Behavior During Therapy: WFL for tasks assessed/performed   PT - Cognitive impairments: No apparent impairments                         Following commands: Intact      Cueing Cueing Techniques: Verbal cues  Exercises Total Joint Exercises Ankle Circles/Pumps: AROM, Strengthening, Both, 10 reps, Supine Quad Sets: AROM, Left, Supine, 10 reps Short Arc Quad: AROM, Strengthening, Left, 10 reps, Supine Heel Slides: AROM, Strengthening, Left, 10 reps,  Supine Hip ABduction/ADduction: AAROM, Strengthening, Left, 10 reps, Supine Straight Leg Raises: AAROM, Strengthening, Left, 10 reps, Seated Goniometric ROM: 10-66 degrees    General Comments        Pertinent Vitals/Pain Pain Assessment Pain Assessment: No/denies pain    Home Living Family/patient expects to be discharged to:: Skilled nursing facility                   Additional Comments: Spouse unable to  assist. Currently in SNF for own medical problems.    Prior Function            PT Goals (current goals can now be found in the care plan section) Acute Rehab PT Goals Patient Stated Goal: to go to rehab PT Goal Formulation: With patient Time For Goal Achievement: 10/27/23 Potential to Achieve Goals: Good Progress towards PT goals: Progressing toward goals    Frequency    BID      PT Plan      Co-evaluation              AM-PAC PT "6 Clicks" Mobility   Outcome Measure  Help needed turning from your back to your side while in a flat bed without using bedrails?: A Lot Help needed moving from lying on your back to sitting on the side of a flat bed without using bedrails?: A Lot Help needed moving to and from a bed to a chair (including a wheelchair)?: A Lot Help needed standing up from a chair using your arms (e.g., wheelchair or bedside chair)?: A Little Help needed to walk in hospital room?: A Lot Help needed climbing 3-5 steps with a railing? : Total 6 Click Score: 12    End of Session Equipment Utilized During Treatment: Gait belt Activity Tolerance: Patient limited by fatigue Patient left: in chair;with call bell/phone within reach Nurse Communication: Mobility status PT Visit Diagnosis: Muscle weakness (generalized) (M62.81);Difficulty in walking, not elsewhere classified (R26.2);Pain Pain - Right/Left: Left Pain - part of body: Knee     Time: 9811-9147 PT Time Calculation (min) (ACUTE ONLY): 27 min  Charges:    $Gait Training: 8-22 mins $Therapeutic Exercise: 8-22 mins PT General Charges $$ ACUTE PT VISIT: 1 Visit                     Marc Senior. Fairly IV, PT, DPT Physical Therapist- [place]  [facility] 10/14/2023, 11:49 AM

## 2023-10-14 NOTE — Plan of Care (Signed)
  Problem: Activity: Goal: Risk for activity intolerance will decrease Outcome: Progressing   Problem: Coping: Goal: Level of anxiety will decrease Outcome: Progressing   Problem: Pain Management: Goal: Pain level will decrease with appropriate interventions Outcome: Progressing

## 2023-10-14 NOTE — TOC Initial Note (Signed)
 Transition of Care [facility]) - Initial/Assessment Note    Patient Details  Name: Kristin Mitchell MRN: [id number] Date of Birth: Jan 02, 1941  Transition of Care [facility]) CM/SW Contact:    Alexandra Ice, RN Phone Number: 10/14/2023, 8:59 AM  Clinical Narrative:                 Met with patient at bedside, discussed discharge plan. She states her husband is at [employer] and she does not have assistance at home [facility] discussed other options such has home health with paid caregiver support. She wants to go to [facility], she is willing to pay out of pocket. Sent message to [place] at [employer] regarding situation and referral, awaiting response. Updated MD and bedside nurse.  Expected Discharge Plan: Skilled Nursing Facility     Patient Goals and CMS Choice Patient states their goals for this hospitalization and ongoing recovery are:: I have no help at home, I want to go to [facility] CMS Medicare.gov Compare Post Acute Care list provided to:: Patient Choice offered to / list presented to : Patient      Expected Discharge Plan and Services   Discharge Planning Services: CM Consult Post Acute Care Choice: Skilled Nursing Facility Living arrangements for the past 2 months: Single Family Home                   DME Agency: NA       HH Arranged: NA          Prior Living Arrangements/Services Living arrangements for the past 2 months: Single Family Home Lives with:: Self Patient language and need for interpreter reviewed:: Yes Do you feel safe going back to the place where you live?: Yes      Need for Family Participation in Patient Care: No (Comment) Care giver support system in place?: No (comment) Current home services: DME (RW) Criminal Activity/Legal Involvement Pertinent to Current Situation/Hospitalization: No - Comment as needed  Activities of Daily Living   ADL Screening (condition at time of admission) Independently performs ADLs?: Yes (appropriate for developmental  age) Is the patient deaf or have difficulty hearing?: No Does the patient have difficulty seeing, even when wearing glasses/contacts?: No Does the patient have difficulty concentrating, remembering, or making decisions?: No  Permission Sought/Granted                  Emotional Assessment Appearance:: Appears stated age Attitude/Demeanor/Rapport: Engaged Affect (typically observed): Appropriate Orientation: : Oriented to Self, Oriented to Place, Oriented to  Time, Oriented to Situation Alcohol / Substance Use: Not Applicable Psych Involvement: No (comment)  Admission diagnosis:  Primary osteoarthritis of left knee [M17.12] History of total knee arthroplasty, left [Z96.652] Patient Active Problem List   Diagnosis Date Noted   History of total knee arthroplasty, left 10/13/2023   Orthostatic hypotension 04/20/2023   Varicose veins of leg with swelling, bilateral 01/19/2023   Lymphedema 12/10/2022   OAB (overactive bladder) 04/14/2022   Prediabetes 04/14/2022   Chronic atrial fibrillation with RVR (HCC) 03/28/2022   Chronic kidney disease, stage 3a (HCC) 03/28/2022   Syncope 03/28/2022   Closed dislocation of right shoulder 03/28/2022   Chronic systolic CHF (congestive heart failure) (HCC) 03/28/2022   Paroxysmal atrial fibrillation with RVR (HCC) 02/20/2022   Atrial fibrillation with RVR (HCC) 01/15/2022   UTI (urinary tract infection) 01/15/2022   Sepsis (HCC) 01/15/2022   Hypokalemia 01/15/2022   Hypomagnesemia 01/15/2022   Myocardial injury 01/15/2022   Acute on chronic combined systolic and diastolic  CHF (congestive heart failure) (HCC) 01/15/2022   Abnormal LFTs 01/15/2022   Fall at home, initial encounter 01/15/2022   CKD (chronic kidney disease) stage 2, GFR 60-89 ml/min 12/30/2021   Subtherapeutic international normalized ratio (INR) 12/29/2021   Paroxysmal atrial fibrillation (HCC) 12/29/2021   TIA (transient ischemic attack) 12/28/2021   DOE (dyspnea on  exertion) 03/27/2021   Adnexal cyst 03/06/2020   Right renal mass 02/20/2020   Osteopenia of spine 02/20/2020   Goals of care, counseling/discussion 02/20/2020   Chronic bilateral low back pain with bilateral sciatica 01/11/2020   Lumbar stenosis without neurogenic claudication 01/11/2020   Primary osteoarthritis of both hips 12/19/2019   Normocytic anemia 12/07/2019   Hyponatremia 12/07/2019   Pain in limb 10/10/2019   PVD (peripheral vascular disease) (HCC) 09/25/2019   Osteoporosis without current pathological fracture 11/25/2018   Closed fracture of mandible (HCC) 07/01/2018   Impingement syndrome, shoulder, right 05/26/2018   Obesity (BMI 30-39.9) 08/10/2017   Personal history of other malignant neoplasm of skin 05/14/2017   Chest tightness 04/22/2017   Premature atrial contractions 01/06/2017   Premature ventricular contractions 01/06/2017   Gonalgia 06/10/2015   Arthritis of knee, degenerative 06/10/2015   Primary osteoarthritis of left knee 06/10/2015   Primary osteoarthritis of right knee 06/10/2015   Essential hypertension 05/10/2015   Breast cancer of upper-outer quadrant of right female breast (HCC) 11/26/2014   Primary osteoarthritis of both knees 05/01/2014   H/O bilateral mastectomy 05/01/2014   H/O malignant neoplasm of breast 05/01/2014   Hypercholesteremia 05/01/2014   Hypercholesterolemia 05/01/2014   PCP:  Efraim Grange, NP Pharmacy:   [facility] [address] [place] - [address] [place] [address] Phone: [phone] Fax: [phone]     Social Drivers of Health (SDOH) Social History: SDOH Screenings   Food Insecurity: No Food Insecurity (10/13/2023)  Housing: Low Risk  (10/13/2023)  Transportation Needs: No Transportation Needs (10/13/2023)  Utilities: Not At Risk (10/13/2023)  Financial Resource Strain: Low Risk  (04/20/2023)   Received from [facility]  Social Connections: Socially Integrated (10/13/2023)   Tobacco Use: Low Risk  (10/13/2023)   SDOH Interventions:     Readmission Risk Interventions     No data to display

## 2023-10-14 NOTE — Care Management Obs Status (Signed)
 MEDICARE OBSERVATION STATUS NOTIFICATION   Patient Details  Name: Kristin Mitchell MRN: [id number] Date of Birth: 1941/02/23   Medicare Observation Status Notification Given:  No (patient did not want a copy)    Anise Kerns 10/14/2023, 12:29 PM

## 2023-10-14 NOTE — TOC Progression Note (Signed)
 Transition of Care [facility]) - Progression Note    Patient Details  Name: Kristin Mitchell MRN: [id number] Date of Birth: Apr 15, 1941  Transition of Care [facility]) CM/SW Contact  Alexandra Ice, RN Phone Number: 10/14/2023, 8:39 AM  Clinical Narrative:     Patient is recommended for SNF for additional rehab prior to returning home. PASRR completed. FL2 pending MD signature. Referral sent to [facility], as this is patient choice.        Expected Discharge Plan and Services                                               Social Determinants of Health (SDOH) Interventions SDOH Screenings   Food Insecurity: No Food Insecurity (10/13/2023)  Housing: Low Risk  (10/13/2023)  Transportation Needs: No Transportation Needs (10/13/2023)  Utilities: Not At Risk (10/13/2023)  Financial Resource Strain: Low Risk  (04/20/2023)   Received from [facility]  Social Connections: Socially Integrated (10/13/2023)  Tobacco Use: Low Risk  (10/13/2023)    Readmission Risk Interventions     No data to display

## 2023-10-14 NOTE — NC FL2 (Signed)
 [place]  MEDICAID FL2 LEVEL OF CARE FORM     IDENTIFICATION  Patient Name: Kristin Mitchell Birthdate: [birth year] Sex: female Admission Date (Current Location): 10/13/2023  [facility] [place] Number:  Chiropodist and Address:  [facility], [address], [place], [place] [address]      Provider Number: [id number]  Attending Physician Name and Address:  Arlyne Lame, MD  Relative Name and Phone Number:  Spouse: Antara Brecheisen [phone]    Current Level of Care: Hospital Recommended Level of Care: Skilled Nursing Facility Prior Approval Number:    Date Approved/Denied:   PASRR Number: [id number]  Discharge Plan: SNF    Current Diagnoses: Patient Active Problem List   Diagnosis Date Noted   History of total knee arthroplasty, left 10/13/2023   Orthostatic hypotension 04/20/2023   Varicose veins of leg with swelling, bilateral 01/19/2023   Lymphedema 12/10/2022   OAB (overactive bladder) 04/14/2022   Prediabetes 04/14/2022   Chronic atrial fibrillation with RVR (HCC) 03/28/2022   Chronic kidney disease, stage 3a (HCC) 03/28/2022   Syncope 03/28/2022   Closed dislocation of right shoulder 03/28/2022   Chronic systolic CHF (congestive heart failure) (HCC) 03/28/2022   Paroxysmal atrial fibrillation with RVR (HCC) 02/20/2022   Atrial fibrillation with RVR (HCC) 01/15/2022   UTI (urinary tract infection) 01/15/2022   Sepsis (HCC) 01/15/2022   Hypokalemia 01/15/2022   Hypomagnesemia 01/15/2022   Myocardial injury 01/15/2022   Acute on chronic combined systolic and diastolic CHF (congestive heart failure) (HCC) 01/15/2022   Abnormal LFTs 01/15/2022   Fall at home, initial encounter 01/15/2022   CKD (chronic kidney disease) stage 2, GFR 60-89 ml/min 12/30/2021   Subtherapeutic international normalized ratio (INR) 12/29/2021   Paroxysmal atrial fibrillation (HCC) 12/29/2021   TIA (transient ischemic attack) 12/28/2021   DOE  (dyspnea on exertion) 03/27/2021   Adnexal cyst 03/06/2020   Right renal mass 02/20/2020   Osteopenia of spine 02/20/2020   Goals of care, counseling/discussion 02/20/2020   Chronic bilateral low back pain with bilateral sciatica 01/11/2020   Lumbar stenosis without neurogenic claudication 01/11/2020   Primary osteoarthritis of both hips 12/19/2019   Normocytic anemia 12/07/2019   Hyponatremia 12/07/2019   Pain in limb 10/10/2019   PVD (peripheral vascular disease) (HCC) 09/25/2019   Osteoporosis without current pathological fracture 11/25/2018   Closed fracture of mandible (HCC) 07/01/2018   Impingement syndrome, shoulder, right 05/26/2018   Obesity (BMI 30-39.9) 08/10/2017   Personal history of other malignant neoplasm of skin 05/14/2017   Chest tightness 04/22/2017   Premature atrial contractions 01/06/2017   Premature ventricular contractions 01/06/2017   Gonalgia 06/10/2015   Arthritis of knee, degenerative 06/10/2015   Primary osteoarthritis of left knee 06/10/2015   Primary osteoarthritis of right knee 06/10/2015   Essential hypertension 05/10/2015   Breast cancer of upper-outer quadrant of right female breast (HCC) 11/26/2014   Primary osteoarthritis of both knees 05/01/2014   H/O bilateral mastectomy 05/01/2014   H/O malignant neoplasm of breast 05/01/2014   Hypercholesteremia 05/01/2014   Hypercholesterolemia 05/01/2014    Orientation RESPIRATION BLADDER Height & Weight     Self, Time, Situation, Place  Normal Continent Weight: 62.1 kg Height:  5\' 2"  (157.5 cm)  BEHAVIORAL SYMPTOMS/MOOD NEUROLOGICAL BOWEL NUTRITION STATUS      Continent Diet (Regular Diet, Thin liquid)  AMBULATORY STATUS COMMUNICATION OF NEEDS Skin   Limited Assist Verbally Surgical wounds  Personal Care Assistance Level of Assistance  Bathing, Feeding, Dressing Bathing Assistance: Limited assistance Feeding assistance: Limited assistance Dressing Assistance: Limited  assistance     Functional Limitations Info             SPECIAL CARE FACTORS FREQUENCY  PT (By licensed PT), OT (By licensed OT)     PT Frequency: 5 times per week OT Frequency: 5 times per week            Contractures Contractures Info: Not present    Additional Factors Info  Code Status, Allergies Code Status Info: Full Code Allergies Info: Pencillins           Current Medications (10/14/2023):  This is the current hospital active medication list Current Facility-Administered Medications  Medication Dose Route Frequency Provider Last Rate Last Admin   0.9 %  sodium chloride  infusion   Intravenous Continuous Hooten, Robbie Chiles, MD   Stopped at 10/14/23 0307   acetaminophen  (OFIRMEV ) IV 1,000 mg  1,000 mg Intravenous Q6H Hooten, Robbie Chiles, MD   Stopped at 10/14/23 1610   acetaminophen  (TYLENOL ) tablet 325-650 mg  325-650 mg Oral Q6H PRN Hooten, James P, MD       alum & mag hydroxide-simeth (MAALOX/MYLANTA) 200-200-20 MG/5ML suspension 30 mL  30 mL Oral Q4H PRN Hooten, Robbie Chiles, MD       bisacodyl (DULCOLAX) suppository 10 mg  10 mg Rectal Daily PRN Hooten, Robbie Chiles, MD       celecoxib (CELEBREX) capsule 200 mg  200 mg Oral BID Hooten, James P, MD   200 mg at 10/13/23 2116   diphenhydrAMINE (BENADRYL) 12.5 MG/5ML elixir 12.5-25 mg  12.5-25 mg Oral Q4H PRN Hooten, Robbie Chiles, MD       ferrous sulfate tablet 325 mg  325 mg Oral BID WC Hooten, Robbie Chiles, MD   325 mg at 10/14/23 9604   HYDROmorphone (DILAUDID) injection 0.5-1 mg  0.5-1 mg Intravenous Q4H PRN Hooten, James P, MD       magnesium  hydroxide (MILK OF MAGNESIA) suspension 30 mL  30 mL Oral Daily Hooten, Robbie Chiles, MD       menthol-cetylpyridinium (CEPACOL) lozenge 3 mg  1 lozenge Oral PRN Hooten, Robbie Chiles, MD       Or   phenol (CHLORASEPTIC) mouth spray 1 spray  1 spray Mouth/Throat PRN Hooten, Robbie Chiles, MD       metoCLOPramide (REGLAN) tablet 10 mg  10 mg Oral TID AC & HS Hooten, James P, MD   10 mg at 10/14/23 5409   ondansetron   (ZOFRAN ) tablet 4 mg  4 mg Oral Q6H PRN Hooten, Robbie Chiles, MD       Or   ondansetron  (ZOFRAN ) injection 4 mg  4 mg Intravenous Q6H PRN Hooten, Robbie Chiles, MD       Oral care mouth rinse  15 mL Mouth Rinse PRN Hooten, Robbie Chiles, MD       oxyCODONE  (Oxy IR/ROXICODONE ) immediate release tablet 10 mg  10 mg Oral Q4H PRN Hooten, Robbie Chiles, MD       oxyCODONE  (Oxy IR/ROXICODONE ) immediate release tablet 5 mg  5 mg Oral Q4H PRN Hooten, Robbie Chiles, MD       pantoprazole (PROTONIX) EC tablet 40 mg  40 mg Oral BID Hooten, James P, MD   40 mg at 10/13/23 2116   senna-docusate (Senokot-S) tablet 1 tablet  1 tablet Oral BID Hooten, James P, MD   1 tablet at 10/13/23 2116   sodium  phosphate (FLEET) enema 1 enema  1 enema Rectal Once PRN Hooten, Robbie Chiles, MD       traMADol [place] Sick) tablet 50-100 mg  50-100 mg Oral Q4H PRN Hooten, Robbie Chiles, MD       warfarin (COUMADIN ) tablet 1 mg  1 mg Oral ONCE-1600 Ananias Balls, [facility]       Warfarin - Pharmacist Dosing Inpatient   Does not apply q1600 Madelynn Schilder, RPH         Discharge Medications: Please see discharge summary for a list of discharge medications.  Relevant Imaging Results:  Relevant Lab Results:   Additional Information SSN: [id number]  Alexandra Ice, RN

## 2023-10-14 NOTE — Plan of Care (Signed)

## 2023-10-14 NOTE — Progress Notes (Signed)
 Subjective: 1 Day Post-Op Procedure(s) (LRB): ARTHROPLASTY, KNEE, TOTAL, USING IMAGELESS COMPUTER-ASSISTED NAVIGATION (Left) Patient reports pain as mild and moderate.   Patient seen in rounds with Dr. Aubry Blase. Patient is well, and has had no acute complaints or problems Denies any CP, SOB, N/V, fevers or chills We will continue with therapy today.  Plan is to go Skilled nursing facility after hospital stay.  Objective: Vital signs in last 24 hours: Temp:  [97 F (36.1 C)-97.5 F (36.4 C)] 97.5 F (36.4 C) (06/05 0722) Pulse Rate:  [50-57] 53 (06/05 0722) Resp:  [10-18] 15 (06/05 0722) BP: (144-172)/(45-67) 160/65 (06/05 0722) SpO2:  [96 %-100 %] 100 % (06/05 0722)  Intake/Output from previous day:  Intake/Output Summary (Last 24 hours) at 10/14/2023 0833 Last data filed at 10/14/2023 0422 Gross per 24 hour  Intake 2718.43 ml  Output 415 ml  Net 2303.43 ml    Intake/Output this shift: No intake/output data recorded.  Labs: Recent Labs    10/14/23 0605  HGB 10.3*   Recent Labs    10/14/23 0605  WBC 13.8*  RBC 3.32*  HCT 32.1*  PLT 154   No results for input(s): "NA", "K", "CL", "CO2", "BUN", "CREATININE", "GLUCOSE", "CALCIUM " in the last 72 hours. Recent Labs    10/13/23 0742 10/14/23 0605  INR 1.8* 2.1*    EXAM General - Patient is Alert, Appropriate, and Oriented Extremity - Neurologically intact Neurovascular intact Sensation intact distally Intact pulses distally Dorsiflexion/Plantar flexion intact No cellulitis present Compartment soft Dressing - dressing C/D/I and no drainage Motor Function - intact, moving foot and toes well on exam.  JP Drain remains in place for increased drainage  Past Medical History:  Diagnosis Date   Anemia    Aortic atherosclerosis (HCC)    Arthritis    Breast cancer, right (HCC) 11/26/2014   a.) s/p BILATERAL mastectomies   Cerebral microvascular disease    CKD (chronic kidney disease), stage III (HCC)    DDD  (degenerative disc disease), cervical    HFrEF (heart failure with reduced ejection fraction) (HCC)    HLD (hyperlipidemia)    HTN (hypertension)    Intermittent palpitations    LBBB (left bundle branch block)    Long term current use of amiodarone    Lumbar stenosis with neurogenic claudication    MVP (mitral valve prolapse)    OAB (overactive bladder)    On warfarin for atrial fibrillation (HCC)    Osteoporosis    Persistent atrial fibrillation (HCC)    a.) CHA2DS2VASc = 6 (age x2, sex, HFrEF, HTN, vascular disease) as of 10/12/2023; b.) rate/rhythm maintained on oral amiodarone + metoprolol ; chronically anticoagulated with warfarin   Seasonal allergies    Senile cataract    Stroke [facility])    a.) noted on brain MRI performed on 03/28/2022: chronic RIGHT parietal lobe infarcts   Valvular insufficiency     Assessment/Plan: 1 Day Post-Op Procedure(s) (LRB): ARTHROPLASTY, KNEE, TOTAL, USING IMAGELESS COMPUTER-ASSISTED NAVIGATION (Left) Principal Problem:   History of total knee arthroplasty, left  Estimated body mass index is 25.06 kg/m as calculated from the following:   Height as of this encounter: 5\' 2"  (1.575 m).   Weight as of this encounter: 62.1 kg. 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 Patient will continue to work with physical therapy to pass postoperative PT protocols, ROM and strengthening.  Patient's husband at home was recently discharged from hospital, and is unable to help with care of patient postoperatively.  Given the patient picture and  limitations, find it best that she remain in the hospital, potential inpatient admission with SNF placement/workup.  Discussed with the patient continuing to utilize Polar Care  Patient will use bone foam in 20-30 minute intervals  Patient will wear TED hose bilaterally to help prevent DVT and clot formation  Discussed the Aquacel bandage.  This bandage will stay in place 7 days postoperatively.  Can be replaced with honeycomb bandages that will be sent home with the patient  Discussed sending the patient home with tramadol and oxycodone  for as needed pain management.  Patient will also be sent home with Celebrex to help with swelling and inflammation.  Patient will take at home warfarin for DVT prophylaxis  JP drain remains in place due to increased drainage  Weight-Bearing as tolerated to left leg  Patient will follow-up with [facility] clinic orthopedics in 2 weeks for staple removal and reevaluation  Wadie Guile, PA-C [facility] 10/14/2023, 8:33 AM

## 2023-10-14 NOTE — Progress Notes (Signed)
 Physical Therapy Treatment Patient Details Name: Kristin Mitchell MRN: [id number] DOB: 1940-07-16 Today's Date: 10/14/2023   History of Present Illness Pt is a pleasant 83 y.o. female s/p L TKA on 10/13/23.    PT Comments  Pt received for second, afternoon session. Pt is making slow progress able to stand at [facility] to RW and ambulate 15'. Continues to need CGA, chair follow, and VC's for L foot positioning. Pt also continues to have very slowed gait cadence. Pt returns to recliner back to bedside performing SPT to RW to EOB at [facility] and modA at LE's to return to supine. Pt with polar care donned and L knee extended. Pt with slow progress in POC thus reducing frequency to QD with d/c recs remaining appropriate.     If plan is discharge home, recommend the following: A lot of help with walking and/or transfers;A little help with bathing/dressing/bathroom;Assist for transportation;Help with stairs or ramp for entrance;Assistance with cooking/housework   Can travel by private vehicle     No  Equipment Recommendations  Rolling walker (2 wheels)    Recommendations for Other Services       Precautions / Restrictions Precautions Precautions: Fall;Knee Precaution Booklet Issued: Yes (comment) Recall of Precautions/Restrictions: Impaired Precaution/Restrictions Comments: needs reminders and re-education for knee positioning Restrictions Weight Bearing Restrictions Per Provider Order: Yes LLE Weight Bearing Per Provider Order: Weight bearing as tolerated     Mobility  Bed Mobility Overal bed mobility: Needs Assistance Bed Mobility: Sit to Supine       Sit to supine: Mod assist   General bed mobility comments: at LE's Patient Response: Cooperative  Transfers Overall transfer level: Needs assistance Equipment used: Rolling walker (2 wheels) Transfers: Sit to/from Stand Sit to Stand: Contact guard assist           General transfer comment: VC's for hand placement to stand.     Ambulation/Gait Ambulation/Gait assistance: Contact guard assist Gait Distance (Feet): 15 Feet Assistive device: Rolling walker (2 wheels) Gait Pattern/deviations: Step-to pattern       General Gait Details: Pt has some improvements in neutral hip alignment in LLE. Continues to need close chair follow   Stairs             Wheelchair Mobility     Tilt Bed Tilt Bed Patient Response: Cooperative  Modified Rankin (Stroke Patients Only)       Balance Overall balance assessment: Needs assistance Sitting-balance support: Feet supported, No upper extremity supported Sitting balance-Leahy Scale: Fair     Standing balance support: Bilateral upper extremity supported Standing balance-Leahy Scale: Fair Standing balance comment: neutral standing balance. Light UE support statically                            Communication Communication Communication: No apparent difficulties  Cognition Arousal: Alert Behavior During Therapy: WFL for tasks assessed/performed   PT - Cognitive impairments: No apparent impairments                         Following commands: Intact      Cueing Cueing Techniques: Verbal cues  Exercises Total Joint Exercises Ankle Circles/Pumps: AROM, Strengthening, Both, 10 reps, Supine Quad Sets: AROM, Left, Supine, 10 reps Short Arc Quad: AROM, Strengthening, Left, 10 reps, Supine Heel Slides: AROM, Strengthening, Left, 10 reps, Supine Hip ABduction/ADduction: AAROM, Strengthening, Left, 10 reps, Supine Straight Leg Raises: AAROM, Strengthening, Left, 10 reps, Seated Goniometric ROM:  10-66 degrees    General Comments        Pertinent Vitals/Pain Pain Assessment Pain Assessment: No/denies pain Faces Pain Scale: Hurts a little bit Pain Location: L knee Pain Descriptors / Indicators: Aching, Discomfort Pain Intervention(s): Limited activity within patient's tolerance    Home Living                           Prior Function            PT Goals (current goals can now be found in the care plan section) Acute Rehab PT Goals Patient Stated Goal: to go to rehab PT Goal Formulation: With patient Time For Goal Achievement: 10/27/23 Potential to Achieve Goals: Good Progress towards PT goals: Progressing toward goals    Frequency    7X/week      PT Plan      Co-evaluation              AM-PAC PT "6 Clicks" Mobility   Outcome Measure  Help needed turning from your back to your side while in a flat bed without using bedrails?: A Lot Help needed moving from lying on your back to sitting on the side of a flat bed without using bedrails?: A Lot Help needed moving to and from a bed to a chair (including a wheelchair)?: A Little Help needed standing up from a chair using your arms (e.g., wheelchair or bedside chair)?: A Little Help needed to walk in hospital room?: A Lot Help needed climbing 3-5 steps with a railing? : Total 6 Click Score: 13    End of Session Equipment Utilized During Treatment: Gait belt Activity Tolerance: Patient limited by fatigue Patient left: in bed;with call bell/phone within reach;with bed alarm set Nurse Communication: Mobility status PT Visit Diagnosis: Muscle weakness (generalized) (M62.81);Difficulty in walking, not elsewhere classified (R26.2);Pain Pain - Right/Left: Left Pain - part of body: Knee     Time: 1345-1409 PT Time Calculation (min) (ACUTE ONLY): 24 min  Charges:    $Gait Training: 23-37 mins PT General Charges $$ ACUTE PT VISIT: 1 Visit                    Marc Senior. Fairly IV, PT, DPT Physical Therapist- [facility]  10/14/2023, 4:00 PM

## 2023-10-14 NOTE — Evaluation (Signed)
 Occupational Therapy Evaluation Patient Details Name: Kristin Mitchell MRN: [id number] DOB: 1940-10-07 Today's Date: 10/14/2023   History of Present Illness   Pt is a pleasant 83 y.o. female s/p L TKA on 10/13/23.     Clinical Impressions Kristin Mitchell was seen for OT evaluation this date. Prior to hospital admission, pt was MOD I using rollator. Pt lives with spouse who is at [facility] currently. Pt currently requires MAX A don underwear. MIN A + RW for BSC t/f. Reviewed polar care and HEP. Pt would benefit from skilled OT to address noted impairments and functional limitations (see below for any additional details). Upon hospital discharge, recommend OT follow up.     If plan is discharge home, recommend the following:   A little help with walking and/or transfers;A lot of help with bathing/dressing/bathroom;Help with stairs or ramp for entrance     Functional Status Assessment   Patient has had a recent decline in their functional status and demonstrates the ability to make significant improvements in function in a reasonable and predictable amount of time.     Equipment Recommendations   BSC/3in1     Recommendations for Other Services         Precautions/Restrictions   Precautions Precautions: Fall;Knee Restrictions Weight Bearing Restrictions Per Provider Order: Yes LLE Weight Bearing Per Provider Order: Weight bearing as tolerated     Mobility Bed Mobility               General bed mobility comments: not tested    Transfers Overall transfer level: Needs assistance Equipment used: Rolling walker (2 wheels) Transfers: Sit to/from Stand Sit to Stand: Min assist                  Balance Overall balance assessment: Needs assistance Sitting-balance support: Feet supported, No upper extremity supported Sitting balance-Leahy Scale: Fair     Standing balance support: Bilateral upper extremity supported Standing balance-Leahy Scale: Poor Standing  balance comment: posterior lean                           ADL either performed or assessed with clinical judgement   ADL Overall ADL's : Needs assistance/impaired                                       General ADL Comments: MAX A don underwear. MIN A + RW for [facility] t/f     Vision         Perception         Praxis         Pertinent Vitals/Pain Pain Assessment Pain Assessment: Faces Faces Pain Scale: Hurts a little bit Pain Location: L knee Pain Descriptors / Indicators: Aching, Discomfort Pain Intervention(s): Limited activity within patient's tolerance     Extremity/Trunk Assessment Upper Extremity Assessment Upper Extremity Assessment: Overall WFL for tasks assessed   Lower Extremity Assessment Lower Extremity Assessment: Generalized weakness       Communication Communication Communication: No apparent difficulties   Cognition Arousal: Alert Behavior During Therapy: WFL for tasks assessed/performed Cognition: No family/caregiver present to determine baseline             OT - Cognition Comments: increased processing time, follows all commands with time                 Following commands: Intact  Cueing  General Comments          Exercises     Shoulder Instructions      Home Living Family/patient expects to be discharged to:: Skilled nursing facility                                 Additional Comments: Spouse unable to assist. Currently in SNF for own medical problems.      Prior Functioning/Environment Prior Level of Function : Independent/Modified Independent;Driving             Mobility Comments: mod-I with rollator. drives. ADLs Comments: Sponge bathes at baseline.    OT Problem List: Decreased strength;Decreased activity tolerance;Decreased range of motion;Impaired balance (sitting and/or standing)   OT Treatment/Interventions: Self-care/ADL training;Therapeutic  exercise;Energy conservation;DME and/or AE instruction;Therapeutic activities      OT Goals(Current goals can be found in the care plan section)   Acute Rehab OT Goals Patient Stated Goal: to go to rehab OT Goal Formulation: With patient Time For Goal Achievement: 10/28/23 Potential to Achieve Goals: Good ADL Goals Pt Will Perform Grooming: with modified independence;standing Pt Will Perform Lower Body Dressing: with modified independence;sit to/from stand Pt Will Transfer to Toilet: with modified independence;ambulating;regular height toilet   OT Frequency:  Min 2X/week    Co-evaluation              AM-PAC OT "6 Clicks" Daily Activity     Outcome Measure Help from another person eating meals?: None Help from another person taking care of personal grooming?: A Little Help from another person toileting, which includes using toliet, bedpan, or urinal?: A Lot Help from another person bathing (including washing, rinsing, drying)?: A Lot Help from another person to put on and taking off regular upper body clothing?: A Little Help from another person to put on and taking off regular lower body clothing?: A Lot 6 Click Score: 16   End of Session Equipment Utilized During Treatment: Rolling walker (2 wheels) Nurse Communication: Mobility status  Activity Tolerance: Patient tolerated treatment well Patient left: in chair;with call bell/phone within reach  OT Visit Diagnosis: Unsteadiness on feet (R26.81)                Time: 1610-9604 OT Time Calculation (min): 16 min Charges:  OT General Charges $OT Visit: 1 Visit OT Evaluation $OT Eval Low Complexity: 1 Low  Kristin Mitchell, M.S. OTR/L  10/14/23, 9:50 AM  ascom [phone]

## 2023-10-15 ENCOUNTER — Encounter: Payer: Self-pay | Admitting: Orthopedic Surgery

## 2023-10-15 DIAGNOSIS — I447 Left bundle-branch block, unspecified: Secondary | ICD-10-CM | POA: Diagnosis present

## 2023-10-15 DIAGNOSIS — Z79899 Other long term (current) drug therapy: Secondary | ICD-10-CM | POA: Diagnosis not present

## 2023-10-15 DIAGNOSIS — I5042 Chronic combined systolic (congestive) and diastolic (congestive) heart failure: Secondary | ICD-10-CM | POA: Diagnosis present

## 2023-10-15 DIAGNOSIS — Z880 Allergy status to penicillin: Secondary | ICD-10-CM | POA: Diagnosis not present

## 2023-10-15 DIAGNOSIS — I5022 Chronic systolic (congestive) heart failure: Secondary | ICD-10-CM | POA: Diagnosis present

## 2023-10-15 DIAGNOSIS — I700 Atherosclerosis of aorta: Secondary | ICD-10-CM | POA: Diagnosis present

## 2023-10-15 DIAGNOSIS — Z7901 Long term (current) use of anticoagulants: Secondary | ICD-10-CM | POA: Diagnosis not present

## 2023-10-15 DIAGNOSIS — M810 Age-related osteoporosis without current pathological fracture: Secondary | ICD-10-CM | POA: Diagnosis present

## 2023-10-15 DIAGNOSIS — I341 Nonrheumatic mitral (valve) prolapse: Secondary | ICD-10-CM | POA: Diagnosis present

## 2023-10-15 DIAGNOSIS — N1831 Chronic kidney disease, stage 3a: Secondary | ICD-10-CM | POA: Diagnosis present

## 2023-10-15 DIAGNOSIS — Z9013 Acquired absence of bilateral breasts and nipples: Secondary | ICD-10-CM | POA: Diagnosis not present

## 2023-10-15 DIAGNOSIS — I130 Hypertensive heart and chronic kidney disease with heart failure and stage 1 through stage 4 chronic kidney disease, or unspecified chronic kidney disease: Secondary | ICD-10-CM | POA: Diagnosis present

## 2023-10-15 DIAGNOSIS — M5030 Other cervical disc degeneration, unspecified cervical region: Secondary | ICD-10-CM | POA: Diagnosis present

## 2023-10-15 DIAGNOSIS — M48062 Spinal stenosis, lumbar region with neurogenic claudication: Secondary | ICD-10-CM | POA: Diagnosis present

## 2023-10-15 DIAGNOSIS — Z7983 Long term (current) use of bisphosphonates: Secondary | ICD-10-CM | POA: Diagnosis not present

## 2023-10-15 DIAGNOSIS — Z853 Personal history of malignant neoplasm of breast: Secondary | ICD-10-CM | POA: Diagnosis not present

## 2023-10-15 DIAGNOSIS — E785 Hyperlipidemia, unspecified: Secondary | ICD-10-CM | POA: Diagnosis present

## 2023-10-15 DIAGNOSIS — N3281 Overactive bladder: Secondary | ICD-10-CM | POA: Diagnosis present

## 2023-10-15 DIAGNOSIS — Z8042 Family history of malignant neoplasm of prostate: Secondary | ICD-10-CM | POA: Diagnosis not present

## 2023-10-15 DIAGNOSIS — M170 Bilateral primary osteoarthritis of knee: Secondary | ICD-10-CM | POA: Diagnosis present

## 2023-10-15 DIAGNOSIS — I739 Peripheral vascular disease, unspecified: Secondary | ICD-10-CM | POA: Diagnosis present

## 2023-10-15 DIAGNOSIS — Z807 Family history of other malignant neoplasms of lymphoid, hematopoietic and related tissues: Secondary | ICD-10-CM | POA: Diagnosis not present

## 2023-10-15 DIAGNOSIS — Z9049 Acquired absence of other specified parts of digestive tract: Secondary | ICD-10-CM | POA: Diagnosis not present

## 2023-10-15 DIAGNOSIS — I4819 Other persistent atrial fibrillation: Secondary | ICD-10-CM | POA: Diagnosis present

## 2023-10-15 DIAGNOSIS — Z8673 Personal history of transient ischemic attack (TIA), and cerebral infarction without residual deficits: Secondary | ICD-10-CM | POA: Diagnosis not present

## 2023-10-15 LAB — PROTIME-INR
INR: 3.2 — ABNORMAL HIGH (ref 0.8–1.2)
Prothrombin Time: 33.2 s — ABNORMAL HIGH (ref 11.4–15.2)

## 2023-10-15 MED ORDER — WARFARIN SODIUM 1 MG PO TABS
1.0000 mg | ORAL_TABLET | Freq: Once | ORAL | Status: AC
  Administered 2023-10-15: 1 mg via ORAL
  Filled 2023-10-15 (×2): qty 1

## 2023-10-15 NOTE — Anesthesia Postprocedure Evaluation (Signed)
 Anesthesia Post Note  Patient: Kristin Mitchell  Procedure(s) Performed: ARTHROPLASTY, KNEE, TOTAL, USING IMAGELESS COMPUTER-ASSISTED NAVIGATION (Left: Knee)  Patient location during evaluation: PACU Anesthesia Type: General Level of consciousness: awake and alert Pain management: pain level controlled Vital Signs Assessment: post-procedure vital signs reviewed and stable Respiratory status: spontaneous breathing, nonlabored ventilation, respiratory function stable and patient connected to nasal cannula oxygen Cardiovascular status: blood pressure returned to baseline and stable Postop Assessment: no apparent nausea or vomiting Anesthetic complications: no   No notable events documented.   Last Vitals:  Vitals:   10/14/23 1655 10/15/23 0333  BP: (!) 102/51 (!) 147/53  Pulse: (!) 57 (!) 56  Resp: 15   Temp: (!) 36.4 C 36.4 C  SpO2: 98% 98%    Last Pain:  Vitals:   10/15/23 0333  TempSrc:   PainSc: 0-No pain                 Nancey Awkward

## 2023-10-15 NOTE — Consult Note (Signed)
 Pharmacy Consult Note - Anticoagulation  Pharmacy Consult for warfarin Indication: atrial fibrillation  PATIENT MEASUREMENTS: Height: 5\' 2"  (157.5 cm) Weight: 62.1 kg (137 lb) IBW/kg (Calculated) : 50.1 HEPARIN DW (KG): 62.1  VITAL SIGNS: Temp: 97.6 F (36.4 C) (06/06 0333) BP: 147/53 (06/06 0333) Pulse Rate: 56 (06/06 0333)  Recent Labs    10/14/23 0605 10/15/23 0633  HGB 10.3*  --   HCT 32.1*  --   PLT 154  --   LABPROT 23.9* 33.2*  INR 2.1* 3.2*    Estimated Creatinine Clearance: 34.2 mL/min (A) (by C-G formula based on SCr of 1.1 mg/dL (H)).  PAST MEDICAL HISTORY: Past Medical History:  Diagnosis Date   Anemia    Aortic atherosclerosis (HCC)    Arthritis    Breast cancer, right (HCC) 11/26/2014   a.) s/p BILATERAL mastectomies   Cerebral microvascular disease    CKD (chronic kidney disease), stage III (HCC)    DDD (degenerative disc disease), cervical    HFrEF (heart failure with reduced ejection fraction) (HCC)    HLD (hyperlipidemia)    HTN (hypertension)    Intermittent palpitations    LBBB (left bundle branch block)    Long term current use of amiodarone    Lumbar stenosis with neurogenic claudication    MVP (mitral valve prolapse)    OAB (overactive bladder)    On warfarin for atrial fibrillation (HCC)    Osteoporosis    Persistent atrial fibrillation (HCC)    a.) CHA2DS2VASc = 6 (age x2, sex, HFrEF, HTN, vascular disease) as of 10/12/2023; b.) rate/rhythm maintained on oral amiodarone + metoprolol ; chronically anticoagulated with warfarin   Seasonal allergies    Senile cataract    Stroke [facility])    a.) noted on brain MRI performed on 03/28/2022: chronic RIGHT parietal lobe infarcts   Valvular insufficiency     ASSESSMENT: 83 y.o. female with PMH including osteoarthritis, atrial fibrillation is presenting for TKA. Patient is on chronic anticoagulation with warfarin, which has been on hold in anticipation of surgery - last dose was 5/30. Current INR  is subtherapeutic. CHA2DS2VASc is at least 7 (HTN, age+2, TIA, PVD, female sex). Pharmacy has been consulted to manage warfarin.  Pertinent medications: Warfarin 2mg  PO daily on Tuesdays and Wednesdays Warfarin 1mg  PO daily Monday, Thursday, Friday, Saturday, Sunday Total weekly dose = 9mg   New drug-drug interactions: Celecoxib - increased chance of bleeding  Goal(s) of therapy: INR 2 - 3 Monitor platelets by anticoagulation protocol: Yes   Baseline anticoagulation labs: Recent Labs    10/13/23 0742 10/14/23 0605 10/15/23 0633  INR 1.8* 2.1* 3.2*  HGB  --  10.3*  --   PLT  --  154  --      PLAN: INR is slightly supratherapeutic this morning at 3.2  Although patient does not appear to be in an acute exacerbation, a history of HFrEF (EF 35-40% 02/2022) may increase warfarin sensitivity and INR response; inpatient diet may also be playing a role in current INR trend Per chart review, no signs/symptoms of bleeding noted Give warfarin 1 mg x1 today (continuing home dose) Monitor INR daily while inpatient Monitor signs/symptoms of bleeding CBC at least every 7 days  Thank you for involving pharmacy in this patient's care.   Ananias Balls, PharmD Clinical Pharmacist 10/15/2023 7:29 AM

## 2023-10-15 NOTE — Progress Notes (Signed)
 Subjective: 2 Days Post-Op Procedure(s) (LRB): ARTHROPLASTY, KNEE, TOTAL, USING IMAGELESS COMPUTER-ASSISTED NAVIGATION (Left) Patient reports pain as mild and moderate.   Patient seen in rounds with Dr. Aubry Blase. Patient is well, and has had no acute complaints or problems Denies any CP, SOB, N/V, fevers or chills We will continue with therapy today.  Plan is to go Skilled nursing facility after hospital stay.  Objective: Vital signs in last 24 hours: Temp:  [97.5 F (36.4 C)-97.6 F (36.4 C)] 97.6 F (36.4 C) (06/06 0333) Pulse Rate:  [56-57] 56 (06/06 0333) Resp:  [15] 15 (06/05 1655) BP: (102-147)/(51-53) 147/53 (06/06 0333) SpO2:  [98 %] 98 % (06/06 0333)  Intake/Output from previous day:  Intake/Output Summary (Last 24 hours) at 10/15/2023 0814 Last data filed at 10/14/2023 1900 Gross per 24 hour  Intake 240 ml  Output --  Net 240 ml    Intake/Output this shift: No intake/output data recorded.  Labs: Recent Labs    10/14/23 0605  HGB 10.3*   Recent Labs    10/14/23 0605  WBC 13.8*  RBC 3.32*  HCT 32.1*  PLT 154   No results for input(s): "NA", "K", "CL", "CO2", "BUN", "CREATININE", "GLUCOSE", "CALCIUM " in the last 72 hours. Recent Labs    10/14/23 0605 10/15/23 0633  INR 2.1* 3.2*    EXAM General - Patient is Alert, Appropriate, and Oriented Extremity - Neurologically intact Neurovascular intact Sensation intact distally Intact pulses distally Dorsiflexion/Plantar flexion intact No cellulitis present Compartment soft Dressing - dressing C/D/I and no drainage Motor Function - intact, moving foot and toes well on exam.  JP Drain removed without difficulty, intact Aquacell noted to have moderate drainage. Removed, incision cleaned with alcohol and new honeycomb applied  Past Medical History:  Diagnosis Date   Anemia    Aortic atherosclerosis (HCC)    Arthritis    Breast cancer, right (HCC) 11/26/2014   a.) s/p BILATERAL mastectomies   Cerebral  microvascular disease    CKD (chronic kidney disease), stage III (HCC)    DDD (degenerative disc disease), cervical    HFrEF (heart failure with reduced ejection fraction) (HCC)    HLD (hyperlipidemia)    HTN (hypertension)    Intermittent palpitations    LBBB (left bundle branch block)    Long term current use of amiodarone    Lumbar stenosis with neurogenic claudication    MVP (mitral valve prolapse)    OAB (overactive bladder)    On warfarin for atrial fibrillation (HCC)    Osteoporosis    Persistent atrial fibrillation (HCC)    a.) CHA2DS2VASc = 6 (age x2, sex, HFrEF, HTN, vascular disease) as of 10/12/2023; b.) rate/rhythm maintained on oral amiodarone + metoprolol ; chronically anticoagulated with warfarin   Seasonal allergies    Senile cataract    Stroke [facility])    a.) noted on brain MRI performed on 03/28/2022: chronic RIGHT parietal lobe infarcts   Valvular insufficiency     Assessment/Plan: 2 Days Post-Op Procedure(s) (LRB): ARTHROPLASTY, KNEE, TOTAL, USING IMAGELESS COMPUTER-ASSISTED NAVIGATION (Left) Principal Problem:   History of total knee arthroplasty, left  Estimated body mass index is 25.06 kg/m as calculated from the following:   Height as of this encounter: 5\' 2"  (1.575 m).   Weight as of this encounter: 62.1 kg. 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 Patient will continue to work with physical therapy to pass postoperative PT protocols, ROM and strengthening.  Patient's husband at home was recently discharged from hospital, and is unable  to help with care of patient postoperatively.  Given the patient picture and limitations, find it best that she remain in the hospital, potential inpatient admission with SNF placement/workup.  Discussed with the patient continuing to utilize Polar Care  Patient will use bone foam in 20-30 minute intervals  Patient will wear TED hose bilaterally to help prevent DVT and clot formation  A new aquacell bandage was applied. Discussed the Aquacel bandage.  This bandage will stay in place 7 days postoperatively.  Can be replaced with honeycomb bandages that will be sent home with the patient  Discussed sending the patient home with tramadol and oxycodone  for as needed pain management.  Patient will also be sent home with Celebrex to help with swelling and inflammation.  Patient will take at home warfarin for DVT prophylaxis  JP drain removed without difficulty, intact  Weight-Bearing as tolerated to left leg  Patient will follow-up with [facility] clinic orthopedics in 2 weeks for staple removal and reevaluation  Wadie Guile, PA-C [facility] 10/15/2023, 8:14 AM

## 2023-10-15 NOTE — Progress Notes (Signed)
 ORTHOPAEDICS: PT notes reviewed. Patient is not safe to discharge to home.  Patient's status changed to "Inpatient" earlier today. Will continue with PT over the weekend. Anticipate discharge to [facility] on Monday.  I spoke with representative at [facility] and they have reserved a room for the patient for Monday.  Saron Tweed P. Zarinah Oviatt, Jr. M.D.

## 2023-10-15 NOTE — Progress Notes (Addendum)
 Physical Therapy Treatment Patient Details Name: Kristin Mitchell MRN: [id number] DOB: Nov 21, 1940 Today's Date: 10/15/2023   History of Present Illness Pt is a pleasant 83 y.o. female s/p L TKA on 10/13/23.    PT Comments  Pt was long sitting in bed, awake, and oriented x 2 at first however improved orientation throughout session. She is remains motivated and cooperative throughout session. Pt endorses no pain at rest that only elevated to 3/10 in wt bearing. Author reviewed positioning, polar care use, and importance of OOB activity. During session, pt required extensive assistance to safely exit bed, stand to RW, and tolerate ambulation short distance to [facility]. Successfully urinated on BSC prior to standing and returning to bed. Elected not to have pt sit in recliner 2/2 to pt being moved to ned room in hospital. Pt does demonstrate AROM while seated 2-86 degrees. Frequency adjusted to maximize skilled PT in hopes of assisting pt to PLOF quicker. Overall, pt remains far from baseline abilities and will benefit from the continued skilled PT to maximize safety and independence with all ADLs.    If plan is discharge home, recommend the following: A lot of help with walking and/or transfers;A lot of help with bathing/dressing/bathroom;Assistance with cooking/housework;Direct supervision/assist for medications management;Direct supervision/assist for financial management;Assist for transportation;Help with stairs or ramp for entrance;Supervision due to cognitive status     Equipment Recommendations  Rolling walker (2 wheels) (Pt endorses having RW already however will need to be confirmed with pt's brother/sister in-law)       Precautions / Restrictions Precautions Precautions: Fall;Knee Precaution Booklet Issued: Yes (comment) Recall of Precautions/Restrictions: Impaired Restrictions Weight Bearing Restrictions Per Provider Order: Yes LLE Weight Bearing Per Provider Order: Weight bearing as  tolerated     Mobility  Bed Mobility Overal bed mobility: Needs Assistance Bed Mobility: Supine to Sit, Sit to Supine  Supine to sit: Mod assist, HOB elevated, Used rails Sit to supine: Max assist General bed mobility comments: pt does continue to require extensive assistance to safely exit bed and then later to return to bed form EOB short sitting. vcs throughout + increased time  required    Transfers Overall transfer level: Needs assistance Equipment used: Rolling walker (2 wheels) Transfers: Sit to/from Stand Sit to Stand: Contact guard assist, Min assist, From elevated surface  General transfer comment: CGA from elevated surfaces, min assist from lower    Ambulation/Gait Ambulation/Gait assistance: Contact guard assist, Min assist Gait Distance (Feet): 5 Feet Assistive device: Rolling walker (2 wheels) Gait Pattern/deviations: Step-to pattern, Antalgic Gait velocity: very slow  General Gait Details: Pt was able to ambulate 2 x ~ 5 ft. Author will return later this date and progress gait distances once moved to floor. Will increase frequency in hopes top progress pt to PLOF.    Balance Overall balance assessment: Needs assistance Sitting-balance support: Feet supported, No upper extremity supported Sitting balance-Leahy Scale: Fair     Standing balance support: Bilateral upper extremity supported Standing balance-Leahy Scale: Fair       Hotel manager: No apparent difficulties  Cognition Arousal: Alert Behavior During Therapy: Flat affect, WFL for tasks assessed/performed   PT - Cognitive impairments: Orientation, Awareness      PT - Cognition Comments: Pt presents with flat affect but is alert and extremely cooperative. At first reports she in in home health however with a few cues was able to correct to reporting in hospital and correct reason for being hospitalized. pt does have slow response time and  requires increased time for all  desired task of her. Following commands: Intact      Cueing Cueing Techniques: Verbal cues, Tactile cues  Exercises Total Joint Exercises Ankle Circles/Pumps: 20 reps Knee Flexion: AROM, Seated Goniometric ROM: ~2-86        Pertinent Vitals/Pain Pain Assessment Pain Assessment: 0-10 Pain Score: 3  Pain Location: L knee Pain Descriptors / Indicators: Aching, Discomfort Pain Intervention(s): Limited activity within patient's tolerance, Monitored during session, Premedicated before session, Repositioned, Ice applied     PT Goals (current goals can now be found in the care plan section) Acute Rehab PT Goals Patient Stated Goal: none stated Progress towards PT goals: Progressing toward goals    Frequency    7X/week       AM-PAC PT "6 Clicks" Mobility   Outcome Measure  Help needed turning from your back to your side while in a flat bed without using bedrails?: A Lot Help needed moving from lying on your back to sitting on the side of a flat bed without using bedrails?: A Lot Help needed moving to and from a bed to a chair (including a wheelchair)?: A Lot Help needed standing up from a chair using your arms (e.g., wheelchair or bedside chair)?: A Little Help needed to walk in hospital room?: A Lot Help needed climbing 3-5 steps with a railing? : Total 6 Click Score: 12    End of Session   Activity Tolerance: Patient tolerated treatment well;Patient limited by fatigue;Patient limited by lethargy Patient left: in bed;with call bell/phone within reach;with bed alarm set (will assist pt with more OOB activity in PM session) Nurse Communication: Mobility status PT Visit Diagnosis: Muscle weakness (generalized) (M62.81);Difficulty in walking, not elsewhere classified (R26.2);Pain Pain - Right/Left: Left Pain - part of body: Knee     Time: 0820-0843 PT Time Calculation (min) (ACUTE ONLY): 23 min  Charges:    $Therapeutic Activity: 23-37 mins PT General Charges $$  ACUTE PT VISIT: 1 Visit                     Chester Costa PTA 10/15/23, 9:04 AM

## 2023-10-15 NOTE — Plan of Care (Signed)
 Documented

## 2023-10-15 NOTE — TOC Progression Note (Signed)
 Transition of Care [facility]) - Progression Note    Patient Details  Name: Ebonye Reade MRN: [id number] Date of Birth: 04-30-1941  Transition of Care [facility]) CM/SW Contact  Alexandra Ice, RN Phone Number: 10/15/2023, 9:05 AM  Clinical Narrative:    TOC confirmed with Willard Harman at [facility], they are able to accept patient today. Sent message to Dr. Aubry Blase, [facility] and [facility] manager notifying patient can discharge today to facility and she will be private pay. Patient is aware.  Dr. Aubry Blase replied he is speaking with [facility] to change to inpatient. Patient will now remain here over the weekend for her 2-midnights to qualify for her skilled stay to [facility]. Notified Ricky at [facility] patient to remain at hospital over weekend and to discharge on Monday.    Expected Discharge Plan: Skilled Nursing Facility    Expected Discharge Plan and Services   Discharge Planning Services: CM Consult Post Acute Care Choice: Skilled Nursing Facility Living arrangements for the past 2 months: Single Family Home                   DME Agency: NA       HH Arranged: NA           Social Determinants of Health (SDOH) Interventions SDOH Screenings   Food Insecurity: No Food Insecurity (10/13/2023)  Housing: Low Risk  (10/13/2023)  Transportation Needs: No Transportation Needs (10/13/2023)  Utilities: Not At Risk (10/13/2023)  Financial Resource Strain: Low Risk  (04/20/2023)   Received from [facility]  Social Connections: Socially Integrated (10/13/2023)  Tobacco Use: Low Risk  (10/13/2023)    Readmission Risk Interventions     No data to display

## 2023-10-15 NOTE — Progress Notes (Signed)
 Physical Therapy Treatment Patient Details Name: Kristin Mitchell MRN: [id number] DOB: 25-Oct-1940 Today's Date: 10/15/2023   History of Present Illness Pt is a pleasant 83 y.o. female s/p L TKA on 10/13/23.    PT Comments  Pt was long sitting in bed upon arrival. She was asleep but easily awakes. Reviewed importance of exercises and positioning. Pt agreeable to session and OOB activity. Continues to present with lethargy however does follow simple commands. Less assistance required to exit bed and stand this afternoon versus AM session. She tolerated ambulation to doorway of room however extremely slow antalgic step to gait with constant vcs for increased R lateral wt shift during LLE advancement. Pt does fatigue quickly and requires chair follow for additional safety. Pt requested to return to bed post session. Polar acre reapplied with operative LE placed on towel roll. DC recs remain appropriate to maximize independence and safety with all ADLKs.    If plan is discharge home, recommend the following: A lot of help with walking and/or transfers;A lot of help with bathing/dressing/bathroom;Assistance with cooking/housework;Direct supervision/assist for medications management;Direct supervision/assist for financial management;Assist for transportation;Help with stairs or ramp for entrance;Supervision due to cognitive status     Equipment Recommendations  Rolling walker (2 wheels)       Precautions / Restrictions Precautions Precautions: Fall;Knee Precaution Booklet Issued: Yes (comment) Recall of Precautions/Restrictions: Impaired Precaution/Restrictions Comments: needs reminders and re-education for knee positioning Restrictions Weight Bearing Restrictions Per Provider Order: Yes LLE Weight Bearing Per Provider Order: Weight bearing as tolerated     Mobility  Bed Mobility Overal bed mobility: Needs Assistance Bed Mobility: Supine to Sit, Sit to Supine  Supine to sit: Mod assist, HOB  elevated, Used rails Sit to supine: Max assist General bed mobility comments: pt does continue to require extensive assistance to safely exit bed and then later to return to bed form EOB short sitting. vcs throughout + increased time  required    Transfers Overall transfer level: Needs assistance Equipment used: Rolling walker (2 wheels) Transfers: Sit to/from Stand Sit to Stand: Contact guard assist  General transfer comment: Pt was able to stand from elevated EOB surface with CGA only. still requires min assist to stand from recliner    Ambulation/Gait Ambulation/Gait assistance: Contact guard assist, Min assist Gait Distance (Feet): 12 Feet Assistive device: Rolling walker (2 wheels) Gait Pattern/deviations: Step-to pattern, Antalgic Gait velocity: very slow  General Gait Details: pt has extremely slow cadence with vcs for increased R lateral wt shift to allow opposite LE progression.    Balance Overall balance assessment: Needs assistance Sitting-balance support: Feet supported, No upper extremity supported Sitting balance-Leahy Scale: Fair     Standing balance support: Bilateral upper extremity supported Standing balance-Leahy Scale: Fair       Hotel manager: No apparent difficulties  Cognition Arousal: Alert Behavior During Therapy: Flat affect   PT - Cognitive impairments: Awareness   Orientation impairments: Situation, Time      PT - Cognition Comments: Pt presents with flat affect but is alert and extremely cooperative. is aware she had knee surgery however does have several occasions of disorientation. Following commands: Intact      Cueing Cueing Techniques: Verbal cues, Tactile cues  Exercises Total Joint Exercises Ankle Circles/Pumps: 20 reps Knee Flexion: AROM, Seated Goniometric ROM: ~2-86        Pertinent Vitals/Pain Pain Assessment Pain Assessment: 0-10 Pain Score: 6  Pain Location: L knee Pain Descriptors /  Indicators: Aching, Discomfort Pain Intervention(s): Limited  activity within patient's tolerance, Monitored during session, Premedicated before session, Repositioned, Ice applied     PT Goals (current goals can now be found in the care plan section) Acute Rehab PT Goals Patient Stated Goal: none stated Progress towards PT goals: Progressing toward goals    Frequency    BID       AM-PAC PT "6 Clicks" Mobility   Outcome Measure  Help needed turning from your back to your side while in a flat bed without using bedrails?: A Lot Help needed moving from lying on your back to sitting on the side of a flat bed without using bedrails?: A Lot Help needed moving to and from a bed to a chair (including a wheelchair)?: A Lot Help needed standing up from a chair using your arms (e.g., wheelchair or bedside chair)?: A Lot Help needed to walk in hospital room?: A Lot Help needed climbing 3-5 steps with a railing? : Total 6 Click Score: 11    End of Session   Activity Tolerance: Patient tolerated treatment well;Patient limited by pain;Patient limited by lethargy Patient left: in bed;with call bell/phone within reach;with bed alarm set Nurse Communication: Mobility status PT Visit Diagnosis: Muscle weakness (generalized) (M62.81);Difficulty in walking, not elsewhere classified (R26.2);Pain Pain - Right/Left: Left Pain - part of body: Knee     Time: 1306-1330 PT Time Calculation (min) (ACUTE ONLY): 24 min  Charges:    $Gait Training: 8-22 mins $Therapeutic Activity: 8-22 mins PT General Charges $$ ACUTE PT VISIT: 1 Visit                    Chester Costa PTA 10/15/23, 2:05 PM

## 2023-10-15 NOTE — TOC Progression Note (Signed)
 Transition of Care [facility]) - Progression Note    Patient Details  Name: Kristin Mitchell MRN: [id number] Date of Birth: October 10, 1940  Transition of Care [facility]) CM/SW Contact  Alexandra Ice, RN Phone Number: 10/15/2023, 8:30 AM  Clinical Narrative:     LATE ENTRY:   Met with patient and family at bedside. Explained that patient is in observation status and that her insurance will not pay for her skilled nursing facility stay. She will have to private pay to go to [facility]. TOC spoke with Willard Harman at [facility] regarding this, he stated they will be able to accept patient, but they will need payment upfront. Patient wants to be with her spouse who is currently at [facility] under hospice care. They verbalized understanding. TOC answered all their questions and concerns.   Expected Discharge Plan: Skilled Nursing Facility    Expected Discharge Plan and Services   Discharge Planning Services: CM Consult Post Acute Care Choice: Skilled Nursing Facility Living arrangements for the past 2 months: Single Family Home                   DME Agency: NA       HH Arranged: NA           Social Determinants of Health (SDOH) Interventions SDOH Screenings   Food Insecurity: No Food Insecurity (10/13/2023)  Housing: Low Risk  (10/13/2023)  Transportation Needs: No Transportation Needs (10/13/2023)  Utilities: Not At Risk (10/13/2023)  Financial Resource Strain: Low Risk  (04/20/2023)   Received from [facility]  Social Connections: Socially Integrated (10/13/2023)  Tobacco Use: Low Risk  (10/13/2023)    Readmission Risk Interventions     No data to display

## 2023-10-15 NOTE — Progress Notes (Signed)
 Occupational Therapy Treatment Patient Details Name: Kristin Mitchell MRN: [id number] DOB: [birth year] Today's Date: 10/15/2023   History of present illness Pt is a pleasant 83 y.o. female s/p L TKA on 10/13/23.   OT comments  Upon entering the room, pt supine in bed and agreeable to OT intervention. Pt reporting urgency for toileting. Plan to ambulate 10' into bathroom. Pt needing mod A for supine >sit. Static sitting balance with supervision. Pt stands with mod A from standard bed height. Pt takes side steps along EOB with increased time and min A. Pt unable to sequence forwards ambulation and stands for several minutes without initiating any movement. Pt returning to seated position to rest. Pt stands again and transfers with mod A stand pivot transfer to [facility]. Pt able to void and performs hygiene while standing and returning to bed in same manner as above. Call bell and all needed items within reach.       If plan is discharge home, recommend the following:  A little help with walking and/or transfers;A lot of help with bathing/dressing/bathroom;Help with stairs or ramp for entrance   Equipment Recommendations  BSC/3in1       Precautions / Restrictions Precautions Precautions: Fall;Knee Restrictions Weight Bearing Restrictions Per Provider Order: Yes LLE Weight Bearing Per Provider Order: Weight bearing as tolerated       Mobility Bed Mobility Overal bed mobility: Needs Assistance Bed Mobility: Supine to Sit, Sit to Supine     Supine to sit: Mod assist, HOB elevated, Used rails Sit to supine: Max assist        Transfers Overall transfer level: Needs assistance Equipment used: Rolling walker (2 wheels) Transfers: Sit to/from Stand, Bed to chair/wheelchair/BSC Sit to Stand: Mod assist Stand pivot transfers: Mod assist               Balance Overall balance assessment: Needs assistance Sitting-balance support: Feet supported, No upper extremity supported Sitting  balance-Leahy Scale: Fair     Standing balance support: Bilateral upper extremity supported Standing balance-Leahy Scale: Fair Standing balance comment: neutral standing balance. Light UE support statically                           ADL either performed or assessed with clinical judgement   ADL Overall ADL's : Needs assistance/impaired                         Toilet Transfer: Moderate assistance   Toileting- Clothing Manipulation and Hygiene: Moderate assistance              Extremity/Trunk Assessment Upper Extremity Assessment Upper Extremity Assessment: Generalized weakness   Lower Extremity Assessment Lower Extremity Assessment: Generalized weakness        Vision Patient Visual Report: No change from baseline           Communication Communication Communication: No apparent difficulties   Cognition Arousal: Alert Behavior During Therapy: Flat affect, WFL for tasks assessed/performed Cognition: No family/caregiver present to determine baseline                               Following commands: Intact        Cueing   Cueing Techniques: Verbal cues, Tactile cues             Pertinent Vitals/ Pain       Pain Assessment Pain Assessment: 0-10 Faces  Pain Scale: Hurts a little bit Pain Location: L knee Pain Descriptors / Indicators: Aching, Discomfort Pain Intervention(s): Limited activity within patient's tolerance, Monitored during session, Premedicated before session, Ice applied, Repositioned         Frequency  Min 2X/week        Progress Toward Goals  OT Goals(current goals can now be found in the care plan section)  Progress towards OT goals: Progressing toward goals      AM-PAC OT "6 Clicks" Daily Activity     Outcome Measure   Help from another person eating meals?: None Help from another person taking care of personal grooming?: A Little Help from another person toileting, which includes using  toliet, bedpan, or urinal?: A Lot Help from another person bathing (including washing, rinsing, drying)?: A Lot Help from another person to put on and taking off regular upper body clothing?: A Little Help from another person to put on and taking off regular lower body clothing?: A Little 6 Click Score: 17    End of Session Equipment Utilized During Treatment: Rolling walker (2 wheels)  OT Visit Diagnosis: Unsteadiness on feet (R26.81)   Activity Tolerance Patient limited by fatigue   Patient Left with call bell/phone within reach;in bed;with bed alarm set   Nurse Communication Mobility status        Time: 8295-6213 OT Time Calculation (min): 22 min  Charges: OT General Charges $OT Visit: 1 Visit OT Treatments $Self Care/Home Management : 8-22 mins  George Kinder, MS, OTR/L , CBIS ascom [phone]  10/15/23, 12:52 PM

## 2023-10-16 LAB — CBC
HCT: 27.7 % — ABNORMAL LOW (ref 36.0–46.0)
Hemoglobin: 8.9 g/dL — ABNORMAL LOW (ref 12.0–15.0)
MCH: 30.3 pg (ref 26.0–34.0)
MCHC: 32.1 g/dL (ref 30.0–36.0)
MCV: 94.2 fL (ref 80.0–100.0)
Platelets: 112 10*3/uL — ABNORMAL LOW (ref 150–400)
RBC: 2.94 MIL/uL — ABNORMAL LOW (ref 3.87–5.11)
RDW: 14.3 % (ref 11.5–15.5)
WBC: 7.5 10*3/uL (ref 4.0–10.5)
nRBC: 0 % (ref 0.0–0.2)

## 2023-10-16 LAB — PROTIME-INR
INR: 3.1 — ABNORMAL HIGH (ref 0.8–1.2)
Prothrombin Time: 32.2 s — ABNORMAL HIGH (ref 11.4–15.2)

## 2023-10-16 NOTE — Progress Notes (Addendum)
 Subjective: 3 Days Post-Op Procedure(s) (LRB): ARTHROPLASTY, KNEE, TOTAL, USING IMAGELESS COMPUTER-ASSISTED NAVIGATION (Left) Patient reports pain as mild and moderate.   Patient seen in rounds with Dr. Aubry Blase. Patient is well, and has had no acute complaints or problems Denies any CP, SOB, N/V, fevers or chills. Did report some dry coughing, encouraged use of incentive spirometer through out the day. We will continue with therapy today.  Plan is to go Skilled nursing facility after hospital stay.  Objective: Vital signs in last 24 hours: Temp:  [97.4 F (36.3 C)-97.8 F (36.6 C)] 97.4 F (36.3 C) (06/07 0835) Pulse Rate:  [61-64] 62 (06/07 0835) Resp:  [16-18] 16 (06/07 0835) BP: (128-162)/(47-54) 162/52 (06/07 0835) SpO2:  [95 %-98 %] 98 % (06/07 0835)  Intake/Output from previous day:  Intake/Output Summary (Last 24 hours) at 10/16/2023 1131 Last data filed at 10/15/2023 1700 Gross per 24 hour  Intake 240 ml  Output --  Net 240 ml    Intake/Output this shift: No intake/output data recorded.  Labs: Recent Labs    10/14/23 0605 10/16/23 0547  HGB 10.3* 8.9*   Recent Labs    10/14/23 0605 10/16/23 0547  WBC 13.8* 7.5  RBC 3.32* 2.94*  HCT 32.1* 27.7*  PLT 154 112*   No results for input(s): "NA", "K", "CL", "CO2", "BUN", "CREATININE", "GLUCOSE", "CALCIUM " in the last 72 hours. Recent Labs    10/15/23 0633 10/16/23 0547  INR 3.2* 3.1*    EXAM General - Patient is Alert, Appropriate, and Oriented Extremity - Neurologically intact Neurovascular intact Sensation intact distally Intact pulses distally Dorsiflexion/Plantar flexion intact No cellulitis present Compartment soft Dressing - dressing C/D/I and no drainage Motor Function - intact, moving foot and toes well on exam.  JP Drain removed without difficulty, intact Aquacell noted to have heavy amount of drainage. Removed, incision cleaned with alcohol and new honeycomb dressing applied. A new modified  Jones dressing was applied and the patient was encouraged to keep her knee flexed with a pillow inserted under her knee.  Past Medical History:  Diagnosis Date   Anemia    Aortic atherosclerosis (HCC)    Arthritis    Breast cancer, right (HCC) 11/26/2014   a.) s/p BILATERAL mastectomies   Cerebral microvascular disease    CKD (chronic kidney disease), stage III (HCC)    DDD (degenerative disc disease), cervical    HFrEF (heart failure with reduced ejection fraction) (HCC)    HLD (hyperlipidemia)    HTN (hypertension)    Intermittent palpitations    LBBB (left bundle branch block)    Long term current use of amiodarone    Lumbar stenosis with neurogenic claudication    MVP (mitral valve prolapse)    OAB (overactive bladder)    On warfarin for atrial fibrillation (HCC)    Osteoporosis    Persistent atrial fibrillation (HCC)    a.) CHA2DS2VASc = 6 (age x2, sex, HFrEF, HTN, vascular disease) as of 10/12/2023; b.) rate/rhythm maintained on oral amiodarone + metoprolol ; chronically anticoagulated with warfarin   Seasonal allergies    Senile cataract    Stroke [facility])    a.) noted on brain MRI performed on 03/28/2022: chronic RIGHT parietal lobe infarcts   Valvular insufficiency     Assessment/Plan: 3 Days Post-Op Procedure(s) (LRB): ARTHROPLASTY, KNEE, TOTAL, USING IMAGELESS COMPUTER-ASSISTED NAVIGATION (Left) Principal Problem:   History of total knee arthroplasty, left  Estimated body mass index is 25.06 kg/m as calculated from the following:   Height as of this  encounter: 5\' 2"  (1.575 m).   Weight as of this encounter: 62.1 kg. Advance diet Up with therapy  Patient is currently being evaluated with pharmacy to monitor her INR and progress her warfarin per cardiology recommendations. Her INR is at 3.1 today Hgb at 8.9 today, continue to evaluate postoperative bleeding with CBC tomorrow AM Patient had a new honeycomb dressing applied, and a modified Jones dressing placed.  Patient was encouraged to keep her knee bent with a pillow underneath. Nursing made aware  Encouraged to utilize incentive spirometer  Discussed with the patient continuing to utilize [employer]  Patient will use bone foam in 20-30 minute intervals  Patient will wear TED hose bilaterally to help prevent DVT and clot formation  Patient will take at home warfarin for DVT prophylaxis  Weight-Bearing as tolerated to left leg  Patient will follow-up with [facility] clinic orthopedics in 2 weeks for staple removal and reevaluation  Wadie Guile, PA-C [facility] 10/16/2023, 11:31 AM

## 2023-10-16 NOTE — Progress Notes (Signed)
 Physical Therapy Treatment Patient Details Name: Kristin Mitchell MRN: [id number] DOB: 03/18/1941 Today's Date: 10/16/2023   History of Present Illness Pt is a pleasant 83 y.o. female s/p L TKA on 10/13/23.    PT Comments  Pt's seems to be very limited by fatigue during this morning PT session. Attempted short ambulation distance with pt to bathroom commode but pt fatigued quickly after several transfers (CGA) and a few steps with RW towards the commode requiring seated rest on BSC and requiring squat pivot (Mod A) back to EOB.  Continued PT will assist pt towards greater standing balance, LE strengthening, and activity tolerance to increase safety and independence and decrease burden of care with functional mobility.    If plan is discharge home, recommend the following: A lot of help with walking and/or transfers;A lot of help with bathing/dressing/bathroom;Assistance with cooking/housework;Direct supervision/assist for medications management;Direct supervision/assist for financial management;Assist for transportation;Help with stairs or ramp for entrance;Supervision due to cognitive status   Can travel by private vehicle     No  Equipment Recommendations  Rolling walker (2 wheels)    Recommendations for Other Services       Precautions / Restrictions Precautions Precautions: Fall;Knee Precaution Booklet Issued: Yes (comment) Recall of Precautions/Restrictions: Impaired Precaution/Restrictions Comments: needs reminders and re-education for knee positioning Restrictions Weight Bearing Restrictions Per Provider Order: Yes LLE Weight Bearing Per Provider Order: Weight bearing as tolerated     Mobility  Bed Mobility Overal bed mobility: Needs Assistance Bed Mobility: Supine to Sit, Sit to Supine     Supine to sit: Supervision, HOB elevated, Used rails Sit to supine: Max assist, +2 for safety/equipment   General bed mobility comments: pt does continue to require extensive  assistance to safely exit bed and then later to return to bed form EOB short sitting. vcs throughout + increased time  required    Transfers Overall transfer level: Needs assistance Equipment used: Rolling walker (2 wheels) Transfers: Sit to/from Stand, Bed to chair/wheelchair/BSC Sit to Stand: Contact guard assist Stand pivot transfers: Min assist   Squat pivot transfers: Mod assist     General transfer comment: Pt fatigued quickly after several transfers and a few steps towards the commode requiring seated rest on commode and attempted step-pivot back to bed but pt unable to continue taking steps and required squat-pivot transfer.    Ambulation/Gait Ambulation/Gait assistance: Contact guard assist, Min assist Gait Distance (Feet): 6 Feet Assistive device: Rolling walker (2 wheels) Gait Pattern/deviations: Step-to pattern, Antalgic Gait velocity: very slow     General Gait Details: pt has extremely slow cadence with vcs for increased R lateral wt shift to allow opposite LE progression. Pt fatigued quickly after several transfers and a few steps towards the commode requiring seated rest on commode   Stairs             Wheelchair Mobility     Tilt Bed    Modified Rankin (Stroke Patients Only)       Balance Overall balance assessment: Needs assistance Sitting-balance support: Feet supported, No upper extremity supported Sitting balance-Leahy Scale: Fair Sitting balance - Comments: When fatigued, pt had more imbalance sitting EOB 2/2 feet sliding on floor.   Standing balance support: Bilateral upper extremity supported Standing balance-Leahy Scale: Fair Standing balance comment: neutral standing balance.  UE support changed from light to heavy 2/2 fatigue.  Communication Communication Communication: No apparent difficulties  Cognition Arousal: Alert Behavior During Therapy: Flat affect   PT - Cognitive impairments:  Awareness                       PT - Cognition Comments: Pt presents with flat affect but is alert and extremely cooperative. is aware she had knee surgery Following commands: Intact      Cueing Cueing Techniques: Verbal cues, Tactile cues  Exercises      General Comments        Pertinent Vitals/Pain Pain Assessment Pain Assessment: No/denies pain    Home Living                          Prior Function            PT Goals (current goals can now be found in the care plan section) Acute Rehab PT Goals Patient Stated Goal: none stated PT Goal Formulation: With patient Time For Goal Achievement: 10/27/23 Potential to Achieve Goals: Good Progress towards PT goals: Progressing toward goals    Frequency    BID      PT Plan      Co-evaluation              AM-PAC PT "6 Clicks" Mobility   Outcome Measure  Help needed turning from your back to your side while in a flat bed without using bedrails?: A Lot Help needed moving from lying on your back to sitting on the side of a flat bed without using bedrails?: A Lot Help needed moving to and from a bed to a chair (including a wheelchair)?: A Lot Help needed standing up from a chair using your arms (e.g., wheelchair or bedside chair)?: A Lot Help needed to walk in hospital room?: A Lot Help needed climbing 3-5 steps with a railing? : Total 6 Click Score: 11    End of Session Equipment Utilized During Treatment: Gait belt;Other (comment) (ice reapplied at the end of session.) Activity Tolerance: Patient limited by lethargy Patient left: in bed;with call bell/phone within reach Nurse Communication: Mobility status PT Visit Diagnosis: Muscle weakness (generalized) (M62.81);Difficulty in walking, not elsewhere classified (R26.2);Pain Pain - Right/Left: Left Pain - part of body: Knee     Time: 1610-9604 PT Time Calculation (min) (ACUTE ONLY): 41 min  Charges:    $Therapeutic Activity: 38-52  mins PT General Charges $$ ACUTE PT VISIT: 1 Visit                     Eliazar Gross, PTA  10/16/23, 10:00 AM

## 2023-10-16 NOTE — Progress Notes (Signed)
 Physical Therapy Treatment Patient Details Name: Kristin Mitchell MRN: [id number] DOB: Jul 10, 1940 Today's Date: 10/16/2023   History of Present Illness Pt is a pleasant 83 y.o. female s/p L TKA on 10/13/23.    PT Comments  Progressed pt with transfer to recliner for upright sitting.  Transferred with RW using step to pattern from bed> recliner; limited distance 2/2 increase time needed for weight shifts to step and fatigue. Continued PT will assist pt towards dynamic standing balance, LE strengthening, and activity tolerance to increase safety and independence and decrease burden of care with functional mobility.     If plan is discharge home, recommend the following: A lot of help with walking and/or transfers;A lot of help with bathing/dressing/bathroom;Assistance with cooking/housework;Direct supervision/assist for medications management;Direct supervision/assist for financial management;Assist for transportation;Help with stairs or ramp for entrance;Supervision due to cognitive status   Can travel by private vehicle     No  Equipment Recommendations  Rolling walker (2 wheels)    Recommendations for Other Services       Precautions / Restrictions Precautions Precautions: Fall;Knee Precaution Booklet Issued: Yes (comment) Recall of Precautions/Restrictions: Impaired Precaution/Restrictions Comments: needs reminders and re-education for knee positioning Restrictions Weight Bearing Restrictions Per Provider Order: Yes LLE Weight Bearing Per Provider Order: Weight bearing as tolerated     Mobility  Bed Mobility Overal bed mobility: Needs Assistance Bed Mobility: Supine to Sit     Supine to sit: Mod assist Sit to supine: Max assist, +2 for safety/equipment   General bed mobility comments: increased time and cues for supine to sit on left side, EOB    Transfers Overall transfer level: Needs assistance Equipment used: Rolling walker (2 wheels) Transfers: Sit to/from Stand,  Bed to chair/wheelchair/BSC Sit to Stand: Min assist Stand pivot transfers: Min assist Step pivot transfers: Min assist Squat pivot transfers: Mod assist     General transfer comment: increase time to for weight shifts to step with RW, cues for sequencing.    Ambulation/Gait Ambulation/Gait assistance: Min assist Gait Distance (Feet): 3 Feet Assistive device: Rolling walker (2 wheels) Gait Pattern/deviations: Step-to pattern, Antalgic Gait velocity: very slow     General Gait Details: step to pattern from bed> recliner; limited distance 2/2 increase time to for weight shifts to step with RW and fatigue.   Stairs             Wheelchair Mobility     Tilt Bed    Modified Rankin (Stroke Patients Only)       Balance Overall balance assessment: Needs assistance Sitting-balance support: Feet supported, No upper extremity supported Sitting balance-Leahy Scale: Fair Sitting balance - Comments: When fatigued, pt had more imbalance sitting EOB 2/2 feet sliding on floor.   Standing balance support: Bilateral upper extremity supported Standing balance-Leahy Scale: Fair Standing balance comment: neutral standing balance.  UE support changed from light to heavy 2/2 fatigue.                            Communication Communication Communication: No apparent difficulties  Cognition Arousal: Alert Behavior During Therapy: WFL for tasks assessed/performed   PT - Cognitive impairments: Problem solving                       PT - Cognition Comments: requires cues for sequencing during mobility and managing RW Following commands: Intact      Cueing Cueing Techniques: Verbal cues, Tactile cues  Exercises Total  Joint Exercises Ankle Circles/Pumps: AROM, Strengthening, Both, 5 reps Quad Sets: AROM, Strengthening, Both, 5 reps    General Comments        Pertinent Vitals/Pain Pain Assessment Pain Assessment: No/denies pain    Home Living                           Prior Function            PT Goals (current goals can now be found in the care plan section) Acute Rehab PT Goals Patient Stated Goal: none stated PT Goal Formulation: With patient Time For Goal Achievement: 10/27/23 Potential to Achieve Goals: Good Progress towards PT goals: Progressing toward goals    Frequency    BID      PT Plan      Co-evaluation              AM-PAC PT "6 Clicks" Mobility   Outcome Measure  Help needed turning from your back to your side while in a flat bed without using bedrails?: A Lot Help needed moving from lying on your back to sitting on the side of a flat bed without using bedrails?: A Lot Help needed moving to and from a bed to a chair (including a wheelchair)?: A Lot Help needed standing up from a chair using your arms (e.g., wheelchair or bedside chair)?: A Lot   Help needed climbing 3-5 steps with a railing? : Total 6 Click Score: 9    End of Session Equipment Utilized During Treatment: Gait belt Activity Tolerance: Patient limited by lethargy Patient left: in chair;with chair alarm set;in CPM Nurse Communication: Mobility status PT Visit Diagnosis: Muscle weakness (generalized) (M62.81);Difficulty in walking, not elsewhere classified (R26.2);Pain Pain - Right/Left: Left Pain - part of body: Knee     Time: 1425-1448 PT Time Calculation (min) (ACUTE ONLY): 23 min  Charges:    $Therapeutic Activity: 23-37 mins PT General Charges $$ ACUTE PT VISIT: 1 Visit                     Eliazar Gross, PTA  10/16/23, 3:07 PM

## 2023-10-16 NOTE — Consult Note (Signed)
 Pharmacy Consult Note - Anticoagulation  Pharmacy Consult for warfarin Indication: atrial fibrillation  PATIENT MEASUREMENTS: Height: 5\' 2"  (157.5 cm) Weight: 62.1 kg (137 lb) IBW/kg (Calculated) : 50.1 HEPARIN DW (KG): 62.1  VITAL SIGNS: Temp: 97.4 F (36.3 C) (06/07 0835) Temp Source: Oral (06/07 0835) BP: 162/52 (06/07 0835) Pulse Rate: 62 (06/07 0835)  Recent Labs    10/16/23 0547  HGB 8.9*  HCT 27.7*  PLT 112*  LABPROT 32.2*  INR 3.1*    Estimated Creatinine Clearance: 34.2 mL/min (A) (by C-G formula based on SCr of 1.1 mg/dL (H)).  PAST MEDICAL HISTORY: Past Medical History:  Diagnosis Date   Anemia    Aortic atherosclerosis (HCC)    Arthritis    Breast cancer, right (HCC) 11/26/2014   a.) s/p BILATERAL mastectomies   Cerebral microvascular disease    CKD (chronic kidney disease), stage III (HCC)    DDD (degenerative disc disease), cervical    HFrEF (heart failure with reduced ejection fraction) (HCC)    HLD (hyperlipidemia)    HTN (hypertension)    Intermittent palpitations    LBBB (left bundle branch block)    Long term current use of amiodarone    Lumbar stenosis with neurogenic claudication    MVP (mitral valve prolapse)    OAB (overactive bladder)    On warfarin for atrial fibrillation (HCC)    Osteoporosis    Persistent atrial fibrillation (HCC)    a.) CHA2DS2VASc = 6 (age x2, sex, HFrEF, HTN, vascular disease) as of 10/12/2023; b.) rate/rhythm maintained on oral amiodarone + metoprolol ; chronically anticoagulated with warfarin   Seasonal allergies    Senile cataract    Stroke [facility])    a.) noted on brain MRI performed on 03/28/2022: chronic RIGHT parietal lobe infarcts   Valvular insufficiency     ASSESSMENT: 83 y.o. female with PMH including osteoarthritis, atrial fibrillation is presenting for TKA. She is currently 3 days post-op. Patient is on chronic anticoagulation with warfarin, which has been on hold in anticipation of surgery - last  dose was 5/30. CHA2DS2VASc is at least 7 (HTN, age+2, TIA, PVD, female sex). Pharmacy has been consulted to manage warfarin.  Pertinent medications: Warfarin 2mg  PO daily on Tuesdays and Wednesdays Warfarin 1mg  PO daily Monday, Thursday, Friday, Saturday, Sunday Total weekly dose = 9mg   New drug-drug interactions: Celecoxib  - increased risk of bleeding with NSAIDs   Date INR Warfarin Dose  06/04 1.8 3 mg 06/05 2.1 1 mg 06/06 3.2       1 mg 06/07 3.1     Goal(s) of therapy: INR 2 - 3 Monitor platelets by anticoagulation protocol: Yes   Baseline anticoagulation labs: Recent Labs    10/14/23 0605 10/15/23 0633 10/16/23 0547  INR 2.1* 3.2* 3.1*  HGB 10.3*  --  8.9*  PLT 154  --  112*     PLAN: INR is slightly supratherapeutic this morning at 3.1. Although patient does not appear to be in an acute exacerbation, a history of HFrEF (EF 35-40% 02/2022) may increase warfarin sensitivity and INR response; inpatient diet may also be playing a role in current INR trend CBC today shows Hgb trending down 10.3>8.9 and plts trending down 154>112 Per chart review, no signs/symptoms of bleeding noted Given patient is supratherapeutic for 2 days, hold warfarin today. Plan to give 1 mg tomorrow if INR continues to trend down  Monitor INR daily while inpatient Monitor signs/symptoms of bleeding CBC at least every 7 days   Kahiau Schewe, PharmD  Candidate 10/16/2023 9:09 AM

## 2023-10-17 LAB — CBC
HCT: 25.8 % — ABNORMAL LOW (ref 36.0–46.0)
Hemoglobin: 8.3 g/dL — ABNORMAL LOW (ref 12.0–15.0)
MCH: 30.7 pg (ref 26.0–34.0)
MCHC: 32.2 g/dL (ref 30.0–36.0)
MCV: 95.6 fL (ref 80.0–100.0)
Platelets: 108 10*3/uL — ABNORMAL LOW (ref 150–400)
RBC: 2.7 MIL/uL — ABNORMAL LOW (ref 3.87–5.11)
RDW: 14.4 % (ref 11.5–15.5)
WBC: 6.7 10*3/uL (ref 4.0–10.5)
nRBC: 0 % (ref 0.0–0.2)

## 2023-10-17 LAB — PROTIME-INR
INR: 2.6 — ABNORMAL HIGH (ref 0.8–1.2)
Prothrombin Time: 28.2 s — ABNORMAL HIGH (ref 11.4–15.2)

## 2023-10-17 MED ORDER — WARFARIN SODIUM 1 MG PO TABS
1.0000 mg | ORAL_TABLET | Freq: Once | ORAL | Status: AC
  Administered 2023-10-17: 1 mg via ORAL
  Filled 2023-10-17: qty 1

## 2023-10-17 NOTE — Discharge Summary (Signed)
 Physician Discharge Summary  Subjective: 5 Days Post-Op Procedure(s) (LRB): ARTHROPLASTY, KNEE, TOTAL, USING IMAGELESS COMPUTER-ASSISTED NAVIGATION (Left) Patient reports pain as mild.   Patient seen in rounds with Dr. Aubry Blase. Patient is well, and has had no acute complaints or problems Denies any CP, SOB, N/V, fevers or chills Patient is ready to go to rehab  Physician Discharge Summary  Patient ID: Kristin Mitchell MRN: [id number] DOB/AGE: [birth year] 83 y.o.  Admit date: 10/13/2023 Discharge date: 10/18/2023  Admission Diagnoses:  Discharge Diagnoses:  Principal Problem:   History of total knee arthroplasty, left   Discharged Condition: fair  Hospital Course: Patient presented to the hospital on 10/13/2023 for an elective left total knee arthroplasty performed by Dr. Aubry Blase. Patient was given 1g of TXA and 2g of Ancef  prior to the procedure. she tolerated the procedure well without any complications. See procedural note below for details. Postoperatively, the patient struggled with PT and mobility. She did have increased BPs and was monitored for her INR for warfarin management and her relative drainage post surgically. Her JP drain was removed without any difficulty on post op day 2 and was intact. she was able to void her bladder without any difficulty. Physical exam was unremarkable. she denies any SOB, CP, N/V, fevers or chills. Vital signs are stable. Patient is stable to discharge home.  PROCEDURE:  Left total knee arthroplasty using computer-assisted navigation   SURGEON:  Maxene Span. M.D.   ASSISTANT:  Benjiman Bras, PA-C (present and scrubbed throughout the case, critical for assistance with exposure, retraction, instrumentation, and closure)   ANESTHESIA: general   ESTIMATED BLOOD LOSS: 50 mL   FLUIDS REPLACED: 750 mL of crystalloid   TOURNIQUET TIME: 100 minutes   DRAINS: 2 medium Hemovac drains   SOFT TISSUE RELEASES: Anterior cruciate ligament,  posterior cruciate ligament, deep medial collateral ligament, patellofemoral ligament   IMPLANTS UTILIZED: DePuy Attune size 6 posterior stabilized femoral component (cemented), size 5 rotating platform tibial component (cemented), 35 mm medialized dome patella (cemented), and a 6 mm stabilized rotating platform polyethylene insert.  Treatments: None  Discharge Exam: Blood pressure 117/60, pulse 72, temperature 97.6 F (36.4 C), temperature source Oral, resp. rate 18, height 5\' 2"  (1.575 m), weight 62.1 kg, SpO2 100%.   Disposition: SNF   Allergies as of 10/18/2023       Reactions   Penicillins Rash   IgE = 49 (WNL) on 10/06/2023        Medication List     TAKE these medications    acetaminophen  500 MG tablet Commonly known as: TYLENOL  Take 500 mg by mouth every 6 (six) hours as needed.   alendronate  70 MG tablet Commonly known as: FOSAMAX  Take with a full glass of water on an empty stomach.   amiodarone 200 MG tablet Commonly known as: PACERONE Take 200 mg by mouth daily.   atorvastatin  80 MG tablet Commonly known as: LIPITOR  Take 1 tablet (80 mg total) by mouth daily. What changed: how much to take   Calcium  600+D 600-800 MG-UNIT Tabs Generic drug: Calcium  Carb-Cholecalciferol  Take 1 tablet by mouth daily.   cyanocobalamin  1000 MCG tablet Commonly known as: VITAMIN B12 Take 2,500 mcg by mouth daily.   dapagliflozin propanediol 10 MG Tabs tablet Commonly known as: FARXIGA Take 1 tablet by mouth daily.   magnesium  chloride 64 MG Tbec SR tablet Commonly known as: SLOW-MAG Take 1 tablet (64 mg total) by mouth daily.   metoprolol  succinate 50 MG 24 hr tablet Commonly  known as: TOPROL -XL Take 50 mg by mouth 2 (two) times daily.   metoprolol  tartrate 100 MG tablet Commonly known as: LOPRESSOR  Take 1 tablet (100 mg total) by mouth 2 (two) times daily.   oxyCODONE  5 MG immediate release tablet Commonly known as: Oxy IR/ROXICODONE  Take 1 tablet (5 mg total)  by mouth every 4 (four) hours as needed for moderate pain (pain score 4-6) (pain score 4-6).   potassium chloride  10 MEQ tablet Commonly known as: KLOR-CON  Take 10 mEq by mouth daily.   sodium chloride  1 g tablet Take 1 tablet (1 g total) by mouth daily.   torsemide  20 MG tablet Commonly known as: DEMADEX  Take 1 tablet (20 mg total) by mouth daily.   traMADol  50 MG tablet Commonly known as: ULTRAM  Take 1-2 tablets (50-100 mg total) by mouth every 4 (four) hours as needed for moderate pain (pain score 4-6).   Vitamin D3 50 MCG (2000 UT) Tabs Take 1 tablet by mouth daily.   warfarin 1 MG tablet Commonly known as: COUMADIN  Take 1-2 mg by mouth daily.               Durable Medical Equipment  (From admission, onward)           Start     Ordered   10/13/23 1429  DME Walker rolling  Once       Question:  Patient needs a walker to treat with the following condition  Answer:  Total knee replacement status   10/13/23 1428   10/13/23 1429  DME Bedside commode  Once       Comments: Patient is not able to walk the distance required to go the bathroom, or he/she is unable to safely negotiate stairs required to access the bathroom.  A 3in1 BSC will alleviate this problem  Question:  Patient needs a bedside commode to treat with the following condition  Answer:  Total knee replacement status   10/13/23 1428            Contact information for follow-up providers     Bert Britain, PA-C Follow up on 10/27/2023.   Specialty: Orthopedic Surgery Why: at 10:30am Contact information: [address] [place] [address] [phone]         Arlyne Lame, MD Follow up on 11/25/2023.   Specialty: Orthopedic Surgery Why: at 2:00pm Contact information: [address] [facility] [place] [address] [phone]              Contact information for after-discharge care     Destination     [facility] .   Service: Skilled Nursing Contact information: [address] [place]  [address] [phone]                     Signed: Benjiman Bras 10/18/2023, 7:33 AM   Objective: Vital signs in last 24 hours: Temp:  [97.6 F (36.4 C)-98.2 F (36.8 C)] 97.6 F (36.4 C) (06/09 0541) Pulse Rate:  [66-82] 72 (06/09 0541) Resp:  [15-18] 18 (06/09 0541) BP: (111-145)/(50-60) 117/60 (06/09 0541) SpO2:  [97 %-100 %] 100 % (06/09 0541)  Intake/Output from previous day:  Intake/Output Summary (Last 24 hours) at 10/18/2023 0733 Last data filed at 10/17/2023 2031 Gross per 24 hour  Intake 480 ml  Output --  Net 480 ml    Intake/Output this shift: No intake/output data recorded.  Labs: Recent Labs  10/16/23 0547 10/17/23 0600 10/18/23 0139  HGB 8.9* 8.3* 7.9*   Recent Labs    10/17/23 0600 10/18/23 0139  WBC 6.7 5.5  RBC 2.70* 2.57*  HCT 25.8* 24.7*  PLT 108* 112*   No results for input(s): "NA", "K", "CL", "CO2", "BUN", "CREATININE", "GLUCOSE", "CALCIUM " in the last 72 hours. Recent Labs    10/17/23 0600 10/18/23 0139  INR 2.6* 2.3*    EXAM: General - Patient is Alert, Appropriate, and Oriented Extremity - Neurologically intact Neurovascular intact Sensation intact distally Intact pulses distally Dorsiflexion/Plantar flexion intact No cellulitis present Compartment soft Dressing - dressing C/D/I and no drainage Motor Function - intact, moving foot and toes well on exam.  Honeycomb intact with minimal drainage  Assessment/Plan: 5 Days Post-Op Procedure(s) (LRB): ARTHROPLASTY, KNEE, TOTAL, USING IMAGELESS COMPUTER-ASSISTED NAVIGATION (Left) Procedure(s) (LRB): ARTHROPLASTY, KNEE, TOTAL, USING IMAGELESS COMPUTER-ASSISTED NAVIGATION (Left) Past Medical History:  Diagnosis Date   Anemia    Aortic atherosclerosis (HCC)    Arthritis    Breast cancer, right (HCC) 11/26/2014   a.) s/p BILATERAL mastectomies   Cerebral  microvascular disease    CKD (chronic kidney disease), stage III (HCC)    DDD (degenerative disc disease), cervical    HFrEF (heart failure with reduced ejection fraction) (HCC)    HLD (hyperlipidemia)    HTN (hypertension)    Intermittent palpitations    LBBB (left bundle branch block)    Long term current use of amiodarone    Lumbar stenosis with neurogenic claudication    MVP (mitral valve prolapse)    OAB (overactive bladder)    On warfarin for atrial fibrillation (HCC)    Osteoporosis    Persistent atrial fibrillation (HCC)    a.) CHA2DS2VASc = 6 (age x2, sex, HFrEF, HTN, vascular disease) as of 10/12/2023; b.) rate/rhythm maintained on oral amiodarone + metoprolol ; chronically anticoagulated with warfarin   Seasonal allergies    Senile cataract    Stroke [facility])    a.) noted on brain MRI performed on 03/28/2022: chronic RIGHT parietal lobe infarcts   Valvular insufficiency    Principal Problem:   History of total knee arthroplasty, left  Estimated body mass index is 25.06 kg/m as calculated from the following:   Height as of this encounter: 5\' 2"  (1.575 m).   Weight as of this encounter: 62.1 kg.  Continue to work with PT at [facility].   Monitor bleeding symptoms  Discussed with the patient continuing to utilize Polar Care   Patient will use bone foam in 20-30 minute intervals   Patient will wear TED hose bilaterally to help prevent DVT and clot formation   Honeycomb bandage intact.  Can be replaced with honeycomb bandages that will be sent home with the patient   Discussed sending the patient home with tramadol  and oxycodone  for as needed pain management.  Patient will also be sent home with Celebrex  to help with swelling and inflammation.  Patient will take at home warfarin for DVT prophylaxis   Weight-Bearing as tolerated to left leg   Patient will follow-up with [facility] clinic orthopedics in 2 weeks for staple removal and reevaluation  Diet - Regular diet Follow  up - in 2 weeks Activity - WBAT Disposition - Home Condition Upon Discharge - Stable DVT Prophylaxis - Coumadin  and TED hose  Standley Earing, PA-C Orthopaedic Surgery 10/18/2023, 7:33 AM

## 2023-10-17 NOTE — Progress Notes (Addendum)
 Subjective: 4 Days Post-Op Procedure(s) (LRB): ARTHROPLASTY, KNEE, TOTAL, USING IMAGELESS COMPUTER-ASSISTED NAVIGATION (Left) Patient reports pain as mild and moderate.   Patient seen in rounds with Dr. Aubry Blase. Patient is well, and has had no acute complaints or problems Denies any CP, SOB, N/V, fevers or chills. We will continue with therapy today.  Plan is to go Skilled nursing facility after hospital stay.  Objective: Vital signs in last 24 hours: Temp:  [97.4 F (36.3 C)-98.2 F (36.8 C)] 97.9 F (36.6 C) (06/08 0738) Pulse Rate:  [62-76] 66 (06/08 0738) Resp:  [15-20] 15 (06/08 0738) BP: (123-162)/(52-63) 136/56 (06/08 0738) SpO2:  [96 %-100 %] 97 % (06/08 0738)  Intake/Output from previous day:  Intake/Output Summary (Last 24 hours) at 10/17/2023 0829 Last data filed at 10/16/2023 2155 Gross per 24 hour  Intake 120 ml  Output 150 ml  Net -30 ml    Intake/Output this shift: No intake/output data recorded.  Labs: Recent Labs    10/16/23 0547 10/17/23 0600  HGB 8.9* 8.3*   Recent Labs    10/16/23 0547 10/17/23 0600  WBC 7.5 6.7  RBC 2.94* 2.70*  HCT 27.7* 25.8*  PLT 112* 108*   No results for input(s): "NA", "K", "CL", "CO2", "BUN", "CREATININE", "GLUCOSE", "CALCIUM " in the last 72 hours. Recent Labs    10/16/23 0547 10/17/23 0600  INR 3.1* 2.6*    EXAM General - Patient is Alert, Appropriate, and Oriented Extremity - Neurologically intact Neurovascular intact Sensation intact distally Intact pulses distally Dorsiflexion/Plantar flexion intact No cellulitis present Compartment soft Dressing - dressing C/D/I and no drainage Motor Function - intact, moving foot and toes well on exam.  Honeycomb noted to have moderate amount of drainage, improved from yesterday. Removed, incision cleaned with alcohol and new honeycomb dressing applied. Modified Jones dressing was re-applied and the patient was encouraged to keep her knee flexed with a pillow inserted  under her knee.  Past Medical History:  Diagnosis Date   Anemia    Aortic atherosclerosis (HCC)    Arthritis    Breast cancer, right (HCC) 11/26/2014   a.) s/p BILATERAL mastectomies   Cerebral microvascular disease    CKD (chronic kidney disease), stage III (HCC)    DDD (degenerative disc disease), cervical    HFrEF (heart failure with reduced ejection fraction) (HCC)    HLD (hyperlipidemia)    HTN (hypertension)    Intermittent palpitations    LBBB (left bundle branch block)    Long term current use of amiodarone    Lumbar stenosis with neurogenic claudication    MVP (mitral valve prolapse)    OAB (overactive bladder)    On warfarin for atrial fibrillation (HCC)    Osteoporosis    Persistent atrial fibrillation (HCC)    a.) CHA2DS2VASc = 6 (age x2, sex, HFrEF, HTN, vascular disease) as of 10/12/2023; b.) rate/rhythm maintained on oral amiodarone + metoprolol ; chronically anticoagulated with warfarin   Seasonal allergies    Senile cataract    Stroke [facility])    a.) noted on brain MRI performed on 03/28/2022: chronic RIGHT parietal lobe infarcts   Valvular insufficiency     Assessment/Plan: 4 Days Post-Op Procedure(s) (LRB): ARTHROPLASTY, KNEE, TOTAL, USING IMAGELESS COMPUTER-ASSISTED NAVIGATION (Left) Principal Problem:   History of total knee arthroplasty, left  Estimated body mass index is 25.06 kg/m as calculated from the following:   Height as of this encounter: 5\' 2"  (1.575 m).   Weight as of this encounter: 62.1 kg. Advance diet Up with  therapy  Patient is currently being evaluated with pharmacy to monitor her INR and progress her warfarin per cardiology recommendations. Her INR is at 2.6 today Hgb at 8.3 today, continue to evaluate postoperative bleeding with CBC later today Patient had a new honeycomb dressing applied, and a modified Jones dressing placed. Patient was encouraged to keep her knee bent with a pillow underneath. Nursing made aware  Encouraged to  utilize incentive spirometer  Discussed with the patient continuing to utilize Polar Care, new ice added with provider visit  Patient will use bone foam in 20-30 minute intervals  Patient will wear TED hose bilaterally to help prevent DVT and clot formation  Patient will take at home warfarin for DVT prophylaxis  Weight-Bearing as tolerated to left leg  Patient will look to be DCed to [facility] tomorrow morning, will follow-up with [facility] clinic orthopedics in 2 weeks for staple removal and reevaluation  Wadie Guile, PA-C [facility] Orthopaedics 10/17/2023, 8:29 AM

## 2023-10-17 NOTE — Consult Note (Addendum)
 Pharmacy Consult Note - Anticoagulation  Pharmacy Consult for warfarin Indication: atrial fibrillation  PATIENT MEASUREMENTS: Height: 5\' 2"  (157.5 cm) Weight: 62.1 kg (137 lb) IBW/kg (Calculated) : 50.1 HEPARIN DW (KG): 62.1  VITAL SIGNS: Temp: 97.9 F (36.6 C) (06/08 0738) Temp Source: Oral (06/08 0738) BP: 136/56 (06/08 0738) Pulse Rate: 66 (06/08 0738)  Recent Labs    10/17/23 0600  HGB 8.3*  HCT 25.8*  PLT 108*  LABPROT 28.2*  INR 2.6*    Estimated Creatinine Clearance: 34.2 mL/min (A) (by C-G formula based on SCr of 1.1 mg/dL (H)).  PAST MEDICAL HISTORY: Past Medical History:  Diagnosis Date   Anemia    Aortic atherosclerosis (HCC)    Arthritis    Breast cancer, right (HCC) 11/26/2014   a.) s/p BILATERAL mastectomies   Cerebral microvascular disease    CKD (chronic kidney disease), stage III (HCC)    DDD (degenerative disc disease), cervical    HFrEF (heart failure with reduced ejection fraction) (HCC)    HLD (hyperlipidemia)    HTN (hypertension)    Intermittent palpitations    LBBB (left bundle branch block)    Long term current use of amiodarone    Lumbar stenosis with neurogenic claudication    MVP (mitral valve prolapse)    OAB (overactive bladder)    On warfarin for atrial fibrillation (HCC)    Osteoporosis    Persistent atrial fibrillation (HCC)    a.) CHA2DS2VASc = 6 (age x2, sex, HFrEF, HTN, vascular disease) as of 10/12/2023; b.) rate/rhythm maintained on oral amiodarone + metoprolol ; chronically anticoagulated with warfarin   Seasonal allergies    Senile cataract    Stroke [facility])    a.) noted on brain MRI performed on 03/28/2022: chronic RIGHT parietal lobe infarcts   Valvular insufficiency     ASSESSMENT: 83 y.o. female with PMH including osteoarthritis, atrial fibrillation is presenting for TKA. She is currently 4 days post-op. Patient is on chronic anticoagulation with warfarin, which has been on hold in anticipation of surgery - last  dose PTA was 5/30. CHA2DS2VASc is at least 7 (HTN, age+2, TIA, PVD, female sex). Pharmacy has been consulted to manage warfarin.  Pertinent medications: Warfarin 2mg  PO daily on Tuesdays and Wednesdays Warfarin 1mg  PO daily Monday, Thursday, Friday, Saturday, Sunday Total weekly dose = 9mg   New drug-drug interactions: Celecoxib  - increased risk of bleeding with NSAIDs   Date INR Warfarin Dose  06/04 1.8 3 mg 06/05 2.1 1 mg 06/06 3.2       1 mg 06/07 3.1    HELD 06/08 2.6      Goal(s) of therapy: INR 2 - 3 Monitor platelets by anticoagulation protocol: Yes   Baseline anticoagulation labs: Recent Labs    10/15/23 0633 10/16/23 0547 10/17/23 0600  INR 3.2* 3.1* 2.6*  HGB  --  8.9* 8.3*  PLT  --  112* 108*     PLAN: INR is therapeutic this morning at 2.6. CBC today shows Hgb trending down 8.9>8.3 and plts trending down 112>108 Per chart review, no signs/symptoms of bleeding noted Given patient is therapeutic today, resume home dose of warfarin 1 mg x1 Monitor INR daily while inpatient Monitor signs/symptoms of bleeding CBC at least every 7 days   Monicia Tse, PharmD Candidate 10/17/2023 8:15 AM

## 2023-10-17 NOTE — Progress Notes (Signed)
 Physical Therapy Treatment Patient Details Name: Kristin Mitchell MRN: [id number] DOB: [birth year] Today's Date: 10/17/2023   History of Present Illness Pt is a pleasant 83 y.o. female s/p L TKA on 10/13/23.    PT Comments   Pt ready for session.  To EOB with mod a x 1.  Stood and transferred to [facility] with min a x 1 for small formed BM.  Tech aware. Assist for care before walking 8' in room with very slow steady gait.  Pt left to self select gait distance and upon trying to turn at end of bed she fatigues and is unable to walk further or even take 3 steps to get to end of bed to sit.  BSC is brought up behind pt to rest.  After seated rest, she stands again and [facility] is swapped out for recliner.  Remained up in chair with towel roll under knee per discussion with RN - OK'ed by PA to allow for some knee flexion.    <3 hrs of therapy at DC remains appropriate as mobility and gait remains challenging for pt.   If plan is discharge home, recommend the following: A lot of help with walking and/or transfers;Assistance with cooking/housework;Direct supervision/assist for medications management;Direct supervision/assist for financial management;Assist for transportation;Help with stairs or ramp for entrance;Supervision due to cognitive status;A little help with bathing/dressing/bathroom   Can travel by private vehicle        Equipment Recommendations  Rolling walker (2 wheels)    Recommendations for Other Services       Precautions / Restrictions Precautions Precautions: Fall;Knee Precaution Booklet Issued: Yes (comment) Recall of Precautions/Restrictions: Impaired Precaution/Restrictions Comments: needs reminders and re-education for knee positioning Restrictions Weight Bearing Restrictions Per Provider Order: Yes LLE Weight Bearing Per Provider Order: Weight bearing as tolerated     Mobility  Bed Mobility Overal bed mobility: Needs Assistance Bed Mobility: Supine to Sit     Supine to  sit: Mod assist       Patient Response: Cooperative  Transfers Overall transfer level: Needs assistance Equipment used: Rolling walker (2 wheels) Transfers: Sit to/from Stand Sit to Stand: Min assist                Ambulation/Gait Ambulation/Gait assistance: Min Chemical engineer (Feet): 8 Feet Assistive device: Rolling walker (2 wheels) Gait Pattern/deviations: Step-to pattern, Antalgic Gait velocity: very slow         Stairs             Wheelchair Mobility     Tilt Bed Tilt Bed Patient Response: Cooperative  Modified Rankin (Stroke Patients Only)       Balance Overall balance assessment: Needs assistance Sitting-balance support: Feet supported, No upper extremity supported Sitting balance-Leahy Scale: Fair     Standing balance support: Bilateral upper extremity supported Standing balance-Leahy Scale: Fair                              Hotel manager: No apparent difficulties  Cognition Arousal: Alert Behavior During Therapy: WFL for tasks assessed/performed   PT - Cognitive impairments: Problem solving                       PT - Cognition Comments: requires cues for sequencing during mobility and managing RW Following commands: Intact      Cueing Cueing Techniques: Verbal cues, Tactile cues  Exercises      General Comments  Pertinent Vitals/Pain Pain Assessment Pain Assessment: Faces Faces Pain Scale: Hurts a little bit Pain Location: L knee Pain Descriptors / Indicators: Aching, Discomfort Pain Intervention(s): Limited activity within patient's tolerance, Monitored during session, Premedicated before session, Repositioned, Ice applied    Home Living                          Prior Function            PT Goals (current goals can now be found in the care plan section) Progress towards PT goals: Progressing toward goals    Frequency    BID      PT  Plan      Co-evaluation              AM-PAC PT "6 Clicks" Mobility   Outcome Measure  Help needed turning from your back to your side while in a flat bed without using bedrails?: A Lot Help needed moving from lying on your back to sitting on the side of a flat bed without using bedrails?: A Lot Help needed moving to and from a bed to a chair (including a wheelchair)?: A Little Help needed standing up from a chair using your arms (e.g., wheelchair or bedside chair)?: A Little Help needed to walk in hospital room?: A Little Help needed climbing 3-5 steps with a railing? : Total 6 Click Score: 14    End of Session Equipment Utilized During Treatment: Gait belt Activity Tolerance: Patient limited by fatigue;Patient tolerated treatment well Patient left: in chair;with chair alarm set;with call bell/phone within reach Nurse Communication: Mobility status PT Visit Diagnosis: Muscle weakness (generalized) (M62.81);Difficulty in walking, not elsewhere classified (R26.2);Pain Pain - Right/Left: Left Pain - part of body: Knee     Time: 1610-9604 PT Time Calculation (min) (ACUTE ONLY): 28 min  Charges:    $Gait Training: 8-22 mins $Therapeutic Activity: 8-22 mins PT General Charges $$ ACUTE PT VISIT: 1 Visit                   Charlanne Cong, PTA 10/17/23, 12:13 PM

## 2023-10-18 LAB — CBC
HCT: 24.7 % — ABNORMAL LOW (ref 36.0–46.0)
Hemoglobin: 7.9 g/dL — ABNORMAL LOW (ref 12.0–15.0)
MCH: 30.7 pg (ref 26.0–34.0)
MCHC: 32 g/dL (ref 30.0–36.0)
MCV: 96.1 fL (ref 80.0–100.0)
Platelets: 112 10*3/uL — ABNORMAL LOW (ref 150–400)
RBC: 2.57 MIL/uL — ABNORMAL LOW (ref 3.87–5.11)
RDW: 14.4 % (ref 11.5–15.5)
WBC: 5.5 10*3/uL (ref 4.0–10.5)
nRBC: 0 % (ref 0.0–0.2)

## 2023-10-18 LAB — PROTIME-INR
INR: 2.3 — ABNORMAL HIGH (ref 0.8–1.2)
Prothrombin Time: 25.3 s — ABNORMAL HIGH (ref 11.4–15.2)

## 2023-10-18 MED ORDER — TRAMADOL HCL 50 MG PO TABS: 50.0000 mg | ORAL_TABLET | ORAL | 0 refills | Status: AC | PRN

## 2023-10-18 MED ORDER — OXYCODONE HCL 5 MG PO TABS
5.0000 mg | ORAL_TABLET | ORAL | 0 refills | Status: AC | PRN
Start: 2023-10-18 — End: ?

## 2023-10-18 NOTE — Progress Notes (Signed)
 The Clinical research associate called [facility] and gave report to [place], LPN. Pt is has no complaints of pain or discomfort. Will continue to monitor until discharge.

## 2023-10-18 NOTE — Progress Notes (Signed)
 Physical Therapy Treatment Patient Details Name: Kristin Mitchell MRN: [id number] DOB: May 13, 1940 Today's Date: 10/18/2023   History of Present Illness Pt is a pleasant 83 y.o. female s/p L TKA on 10/13/23.    PT Comments  OOB to [facility] to void then transitions to recliner.  Standing and seated AROM and stretching.  Pt fatigued with activity and declined further formal gait at this time.  Remained in recliner with needs met.   If plan is discharge home, recommend the following: A lot of help with walking and/or transfers;Assistance with cooking/housework;Direct supervision/assist for medications management;Direct supervision/assist for financial management;Assist for transportation;Help with stairs or ramp for entrance;Supervision due to cognitive status;A little help with bathing/dressing/bathroom   Can travel by private vehicle        Equipment Recommendations  Rolling walker (2 wheels)    Recommendations for Other Services       Precautions / Restrictions Precautions Precautions: Fall;Knee Precaution Booklet Issued: Yes (comment) Recall of Precautions/Restrictions: Impaired Precaution/Restrictions Comments: needs reminders and re-education for knee positioning Restrictions Weight Bearing Restrictions Per Provider Order: Yes LLE Weight Bearing Per Provider Order: Weight bearing as tolerated     Mobility  Bed Mobility Overal bed mobility: Needs Assistance Bed Mobility: Supine to Sit     Supine to sit: Min assist, Mod assist     General bed mobility comments: better today with only help getting hips fully to edge of bed Patient Response: Cooperative  Transfers Overall transfer level: Needs assistance Equipment used: Rolling walker (2 wheels)   Sit to Stand: Min assist                Ambulation/Gait Ambulation/Gait assistance: Min assist Gait Distance (Feet): 6 Feet Assistive device: Rolling walker (2 wheels) Gait Pattern/deviations: Step-to pattern,  Antalgic Gait velocity: very slow     General Gait Details: functional gait in room to [facility] then recliner.  declined further gait due to fatigue from activities and anticipation of transfer to SNF today   Stairs             Wheelchair Mobility     Tilt Bed Tilt Bed Patient Response: Cooperative  Modified Rankin (Stroke Patients Only)       Balance Overall balance assessment: Needs assistance Sitting-balance support: Feet supported, No upper extremity supported Sitting balance-Leahy Scale: Fair     Standing balance support: Bilateral upper extremity supported Standing balance-Leahy Scale: Fair                              Hotel manager: No apparent difficulties  Cognition Arousal: Alert Behavior During Therapy: WFL for tasks assessed/performed   PT - Cognitive impairments: Problem solving                       PT - Cognition Comments: encouragement given but overall improving Following commands: Intact      Cueing Cueing Techniques: Verbal cues, Tactile cues  Exercises Total Joint Exercises Goniometric ROM: 0-93 Other Exercises Other Exercises: seated and standing ex focusing on L knee    General Comments        Pertinent Vitals/Pain Pain Assessment Pain Assessment: Faces Faces Pain Scale: Hurts little more Pain Location: L knee Pain Descriptors / Indicators: Aching, Discomfort Pain Intervention(s): Limited activity within patient's tolerance, Monitored during session, Repositioned, Ice applied    Home Living  Prior Function            PT Goals (current goals can now be found in the care plan section) Progress towards PT goals: Progressing toward goals    Frequency    BID      PT Plan      Co-evaluation              AM-PAC PT "6 Clicks" Mobility   Outcome Measure  Help needed turning from your back to your side while in a flat bed without  using bedrails?: A Lot Help needed moving from lying on your back to sitting on the side of a flat bed without using bedrails?: A Lot Help needed moving to and from a bed to a chair (including a wheelchair)?: A Little Help needed standing up from a chair using your arms (e.g., wheelchair or bedside chair)?: A Little Help needed to walk in hospital room?: A Little Help needed climbing 3-5 steps with a railing? : A Lot 6 Click Score: 15    End of Session Equipment Utilized During Treatment: Gait belt Activity Tolerance: Patient limited by fatigue;Patient tolerated treatment well Patient left: in chair;with chair alarm set;with call bell/phone within reach Nurse Communication: Mobility status PT Visit Diagnosis: Muscle weakness (generalized) (M62.81);Difficulty in walking, not elsewhere classified (R26.2);Pain Pain - Right/Left: Left Pain - part of body: Knee     Time: 0851-0907 PT Time Calculation (min) (ACUTE ONLY): 16 min  Charges:    $Gait Training: 8-22 mins PT General Charges $$ ACUTE PT VISIT: 1 Visit                   Charlanne Cong, PTA 10/18/23, 9:24 AM

## 2023-10-18 NOTE — TOC Progression Note (Signed)
 Transition of Care [facility]) - Progression Note    Patient Details  Name: Neria Procter MRN: [id number] Date of Birth: 1940/06/07  [facility]) CM/SW Contact  Alexandra Ice, RN Phone Number: 10/18/2023, 8:34 AM  Clinical Narrative:    Discharge summary and orders sent to facility via HUB. Sent Ricky message notifying patient has discharge orders and summary. He stated when he gets in office he will review and let me know bed assignment.    Expected Discharge Plan: Skilled Nursing Facility    Expected Discharge Plan and Services   Discharge Planning Services: CM Consult Post Acute Care Choice: Skilled Nursing Facility Living arrangements for the past 2 months: Single Family Home Expected Discharge Date: 10/18/23                 DME Agency: NA       HH Arranged: NA           Social Determinants of Health (SDOH) Interventions SDOH Screenings   Food Insecurity: No Food Insecurity (10/13/2023)  Housing: Low Risk  (10/13/2023)  Transportation Needs: No Transportation Needs (10/13/2023)  Utilities: Not At Risk (10/13/2023)  Financial Resource Strain: Low Risk  (04/20/2023)   Received from [facility]  Social Connections: Socially Integrated (10/13/2023)  Tobacco Use: Low Risk  (10/13/2023)    Readmission Risk Interventions     No data to display

## 2023-10-18 NOTE — TOC Transition Note (Signed)
 Transition of Care [facility]) - Discharge Note   Patient Details  Name: Kristin Mitchell MRN: [id number] Date of Birth: Sep 20, 1940  Transition of Care [facility]) CM/SW Contact:  Alexandra Ice, RN Phone Number: 10/18/2023, 9:41 AM   Clinical Narrative:    Patient going to room E-6, nurse to call report to [phone]. TOC spoke with ED at [facility], scheduled pick up for 11:30am. EMS packet printed to nurse station. Notified bedside nurse.    Final next level of care: Skilled Nursing Facility Barriers to Discharge: Barriers Resolved   Patient Goals and CMS Choice Patient states their goals for this hospitalization and ongoing recovery are:: I have no help at home, I want to go to [facility] CMS Medicare.gov Compare Post Acute Care list provided to:: Patient Choice offered to / list presented to : Patient      Discharge Placement              Patient chooses bed at: Other - please specify in the comment section below: [facility]) Patient to be transferred to facility by: [facility] Name of family member notified: patient Patient and family notified of of transfer: 10/18/23  Discharge Plan and Services Additional resources added to the After Visit Summary for     Discharge Planning Services: CM Consult Post Acute Care Choice: Skilled Nursing Facility            DME Agency: NA       HH Arranged: NA          Social Drivers of Health (SDOH) Interventions SDOH Screenings   Food Insecurity: No Food Insecurity (10/13/2023)  Housing: Low Risk  (10/13/2023)  Transportation Needs: No Transportation Needs (10/13/2023)  Utilities: Not At Risk (10/13/2023)  Financial Resource Strain: Low Risk  (04/20/2023)   Received from [facility]  Social Connections: Socially Integrated (10/13/2023)  Tobacco Use: Low Risk  (10/13/2023)     Readmission Risk Interventions     No data to display

## 2023-10-26 ENCOUNTER — Ambulatory Visit (INDEPENDENT_AMBULATORY_CARE_PROVIDER_SITE_OTHER): Payer: Medicare Other | Admitting: Vascular Surgery

## 2023-11-25 NOTE — Progress Notes (Signed)
 Review of Systems  Constitutional: Negative.   HENT: Negative.    Eyes: Negative.   Respiratory: Negative.    Cardiovascular: Negative.   Gastrointestinal: Negative.   Endocrine: Negative.   Genitourinary: Negative.   Musculoskeletal: Negative.   Skin: Negative.   Allergic/Immunologic: Negative.   Neurological:  Positive for dizziness.  Hematological:  Bruises/bleeds easily.  Psychiatric/Behavioral: Negative.

## 2023-11-28 NOTE — Progress Notes (Signed)
 Chief Complaint: Chief Complaint  Patient presents with  . Post Operative Visit    6 WEEKS PO; LEFT TKA- 10/13/2023-[facility]     Reason for Visit: The patient is a 83 y.o. female who returns today for reevaluation of her left knee. She is 6 weeks status post left total knee arthroplasty. She denies any problems with the surgical incision. She denies any significant pain, swelling, locking, or giving way of the knee. She presents today in a wheelchair.  She has continued with physical therapy at [facility].   Medications: Current Outpatient Medications  Medication Sig Dispense Refill  . acetaminophen  (TYLENOL ) 650 MG ER tablet Take 1 tablet (650 mg total) by mouth 2 (two) times daily as needed    . alendronate  (FOSAMAX ) 70 MG tablet TAKE 1 TABLET BY MOUTH ONE TIME PER WEEK 12 tablet 1  . AMIOdarone (PACERONE) 200 MG tablet TAKE 1 TABLET BY MOUTH EVERY DAY 90 tablet 1  . calcium  carbonate-vitamin D3 (OS-CAL 500+D) 500 mg(1,250mg ) -200 unit tablet Take 1 tablet by mouth once daily       . cholecalciferol  (VITAMIN D3) 2,000 unit capsule Take 2,000 Units by mouth once daily    . cyanocobalamin  (VITAMIN B12) 1000 MCG tablet Take 1,000 mcg by mouth once daily    . cyanocobalamin  (VITAMIN B12) 1000 MCG tablet Take 2,500 mcg by mouth once daily    . dapagliflozin propanediol (FARXIGA) 10 mg tablet Take 1 tablet (10 mg total) by mouth once daily 30 tablet 11  . magnesium  chloride (SLOW-MAG) 71.5 mg DR tablet Take 2 tablets by mouth 2 (two) times daily 360 tablet 11  . metoprolol  succinate (TOPROL -XL) 50 MG XL tablet TAKE 1 TABLET BY MOUTH TWICE A DAY 180 tablet 3  . oxyCODONE  (ROXICODONE ) 5 MG immediate release tablet Take 5 mg by mouth every 4 (four) hours as needed    . potassium chloride  (KLOR-CON ) 10 MEQ ER tablet TAKE 1 TABLET BY MOUTH ONCE DAILY 90 tablet 3  . sodium chloride  1,000 mg soluble tablet Take 1 tablet (1 g total) by mouth 2 (two) times daily 180 tablet 1  . traMADoL   (ULTRAM ) 50 mg tablet Take 50-100 mg by mouth every 4 (four) hours as needed    . warfarin (COUMADIN ) 2 MG tablet TAKE 1 TABLET BY MOUTH ONCE DAILY. 90 tablet 1  . warfarin (COUMADIN ) 5 MG tablet Take 5 mg by mouth once daily    . warfarin (COUMADIN ) 1 MG tablet Take 1 tablet (1 mg total) by mouth as directed 90 tablet 3  . warfarin (COUMADIN ) 2.5 MG tablet Take 2.5 mg by mouth once daily     No current facility-administered medications for this visit.    Allergies: Allergies  Allergen Reactions  . Penicillins Rash    IgE = 49 (WNL) on 10/06/2023    Past Medical History: Past Medical History:  Diagnosis Date  . Allergic state    seasonal   . Breast cancer (CMS/HHS-HCC)   . Breast cancer of upper-outer quadrant of right female breast (CMS/HHS-HCC) 11/26/2014   Last Assessment & Plan: Formatting of this note might be different from the original. History of breast cancer.  . Cataract cortical, senile   . Chronic diastolic CHF (congestive heart failure) (CMS/HHS-HCC) 01/15/2022  . CKD (chronic kidney disease) stage 2, GFR 60-89 ml/min 12/30/2021  . Closed dislocation of right shoulder 03/28/2022  . Essential hypertension 05/10/2015  . Fall at home, initial encounter 01/15/2022   Last Assessment & Plan:  Formatting of this note might be different from the original. - Fall precaution -PT/OT  . H/O bilateral mastectomy 05/01/2014  . History of cancer of right breast 05/01/2014  . Hyperlipidemia   . Hypertension   . Lumbar stenosis without neurogenic claudication 01/11/2020  . Myocardial injury 01/15/2022   Last Assessment & Plan: Formatting of this note might be different from the original. Myocardial injury due to A-fib with RVR and sepsis. Troponin level 24 -->  63 -Trend troponin -Check FLP -A1c was 5.2 on 12/28/2021, will not repeat today -Hold Lipitor  due to abnormal liver function - will not start ASA since pt is on coumadin .  . Normocytic anemia 12/07/2019  . Osteoarthritis   .  Osteoporosis 07/06/2017  . Paroxysmal atrial fibrillation with RVR (CMS/HHS-HCC) 10/14/2021   Last Assessment & Plan: Formatting of this note might be different from the original. Toprol -XL for rate control and Coumadin  dosing. Last Assessment & Plan: Formatting of this note might be different from the original. HR is up to 135, improving,  HR is 70s now on Cardizem  gtt. -will place in PCU for obs -cardizem  gtt -Continue metoprolol  -Continue Coumadin  per pharma    . Premature atrial contractions 01/06/2017  . Premature ventricular contractions 01/06/2017  . TIA (transient ischemic attack) 12/28/2021   Last Assessment & Plan: Formatting of this note might be different from the original. Patient had 2 episodes of slurred speech and facial droop.  Symptoms resolved.  Seen by neurology and he recommended Coumadin  alone without any antiplatelet agents or bridge while INR comes up.  Patient had a subtherapeutic INR of 1.3 on presentation and 1.5 upon discharge.  Echocardiogram normal. Carotid ultraso    Past Surgical History: Past Surgical History:  Procedure Laterality Date  . FRACTURE SURGERY  07/04/2018   CLO TX mandible FX W/Interdental Fixa  . removal of implant  08/29/2018   Removal of implant ( superficial wire pin or rod )  . Left total knee arthroplasty  10/13/2023   Dr. Mardee  . CATARACT EXTRACTION Bilateral   . CHOLECYSTECTOMY    . CORRECTION HAMMER TOE    . HYSTERECTOMY     total   . MASTECTOMY     cancer on right but had both removed    Social History: Social History   Socioeconomic History  . Marital status: Married    Spouse name: Carlin Ned  . Number of children: 0  . Years of education: 36  . Highest education level: High school graduate  Occupational History  . Occupation: RetiredMerchant navy officer  Tobacco Use  . Smoking status: Never  . Smokeless tobacco: Never  Vaping Use  . Vaping status: Never Used  Substance and Sexual Activity  . Alcohol use: Never  . Drug  use: No  . Sexual activity: Defer    Partners: Male   Social Drivers of Health   Financial Resource Strain: Low Risk  (04/20/2023)   Overall Financial Resource Strain (CARDIA)   . Difficulty of Paying Living Expenses: Not hard at all  Food Insecurity: No Food Insecurity (10/13/2023)   Received [facility]   Hunger Vital Sign   . Within the past 12 months, you worried that your food would run out before you got the money to buy more.: Never true   . Within the past 12 months, the food you bought just didn't last and you didn't have money to get more.: Never true  Transportation Needs: No Transportation Needs (10/13/2023)   [facility]   PRAPARE - Transportation   . Lack of Transportation (Medical): No   . Lack of Transportation (Non-Medical): No  Social Connections: Socially Integrated (10/13/2023)   Received from [facility]   Social Connection and Isolation Panel   . In a typical week, how many times do you talk on the phone with family, friends, or neighbors?: More than three times a week   . How often do you get together with friends or relatives?: More than three times a week   . How often do you attend church or religious services?: More than 4 times per year   . Do you belong to any clubs or organizations such as church groups, unions, fraternal or athletic groups, or school groups?: Yes   . How often do you attend meetings of the clubs or organizations you belong to?: More than 4 times per year   . Are you married, widowed, divorced, separated, never married, or living with a partner?: Married  Housing Stability: Low Risk  (07/22/2023)   Housing Stability Vital Sign   . Unable to Pay for Housing in the Last Year: No   . Number of Times Moved in the Last Year: 0   . Homeless in the Last Year: No    Family History: Family History  Problem Relation Name Age of Onset  . Parkinsonism Mother    . Hodgkin's lymphoma Father    . Prostate cancer Brother    . Colon  cancer Neg Hx      Review of Systems: A comprehensive 14 point ROS was performed, reviewed, and the pertinent orthopaedic findings are documented in the HPI.  Exam BP 106/70   Ht 157.5 cm (5' 2)   Wt 64.4 kg (142 lb)   LMP  (LMP Unknown) Comment: Hysterectomy  BMI 25.97 kg/m   General:  Well-developed, well-nourished female seen in no acute distress.  Gait was not assessed since the patient presented in a wheelchair.   Left  Knee:    Soft tissue swelling: minimal    Effusion:  none    Erythema:  none    Crepitance:  none   Tenderness:  No focal tenderness     Alignment:  normal    Mediolateral laxity: stable    Patellar tracking: Good tracking without evidence of subluxation or tilt    Atrophy:  No significant atrophy.  Quadriceps tone was fair to good.    Range of motion: 0/0/118 degrees  Vascular: Peripheral pulses are palpable.  Good capillary refill.  No gross pretibial or ankle edema. Homans test is negative.  Neurologic:  Awake, alert, and oriented.  Sensory function is intact to pinprick and light touch.   Motor strength is judged to be 5/5.   Motor coordination is grossly within normal limits.   No apparent clonus. No tremor.    Radiographs: I ordered and interpreted standing AP, lateral, and sunrise views of the left knee that were obtained in the office today. Good position of the total knee implants. Good alignment is noted on the AP view. Good cement mantle is appreciated without evidence of loosening. No evidence of polyethylene wear or osteolysis. No evidence of fracture or dislocation.    Impression: Left total knee arthroplasty  Plan:   The findings were discussed in detail with the patient. Physical therapy notes from [facility] were not available for review today The importance of a conscientious home exercise program was reviewed with the patient.  I spent a total of 25  minutes in both face-to-face and non-face-to-face activities, excluding  procedures performed, for this visit on the date of this encounter.   SPECIAL EQUIPMENT: None SPECIAL STUDIES: None ACTIVITIES:  As tolerated. WORK STATUS: Not applicable. THERAPY: Physical therapy for range of motion, strengthening, and gait training. Home exercise program as reviewed with the patient. MEDICATIONS: Requested Prescriptions    No prescriptions requested or ordered in this encounter   FOLLOW-UP: Return in about 6 weeks (around 01/06/2024).   James P. Hooten, Jr., M.D.  This note was generated in part with voice recognition software and I apologize for any typographical errors that were not detected and corrected.

## 2023-12-08 ENCOUNTER — Encounter: Payer: Self-pay | Admitting: Emergency Medicine

## 2023-12-08 ENCOUNTER — Emergency Department
Admission: EM | Admit: 2023-12-08 | Discharge: 2023-12-08 | Disposition: A | Discharge status: Home / Self Care | Attending: Emergency Medicine | Admitting: Emergency Medicine

## 2023-12-08 ENCOUNTER — Other Ambulatory Visit: Payer: Self-pay

## 2023-12-08 DIAGNOSIS — R55 Syncope and collapse: Secondary | ICD-10-CM | POA: Diagnosis present

## 2023-12-08 DIAGNOSIS — Z7901 Long term (current) use of anticoagulants: Secondary | ICD-10-CM | POA: Insufficient documentation

## 2023-12-08 DIAGNOSIS — R7401 Elevation of levels of liver transaminase levels: Secondary | ICD-10-CM | POA: Insufficient documentation

## 2023-12-08 DIAGNOSIS — I10 Essential (primary) hypertension: Secondary | ICD-10-CM | POA: Diagnosis not present

## 2023-12-08 LAB — COMPREHENSIVE METABOLIC PANEL WITH GFR
ALT: 94 U/L — ABNORMAL HIGH (ref 0–44)
AST: 176 U/L — ABNORMAL HIGH (ref 15–41)
Albumin: 2.7 g/dL — ABNORMAL LOW (ref 3.5–5.0)
Alkaline Phosphatase: 287 U/L — ABNORMAL HIGH (ref 38–126)
Anion gap: 10 (ref 5–15)
BUN: 34 mg/dL — ABNORMAL HIGH (ref 8–23)
CO2: 25 mmol/L (ref 22–32)
Calcium: 9 mg/dL (ref 8.9–10.3)
Chloride: 100 mmol/L (ref 98–111)
Creatinine, Ser: 1.66 mg/dL — ABNORMAL HIGH (ref 0.44–1.00)
GFR, Estimated: 30 mL/min — ABNORMAL LOW (ref >60)
Glucose, Bld: 101 mg/dL — ABNORMAL HIGH (ref 70–99)
Potassium: 3.9 mmol/L (ref 3.5–5.1)
Sodium: 135 mmol/L (ref 135–145)
Total Bilirubin: 0.8 mg/dL (ref 0.0–1.2)
Total Protein: 6.6 g/dL (ref 6.5–8.1)

## 2023-12-08 LAB — CBC WITH DIFFERENTIAL/PLATELET
Abs Immature Granulocytes: 0.03 K/uL (ref 0.00–0.07)
Basophils Absolute: 0.1 K/uL (ref 0.0–0.1)
Basophils Relative: 1 %
Eosinophils Absolute: 0.2 K/uL (ref 0.0–0.5)
Eosinophils Relative: 3 %
HCT: 31.3 % — ABNORMAL LOW (ref 36.0–46.0)
Hemoglobin: 10 g/dL — ABNORMAL LOW (ref 12.0–15.0)
Immature Granulocytes: 0 %
Lymphocytes Relative: 13 %
Lymphs Abs: 1 K/uL (ref 0.7–4.0)
MCH: 29.9 pg (ref 26.0–34.0)
MCHC: 31.9 g/dL (ref 30.0–36.0)
MCV: 93.4 fL (ref 80.0–100.0)
Monocytes Absolute: 1.1 K/uL — ABNORMAL HIGH (ref 0.1–1.0)
Monocytes Relative: 14 %
Neutro Abs: 5.3 K/uL (ref 1.7–7.7)
Neutrophils Relative %: 69 %
Platelets: 167 K/uL (ref 150–400)
RBC: 3.35 MIL/uL — ABNORMAL LOW (ref 3.87–5.11)
RDW: 15.1 % (ref 11.5–15.5)
WBC: 7.7 K/uL (ref 4.0–10.5)
nRBC: 0 % (ref 0.0–0.2)

## 2023-12-08 LAB — TROPONIN I (HIGH SENSITIVITY)
Troponin I (High Sensitivity): 41 ng/L — ABNORMAL HIGH (ref <18)
Troponin I (High Sensitivity): 35 ng/L — ABNORMAL HIGH (ref <18)

## 2023-12-08 MED ORDER — SODIUM CHLORIDE 0.9 % IV BOLUS
500.0000 mL | Freq: Once | INTRAVENOUS | Status: AC
  Administered 2023-12-08: 500 mL via INTRAVENOUS

## 2023-12-08 NOTE — ED Notes (Signed)
 Pt taken to [facility] by Maude Hsu

## 2023-12-08 NOTE — Discharge Instructions (Addendum)
 You were seen in the ER today for evaluation after an episode of passing out.  We did not find an obvious cause for this, but as we discussed, your stress enzyme was slightly elevated.  Is very important that you arrange close follow-up with a cardiologist for further evaluation.  Your liver enzymes were also slightly elevated, please follow-up with your primary care doctor for this.  Return to the ER for any new or worsening symptoms including chest pain, shortness of breath, recurrent syncope, or any other new or concerning symptoms.

## 2023-12-08 NOTE — ED Provider Notes (Signed)
 [facility] Provider Note    Event Date/Time   First MD Initiated Contact with Patient 12/08/23 1104     (approximate)   History   Loss of Consciousness   HPI  Kameron Melenie Minniear is a 83 year old female with history of HTN, A-fib, mitral regurg presenting to the emergency department for evaluation following a syncopal episode.  [place] [employer] in her [place] getting her nails done when she began to feel lightheaded.  She was sitting down and held her head down, but then had a witnessed syncopal episode.  No head injury.  She was taken back to her room.  Staff did recommend ER evaluation.  With EMS, she was found to have positive orthostatics.  She reports normal recent p.o. intake.  Denies any recent chest pain, shortness of breath.  Reports she has had some similar episodes intermittently in the past, unclear cause.       Physical Exam   Triage Vital Signs: ED Triage Vitals  Encounter Vitals Group     BP 12/08/23 1103 137/65     Girls Systolic BP Percentile --      Girls Diastolic BP Percentile --      Boys Systolic BP Percentile --      Boys Diastolic BP Percentile --      Pulse Rate 12/08/23 1103 71     Resp 12/08/23 1103 18     Temp 12/08/23 1105 97.9 F (36.6 C)     Temp Source 12/08/23 1105 Oral     SpO2 12/08/23 1103 100 %     Weight 12/08/23 1103 136 lb 11 oz (62 kg)     Height 12/08/23 1103 5' 2 (1.575 m)     Head Circumference --      Peak Flow --      Pain Score 12/08/23 1104 0     Pain Loc --      Pain Education --      Exclude from Growth Chart --     Most recent vital signs: Vitals:   12/08/23 1105 12/08/23 1400  BP:  (!) 145/60  Pulse:  62  Resp:  12  Temp: 97.9 F (36.6 C)   SpO2:  100%     General: Awake, interactive  CV:  Regular rate, good peripheral perfusion.  Resp:  Unlabored respirations, lungs clear to auscultation Abd:  Nondistended, soft, nontender Neuro:  Symmetric facial  movement, fluid speech, moving all extremity spontaneously and equally  ED Results / Procedures / Treatments   Labs (all labs ordered are listed, but only abnormal results are displayed) Labs Reviewed  CBC WITH DIFFERENTIAL/PLATELET - Abnormal; Notable for the following components:      Result Value   RBC 3.35 (*)    Hemoglobin 10.0 (*)    HCT 31.3 (*)    Monocytes Absolute 1.1 (*)    All other components within normal limits  COMPREHENSIVE METABOLIC PANEL WITH GFR - Abnormal; Notable for the following components:   Glucose, Bld 101 (*)    BUN 34 (*)    Creatinine, Ser 1.66 (*)    Albumin 2.7 (*)    AST 176 (*)    ALT 94 (*)    Alkaline Phosphatase 287 (*)    GFR, Estimated 30 (*)    All other components within normal limits  TROPONIN I (HIGH SENSITIVITY) - Abnormal; Notable for the following components:   Troponin I (High Sensitivity) 41 (*)  All other components within normal limits  TROPONIN I (HIGH SENSITIVITY) - Abnormal; Notable for the following components:   Troponin I (High Sensitivity) 35 (*)    All other components within normal limits     EKG EKG independently reviewed and interpreted by myself demonstrates:  EKG demonstrates sinus rhythm at a rate of 71, PR 239, QRS 156, QTc 499, nonspecific ST changes, no STEMI  RADIOLOGY Imaging independently reviewed and interpreted by myself demonstrates:   Formal Radiology Read:  No results found.  PROCEDURES:  Critical Care performed: No  Procedures   MEDICATIONS ORDERED IN ED: Medications  sodium chloride  0.9 % bolus 500 mL (0 mLs Intravenous Stopped 12/08/23 1333)     IMPRESSION / MDM / ASSESSMENT AND PLAN / ED COURSE  I reviewed the triage vital signs and the nursing notes.  Differential diagnosis includes, but is not limited to, vasovagal syncope, anemia, arrhythmia, electrolyte abnormality, ACS, low suspicion PE, dissection, intracranial bleed given absence of chest pain, shortness of breath, return  to baseline  Patient's presentation is most consistent with acute presentation with potential threat to life or bodily function.  83 year old female presenting to the ER for evaluation following a syncopal episode.  At baseline without evidence of trauma or new focal neurologic deficits on exam.  Clinical history suggestive of possible vasovagal syncope though precipitant not completely clear.  Does have a history of CHF, but with positive orthostatics, will trial small fluid bolus.  Will obtain labs to further evaluate.  Labs with normal white blood cell count, stable to improved hemoglobin, CMP with mild AKI, and transaminitis, but normal total bilirubin.  Patient without right upper quadrant abdominal pain.  Initial troponin 41, 35 on repeat.  Patient was updated on results of her workup.  I did discuss admission given her age, no clear precipitant for her syncopal episode, lab abnormalities.  Patient reports feeling much improved.  She does not wish to stay for further workup or admission.  She does demonstrate decision-making capacity.  She has not had any further syncopal or near syncopal episodes here and has remained stable.  She does have a cardiologist and reports she will arrange close follow-up.  Strict return precautions were provided.  Patient was discharged in stable condition.       FINAL CLINICAL IMPRESSION(S) / ED DIAGNOSES   Final diagnoses:  Syncope and collapse  Transaminitis     Rx / DC Orders   ED Discharge Orders     None        Note:  This document was prepared using Dragon voice recognition software and may include unintentional dictation errors.   Levander Slate, MD 12/08/23 [phone]

## 2023-12-08 NOTE — ED Triage Notes (Signed)
 Patient to ED via [facility] from [facility] after a syncopal episode from a sitting position while waiting to getting her nails done. PT denies complaints at this time. EMS reports +orthostatic. Hx afib  113/52 124 cbg 68 HR 100% RA
